# Patient Record
Sex: Female | Born: 1990 | Race: White | Hispanic: No | Marital: Married | Smoking: Current every day smoker
Health system: Southern US, Community
[De-identification: ages and names within clinical notes are randomized; demographics above are authoritative.]

## PROBLEM LIST (undated history)

## (undated) ENCOUNTER — Inpatient Hospital Stay (HOSPITAL_COMMUNITY): Payer: Self-pay

## (undated) DIAGNOSIS — O039 Complete or unspecified spontaneous abortion without complication: Secondary | ICD-10-CM

## (undated) DIAGNOSIS — M797 Fibromyalgia: Secondary | ICD-10-CM

## (undated) DIAGNOSIS — D649 Anemia, unspecified: Secondary | ICD-10-CM

## (undated) DIAGNOSIS — F32A Depression, unspecified: Secondary | ICD-10-CM

## (undated) DIAGNOSIS — E063 Autoimmune thyroiditis: Secondary | ICD-10-CM

## (undated) DIAGNOSIS — R51 Headache: Secondary | ICD-10-CM

## (undated) DIAGNOSIS — G629 Polyneuropathy, unspecified: Secondary | ICD-10-CM

## (undated) DIAGNOSIS — I341 Nonrheumatic mitral (valve) prolapse: Secondary | ICD-10-CM

## (undated) DIAGNOSIS — K831 Obstruction of bile duct: Secondary | ICD-10-CM

## (undated) DIAGNOSIS — O26649 Intrahepatic cholestasis of pregnancy, unspecified trimester: Secondary | ICD-10-CM

## (undated) DIAGNOSIS — N189 Chronic kidney disease, unspecified: Secondary | ICD-10-CM

## (undated) DIAGNOSIS — E039 Hypothyroidism, unspecified: Secondary | ICD-10-CM

## (undated) DIAGNOSIS — M069 Rheumatoid arthritis, unspecified: Secondary | ICD-10-CM

## (undated) DIAGNOSIS — O26619 Liver and biliary tract disorders in pregnancy, unspecified trimester: Secondary | ICD-10-CM

## (undated) DIAGNOSIS — R300 Dysuria: Secondary | ICD-10-CM

## (undated) DIAGNOSIS — F419 Anxiety disorder, unspecified: Secondary | ICD-10-CM

## (undated) DIAGNOSIS — F329 Major depressive disorder, single episode, unspecified: Secondary | ICD-10-CM

## (undated) HISTORY — DX: Dysuria: R30.0

## (undated) HISTORY — DX: Anemia, unspecified: D64.9

## (undated) HISTORY — DX: Chronic kidney disease, unspecified: N18.9

## (undated) HISTORY — DX: Autoimmune thyroiditis: E06.3

## (undated) HISTORY — PX: DILATION AND CURETTAGE OF UTERUS: SHX78

## (undated) HISTORY — DX: Headache: R51

## (undated) HISTORY — DX: Polyneuropathy, unspecified: G62.9

## (undated) HISTORY — DX: Anxiety disorder, unspecified: F41.9

## (undated) HISTORY — DX: Nonrheumatic mitral (valve) prolapse: I34.1

## (undated) HISTORY — DX: Complete or unspecified spontaneous abortion without complication: O03.9

## (undated) HISTORY — DX: Hypothyroidism, unspecified: E03.9

---
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
## 2010-01-01 ENCOUNTER — Inpatient Hospital Stay (HOSPITAL_COMMUNITY): Admission: AD | Admit: 2010-01-01 | Discharge: 2010-01-01 | Payer: Self-pay | Admitting: Obstetrics & Gynecology

## 2010-01-01 ENCOUNTER — Ambulatory Visit: Payer: Self-pay | Admitting: Advanced Practice Midwife

## 2010-01-01 ENCOUNTER — Encounter: Payer: Self-pay | Admitting: Obstetrics & Gynecology

## 2010-01-01 ENCOUNTER — Inpatient Hospital Stay (HOSPITAL_COMMUNITY): Admission: AD | Admit: 2010-01-01 | Discharge: 2010-01-03 | Payer: Self-pay | Admitting: Obstetrics & Gynecology

## 2010-02-12 ENCOUNTER — Ambulatory Visit: Payer: Self-pay | Admitting: Obstetrics and Gynecology

## 2010-02-19 ENCOUNTER — Ambulatory Visit: Payer: Self-pay | Admitting: Obstetrics and Gynecology

## 2010-02-26 ENCOUNTER — Encounter (INDEPENDENT_AMBULATORY_CARE_PROVIDER_SITE_OTHER): Payer: Self-pay | Admitting: *Deleted

## 2010-03-12 ENCOUNTER — Ambulatory Visit: Payer: Self-pay | Admitting: Obstetrics and Gynecology

## 2010-03-12 ENCOUNTER — Encounter: Payer: Self-pay | Admitting: Family Medicine

## 2010-03-13 ENCOUNTER — Emergency Department (HOSPITAL_COMMUNITY): Admission: EM | Admit: 2010-03-13 | Discharge: 2010-03-13 | Payer: Self-pay | Admitting: Emergency Medicine

## 2010-04-03 ENCOUNTER — Encounter: Payer: Self-pay | Admitting: Obstetrics & Gynecology

## 2010-07-27 HISTORY — PX: THERAPEUTIC ABORTION: SHX798

## 2010-08-26 NOTE — Letter (Signed)
Summary: Nadara Rasmus letter  [place] at [facility]  [address] [place], [place] [address]   Phone: [phone]  Fax: [phone]       02/26/2010 MRN: [id number]  EVANY SCHECTER [address] [place], [place]  [address]  Dear Ms. [facility] [place] and [place] announce the retirement of Arta Silence, M.D., from full-time practice at the [facility] office effective January 23, 2010 and his plans of returning part-time.  It is important to Dr. Hetty Ely and to our practice that you understand that [facility] has seven physicians in our office for your health care needs.  We will continue to offer the same exceptional care that you have today.    Dr. Hetty Ely has spoken to many of you about his plans for retirement and returning part-time in the fall.   We will continue to work with you through the transition to schedule appointments for you in the office and meet the high standards that [place] is committed to.   Again, it is with great pleasure that we share the news that Dr. Hetty Ely will return to [facility] in October of 2011 with a reduced schedule.    If you have any questions, or would like to request an appointment with one of our physicians, please call us at [phone] and press the option for Scheduling an appointment.  We take pleasure in providing you with excellent patient care and look forward to seeing you at your next office visit.  Our [facility] Physicians are:  Tillman Abide, M.D. Laurita Quint, M.D. Roxy Manns, M.D. Kerby Nora, M.D. Hannah Beat, M.D. Ruthe Mannan, M.D. We proudly welcomed Raechel Ache, M.D. and Eustaquio Boyden, M.D. to the practice in July/August 2011.  Sincerely,  [facility]

## 2010-10-10 LAB — DIFFERENTIAL
Basophils Absolute: 0 10*3/uL (ref 0.0–0.1)
Basophils Relative: 0 % (ref 0–1)
Eosinophils Absolute: 0.1 10*3/uL (ref 0.0–0.7)
Monocytes Absolute: 1.3 10*3/uL — ABNORMAL HIGH (ref 0.1–1.0)
Monocytes Relative: 11 % (ref 3–12)
Neutro Abs: 9.5 10*3/uL — ABNORMAL HIGH (ref 1.7–7.7)

## 2010-10-10 LAB — CBC
HCT: 34.3 % — ABNORMAL LOW (ref 36.0–46.0)
MCH: 25.3 pg — ABNORMAL LOW (ref 26.0–34.0)
MCHC: 32.1 g/dL (ref 30.0–36.0)
RDW: 17.5 % — ABNORMAL HIGH (ref 11.5–15.5)

## 2010-10-10 LAB — URINALYSIS, ROUTINE W REFLEX MICROSCOPIC
Ketones, ur: 15 mg/dL — AB
Nitrite: NEGATIVE
Protein, ur: 100 mg/dL — AB
Urobilinogen, UA: 1 mg/dL (ref 0.0–1.0)

## 2010-10-10 LAB — URINE CULTURE: Colony Count: NO GROWTH

## 2010-10-10 LAB — POCT I-STAT, CHEM 8
Chloride: 105 mEq/L (ref 96–112)
Creatinine, Ser: 0.7 mg/dL (ref 0.4–1.2)
Glucose, Bld: 93 mg/dL (ref 70–99)
HCT: 35 % — ABNORMAL LOW (ref 36.0–46.0)
Hemoglobin: 11.9 g/dL — ABNORMAL LOW (ref 12.0–15.0)
Potassium: 3.4 mEq/L — ABNORMAL LOW (ref 3.5–5.1)
Sodium: 138 mEq/L (ref 135–145)

## 2010-10-10 LAB — URINE MICROSCOPIC-ADD ON

## 2010-10-10 LAB — HEPATIC FUNCTION PANEL
AST: 25 U/L (ref 0–37)
Bilirubin, Direct: 0.1 mg/dL (ref 0.0–0.3)
Indirect Bilirubin: 0.9 mg/dL (ref 0.3–0.9)
Total Bilirubin: 1 mg/dL (ref 0.3–1.2)

## 2010-10-10 LAB — WET PREP, GENITAL
Clue Cells Wet Prep HPF POC: NONE SEEN
Trich, Wet Prep: NONE SEEN

## 2010-10-10 LAB — LIPASE, BLOOD: Lipase: 19 U/L (ref 11–59)

## 2010-10-10 LAB — GC/CHLAMYDIA PROBE AMP, GENITAL: Chlamydia, DNA Probe: NEGATIVE

## 2010-10-13 LAB — CBC
Hemoglobin: 9.2 g/dL — ABNORMAL LOW (ref 12.0–15.0)
MCHC: 32.8 g/dL (ref 30.0–36.0)
MCV: 73.9 fL — ABNORMAL LOW (ref 78.0–100.0)
Platelets: 203 10*3/uL (ref 150–400)
RBC: 3.8 MIL/uL — ABNORMAL LOW (ref 3.87–5.11)
RDW: 17.3 % — ABNORMAL HIGH (ref 11.5–15.5)
WBC: 18.6 10*3/uL — ABNORMAL HIGH (ref 4.0–10.5)

## 2010-10-14 LAB — DIFFERENTIAL
Basophils Absolute: 0 10*3/uL (ref 0.0–0.1)
Basophils Relative: 0 % (ref 0–1)
Neutro Abs: 7.5 10*3/uL (ref 1.7–7.7)
Neutrophils Relative %: 68 % (ref 43–77)

## 2010-10-14 LAB — POCT I-STAT, CHEM 8
Chloride: 105 mEq/L (ref 96–112)
HCT: 28 % — ABNORMAL LOW (ref 36.0–46.0)
Hemoglobin: 9.5 g/dL — ABNORMAL LOW (ref 12.0–15.0)
Potassium: 3.6 mEq/L (ref 3.5–5.1)
Sodium: 140 mEq/L (ref 135–145)

## 2010-10-14 LAB — POCT CARDIAC MARKERS
CKMB, poc: <1 ng/mL — ABNORMAL LOW (ref 1.0–8.0)
Troponin i, poc: <0.05 ng/mL (ref 0.00–0.09)

## 2010-10-14 LAB — CBC
MCHC: 34.1 g/dL (ref 30.0–36.0)
Platelets: 185 10*3/uL (ref 150–400)
RBC: 3.45 MIL/uL — ABNORMAL LOW (ref 3.87–5.11)
RDW: 15.1 % (ref 11.5–15.5)

## 2010-10-28 LAB — DIFFERENTIAL
Basophils Relative: 0 % (ref 0–1)
Lymphs Abs: 0.9 10*3/uL (ref 0.7–4.0)
Monocytes Absolute: 0.6 10*3/uL (ref 0.1–1.0)
Monocytes Relative: 4 % (ref 3–12)
Neutro Abs: 11.9 10*3/uL — ABNORMAL HIGH (ref 1.7–7.7)
Neutrophils Relative %: 89 % — ABNORMAL HIGH (ref 43–77)

## 2010-10-28 LAB — URINALYSIS, ROUTINE W REFLEX MICROSCOPIC
Bilirubin Urine: NEGATIVE
Glucose, UA: NEGATIVE mg/dL
Hgb urine dipstick: NEGATIVE
Specific Gravity, Urine: 1.017 (ref 1.005–1.030)
Urobilinogen, UA: 0.2 mg/dL (ref 0.0–1.0)

## 2010-10-28 LAB — COMPREHENSIVE METABOLIC PANEL
ALT: 11 U/L (ref 0–35)
Albumin: 4.5 g/dL (ref 3.5–5.2)
Alkaline Phosphatase: 61 U/L (ref 39–117)
BUN: 7 mg/dL (ref 6–23)
Calcium: 9.5 mg/dL (ref 8.4–10.5)
Potassium: 4.1 mEq/L (ref 3.5–5.1)
Sodium: 136 mEq/L (ref 135–145)
Total Protein: 7.9 g/dL (ref 6.0–8.3)

## 2010-10-28 LAB — CBC
MCHC: 34.8 g/dL (ref 30.0–36.0)
Platelets: 169 10*3/uL (ref 150–400)
RDW: 14.7 % (ref 11.5–15.5)

## 2010-11-02 LAB — POCT RAPID STREP A (OFFICE): Streptococcus, Group A Screen (Direct): NEGATIVE

## 2010-11-11 LAB — CBC
HCT: 29.6 % — ABNORMAL LOW (ref 36.0–49.0)
Hemoglobin: 9.6 g/dL — ABNORMAL LOW (ref 12.0–16.0)
MCV: 79 fL (ref 78.0–98.0)
Platelets: 242 10*3/uL (ref 150–400)
RBC: 3.75 MIL/uL — ABNORMAL LOW (ref 3.80–5.70)
WBC: 16.1 10*3/uL — ABNORMAL HIGH (ref 4.5–13.5)

## 2010-12-09 NOTE — Assessment & Plan Note (Signed)
Eileen [name], Eileen [name]               ACCOUNT NO.:  [id number]   MEDICAL RECORD NO.:  [id number]          PATIENT TYPE:  POB   LOCATION:  [facility] at [facility]         FACILITY:  [facility]   PHYSICIAN:  Scheryl Darter, MD       DATE OF BIRTH:  Nov 27, 1990   DATE OF SERVICE:  08/13/2009                                  CLINIC NOTE   The patient comes to the office today for consultation on her migraine  headaches.  The patient is currently 19 weeks and 4 days pregnant.  She  currently has a 68-year-old daughter.  She did have the Mirena IUD which  fell out and she became pregnant without, and then she had an unplanned  pregnancy.  She has had migraines for years.  She does have sensitivity  to lights sounds, smells.  She does have nausea, no vomiting.  She does  feel dizzy with her headaches.  She does not have aura.  Her mother also  has headaches.  She in the last month has been having almost daily  headache.  She has approximately 8 severe headaches, 12 moderate  headaches, and 8 mild headaches.  She did call this office earlier in  the week and was given a prescription for Fioricet and Phenergan.  She  has taken both of these several times with not much help.  She has also  taken Tylenol as well as sinus Tylenol without much help.  She does have  a past medical history of having had a goiter.  Her thyroid is normal  now.  Her triggers seem to be predominantly stress.  She is currently  unemployed.  Her boyfriend is unemployed.  They live with his family  which consists of his parents and 3 sisters as well as the patient and  her boyfriend and her 56-year-old daughter.  The 83-year-old daughter has  another father.  She and this new partner have only been together since  June.  Again, both are unemployed so there is financial difficulties for  both of them.  She had a somewhat dysfunctional childhood growing up  parents divorced and reconciled and she was moved back and forth between  [place] and [place].  She feels that once she delivers this baby,  she will be moving to [place] to live with her father.   ASSESSMENT:  Migraine without aura and pregnancy.   PLAN:  We had a very lengthy discussion today approximately 1 hour.  We  had a deep discussion weighing and balancing the cost benefit ratio of  medications in pregnancy.  The patient is aware that there are risks in  taking medications in pregnancy and she is willing at this point to take  the risks.  What we have decided for now is that, she will take her  Phenergan every night for 7 nights this may help her break her headache  cycle.  She will also be given Imitrex 100 mg one p.o. p.r.n. migraine  okay to repeat once in 2 hours, #9 with 2 refills.  She will take that  as needed for the severe migraines only and use the Fioricet that she  has  been prescribed for rescue.  We did talk at length about ways in  which to decrease her stress level and she may consider finding a Arts development officer or counselor to help her with some of her overwhelming life  issues.  She will return in 2 weeks and we will evaluate this plan.      [name], NP    ______________________________  Scheryl Darter, MD    LR/MEDQ  D:  08/13/2009  T:  08/14/2009  Job:  [id number]

## 2010-12-09 NOTE — Assessment & Plan Note (Signed)
NAMETEANNA, ELEM               ACCOUNT NO.:  [id number]   MEDICAL RECORD NO.:  [id number]          PATIENT TYPE:  POB   LOCATION:  [facility]         FACILITY[facility]  [facility]   PHYSICIAN:  Argentina Donovan, MD        DATE OF BIRTH:  10-21-1990   DATE OF SERVICE:  10/11/2008                                  CLINIC NOTE   The patient is an 20 year old Caucasian female who had a normal  spontaneous delivery on September 01, 2008, with a female infant named,  Harlow Ohms, and all went well.  She is in for her postpartum checkup and has  no complaints.  She was hypothyroid and on Synthroid during her  pregnancy, but has stopped all her vitamins and thyroid since then.  We  are going to run thyroid profile as well as a CBC today to see whether  she needs to continue on vitamins with iron and/or her thyroid  medication, which she said she did not need prior to getting pregnant.  She has chosen Civil [employer] fast streamer as a method of birth control and we will insert  that in 1 week.  We discussed it with her.  We have talked about the  possible complications and side effects stressing those that might even  cause need for removal like severe cramping, uterine perforation, acne,  and weight gain.  In addition, the patient has no postpartum complaints.   PHYSICAL EXAMINATION:  VITAL SIGNS:  Her blood pressure is 102/61.  She  weighs 115 pounds and she is 59 inches tall.  GENERAL:  A well-developed, well-nourished Caucasian female in no acute  distress.  ABDOMEN:  Soft, flat, and nontender.  No masses or organomegaly.  PELVIC:  External genitalia is normal.  BUS is within normal limits.  Vagina is clean and well rugated with no sign of tears or healing.  The  cervix is clean, parous.  The uterus is well involuted, anterior, normal  size, shape, and consistency.  The adnexa is normal.   The patient is not nursing and has not nursed since the birth of the  baby, has no milk at the present time.   IMPRESSION:  Normal  postpartum exam.   PLAN:  Future insertion of Mirena IUD and to reevaluate her thyroid and  iron requirements.            ______________________________  Argentina Donovan, MD     PR/MEDQ  D:  10/11/2008  T:  10/11/2008  Job:  [id number]

## 2010-12-09 NOTE — Assessment & Plan Note (Signed)
Eileen [name], Eileen [name]               ACCOUNT NO.:  [id number]   MEDICAL RECORD NO.:  [id number]          PATIENT TYPE:  POB   LOCATION:  [facility]         FACILITY:  [facility]   PHYSICIAN:  Catalina Antigua, MD     DATE OF BIRTH:  15-Apr-1991   DATE OF SERVICE:                                  CLINIC NOTE   This is a 20 year old G2, P2 who is status post vaginal delivery on January 01, 2010, who presents today for postpartum visit.  The patient is  currently doing well and without any complaints.  She is bottle feeding  and has not resumed her menses yet.  The patient is receiving ample help  from her mother and has no concerns regarding signs and symptoms of  postpartum depression.  The patient plans on using the Implanon for  birth control.   PHYSICAL EXAMINATION:  VITAL SIGNS:  Her blood pressure is 97/70, pulse  of 94, weight of 105 pounds, height of 60 inches.  BREASTS:  Nontender.  No engorgement.  No palpable masses.  ABDOMEN:  Soft, nontender, nondistended.  PELVIC:  She had normal vaginal mucosa, normal appearing cervix, as well  anteverted uterus.  No palpable adnexal masses or tenderness and she  scored a 7 on the depression scale.   ASSESSMENT AND PLAN:  This is a 20 year old G2, P2 who is here for an  postpartum exam status post vaginal delivery on January 01, 2010.  The  patient is cleared to resume all previous physical activity.  The  patient plans on using the Implanon for birth control.  The patient will  return next week for Implanon insertion.  The patient is otherwise is  due for physical exam in November.  The patient was advised in the  meantime to abstain from sexual activity until Implanon is placed or to  use condoms for birth control.           ______________________________  Catalina Antigua, MD     PC/MEDQ  D:  02/12/2010  T:  02/12/2010  Job:  [id number]

## 2010-12-09 NOTE — Assessment & Plan Note (Signed)
Eileen [name], Eileen [name]               ACCOUNT NO.:  [id number]   MEDICAL RECORD NO.:  [id number]          PATIENT TYPE:  POB   LOCATION:  [facility]         FACILITY:  [facility]   PHYSICIAN:  Elsie Lincoln, MD      DATE OF BIRTH:  April 27, 1991   DATE OF SERVICE:  04/11/2007                                  CLINIC NOTE   The patient is a 20 year old female who has two complaints.  She has  urinary tract infection that was treated with Cipro and number two is  she is complaining of increased vaginal bleeding with the Implanon. Her  mother thinks that she is also very moody with the Implanon. She has  also had worsening headaches.  She wants the Implanon taken out and  wants to go on Yaz. This might be good if she is having some PMS type  symptoms.   I could feel the Implanon very easily.   We will start her on the Yaz now and take the implant out in a month so  that she has good pregnancy protection.  The patient uses condoms at  every sexual encounter and she denies being tested for STDs at this  time. The patient is to come back in a month for Implanon removal.           ______________________________  Elsie Lincoln, MD     KL/MEDQ  D:  04/11/2007  T:  04/11/2007  Job:  [id number]

## 2010-12-09 NOTE — Assessment & Plan Note (Signed)
Eileen [name], Eileen [name]               ACCOUNT NO.:  [id number]   MEDICAL RECORD NO.:  [id number]          PATIENT TYPE:  POB   LOCATION:  [facility]         FACILITY:  [facility]   PHYSICIAN:  Tinnie Gens, MD        DATE OF BIRTH:  1990/08/11   DATE OF SERVICE:  08/16/2007                                  CLINIC NOTE   CHIEF COMPLAINT:  Yearly exam.   PRESENT ILLNESS:  The patient is a 20 year old nulligravida who has been  sexually active for the past 3 years. She comes in today needing a Pap  smear.  This will not be her first Pap smear.  She had one with Dr.  Mia Creek several years ago.  The patient has been on several  contraceptives including NuvaRing and Implanon which have not worked.  She is now on Yasmin which she is remembering to take daily she states.  She continues to have normal cycles that are light in nature.  She has a  little bit of cramping with her cycles but nothing unbearable.  The  patient is not currently sexually active and she is completing the  Gardisil series.  The patient has several piercings and 1 tattoo of  note.   PAST MEDICAL HISTORY:  Significant for hypothyroidism which is followed  by a primary care physician and possibly an endocrinologist. She has not  required medication at this time.   PAST SURGICAL HISTORY:  Negative.   MEDICATIONS:  Yasmin 1 p.o. daily.   ALLERGIES:  Sulfa.   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  She wishes to go to college and study to be a  Corporate investment banker.  She denies tobacco, alcohol or drug use at this time.   FAMILY HISTORY:  Her mom has endometriosis and her other immediate  family is healthy.  She reports her grandfather is deceased, she is  unsure of why.   REVIEW OF SYSTEMS:  She denies chest pain, shortness of breath, nausea,  vomiting, diarrhea, constipation, trouble with urination or headaches.  The patient denies lumps in her breast.  She does  self-breast exam  monthly.  She denies vaginal discharge or abnormal uterine bleeding.   PHYSICAL EXAMINATION:  VITALS:  On exam her blood pressure is 119/65,  weight 109, pulse 79, height 4 feet 11 and 1/2 inches.  GENERAL:  She is a well-developed, well-nourished female in no acute  distress.  HEENT:  Normocephalic, atraumatic.  Sclerae anicteric.  NECK:  Supple.  Normal thyroid.  LUNGS:  Clear bilaterally.  CV:  Regular rate and rhythm without murmurs, gallops or murmurs.  ABDOMEN:  Soft, nontender, nondistended.  EXTREMITIES:  No cyanosis, clubbing or edema.  She has an umbilical  piercing and a star tattoo in the left lower quadrant  on her abdomen.  Extremities has had no cyanosis, clubbing or edema.  BREASTS:  Symmetric with everted nipples.  No masses.  No  supraclavicular or axillary adenopathy.  GU:  Normal external female genitalia.  BUS is normal.  Vagina is pink  and rugated.  Cervix is nulliparous and small. Uterus  is small,  anteverted.  No adnexal mass or tenderness.   IMPRESSION:  1. Yearly exam.  2. High-risk sexual activity.  3. Sexually active x 3 years.   PLAN:  1. Pap smear today.  2. GC chlamydia, HIV, RPR today.  3. Continue Yaz.  4. Advice given about accidents, substance use, staying in school,      seatbelt wearing, safe sex given to the patient.  5. Complete Gardisil.           ______________________________  Tinnie Gens, MD     TP/MEDQ  D:  08/16/2007  T:  08/16/2007  Job:  [id number]

## 2010-12-09 NOTE — Assessment & Plan Note (Signed)
NAMEEVERETT, RICCIARDELLI               ACCOUNT NO.:  [id number]   MEDICAL RECORD NO.:  [id number]          PATIENT TYPE:  POB   LOCATION:  [facility]         FACILITY:  [facility]   PHYSICIAN:  Johnella Moloney, MD        DATE OF BIRTH:  1991/04/17   DATE OF SERVICE:  11/13/2008                                  CLINIC NOTE   The patient comes to office today for an STD screening.  She has a new  partner and feels that she should be checked.  She is not having any  symptoms.  She denies any vaginal discharge or odor.  She would also  like to have her IUD strings checked.  She did have an IUD placed on  October 16, 2008, by Dr. Silas Flood.  She has not had any problems with it,  other than she has had some ongoing spotting.   PHYSICAL EXAMINATION:  GENERAL:  Well-developed, well-nourished 20-year-  old Caucasian female in no acute distress.  VITAL SIGNS:  Blood pressure is 125/79, weight is 107, pulse is 114.  GU:  Genitalia externally, there is no lesions or discharges.  Internally, there is a small amount of blood in the vault.  Cervix is  closed.  IUD strings are present.  Bimanual exam, no cervical motion  tenderness.  No adnexal mass.   ASSESSMENT:  1. Screening for sexually transmitted disease.  We will check for      gonorrhea and chlamydia today.  2. Contraception.  The patient's intrauterine (contraceptive) device      strings are in place.  She will return in March 2011 for her annual      exam or sooner if she has any problems.      [name], NP    ______________________________  Johnella Moloney, MD    LR/MEDQ  D:  11/13/2008  T:  11/14/2008  Job:  [id number]

## 2010-12-09 NOTE — Assessment & Plan Note (Signed)
NAMELATIQUA, Eileen [name]               ACCOUNT NO.:  [id number]   MEDICAL RECORD NO.:  [id number]          PATIENT TYPE:  POB   LOCATION:  [facility]         FACILITY:  [facility]   PHYSICIAN:  Catalina Antigua, MD     DATE OF BIRTH:  1991/06/23   DATE OF SERVICE:  03/12/2010                                  CLINIC NOTE   This is a 20 year old G2, P2 who presents today for evaluation of UTI  symptoms.  The patient states that she has been experiencing some  dysuria and suprapubic pain since last Saturday and is now experiencing  some lower back pain.  The patient states that she did not come sooner  or she thought that her symptoms were improving that she start  experiencing some low-grade temperatures yesterday evening.  The patient  has also been complaining of a headache since last night for which she  did not take any pain for.  Says that this is the first time she has had  such a bad headache.  The patient also states that she is still not  getting adequate sleep secondary to her newborn, but is receiving ample  help from her mother.  The patient's third complaint today is persistent  bleeding since she started her cycle 4 days ago.  The patient is status  post Implanon placement on February 19, 2010, and started her first menses.  Since the Implanon was placed, the patient states she has been soaking  through a tampon approximately every hour or so.  Denies chest pain,  shortness of breath, lightheadedness, dizziness, or heart palpitation.   PHYSICAL EXAMINATION:  VITAL SIGNS:  Her blood pressure is 114/70, pulse  of 116, weight of 100 pounds, height of 5 feet, temperature is 100.3.  LUNGS:  Clear to auscultation bilaterally.  HEART:  Normal S1, S2.  ABDOMEN:  Soft, nondistended, mild suprapubic tenderness.  She had mild  CVA tenderness on the right side and her left arm gained Implanon.  Insertion site is nicely healed.  No redness, erythema, induration, or  abnormal discharge.  A  urinalysis showed 3+ leukocyte, positive nitrate,  and 3+ blood.   ASSESSMENT AND PLAN:  This is a 20 year old G2, P2 who is status post  Implanon placement on February 19, 2010, who presents today for evaluation  of urinary tract infection.  Prescription for Keflex was provided.  Urine cultures were sent to determine sensitivities.  The patient was  advised to take Tylenol to aid with her headache as well as general  myalgias associated with fevers.  The patient was also re-educated  regarding the irregular bleeding as well as the spotting associated with  Implanon.  The patient was advised to wait another cycle or 2 before  making definitive decisions regarding her current birth control method.  The patient was advised to contact the office or present to the  emergency department if her symptoms did not improve in a couple of  days.           ______________________________  Catalina Antigua, MD     PC/MEDQ  D:  03/12/2010  T:  03/12/2010  Job:  [id number]

## 2011-04-28 LAB — URINALYSIS, ROUTINE W REFLEX MICROSCOPIC
Bilirubin Urine: NEGATIVE
Glucose, UA: NEGATIVE
Specific Gravity, Urine: 1.01
Urobilinogen, UA: 0.2
pH: 7.5

## 2011-04-28 LAB — URINE MICROSCOPIC-ADD ON

## 2011-08-04 DIAGNOSIS — O039 Complete or unspecified spontaneous abortion without complication: Secondary | ICD-10-CM

## 2011-08-04 DIAGNOSIS — IMO0001 Reserved for inherently not codable concepts without codable children: Secondary | ICD-10-CM

## 2011-08-04 HISTORY — DX: Reserved for inherently not codable concepts without codable children: IMO0001

## 2011-08-04 HISTORY — DX: Complete or unspecified spontaneous abortion without complication: O03.9

## 2011-08-19 IMAGING — US US TRANSVAGINAL NON-OB
1 series · 13 of 25 positions shown · non-contrast
Comparison: None

CLINICAL DATA: Assess IUD placement.  LMP 03/29/2009.  The patient
reports she has had a positive pregnancy test, but we were unable
to reach the office to confirm this. The study was subsequently
conducted as ordered as a non-OB pelvic exam.

TRANSVAGINAL ULTRASOUND OF PELVIS
TECHNIQUE: Transvaginal ultrasound examination of the pelvis was
performed including evaluation of the uterus, ovaries, adnexal
regions, and pelvic cul-de-sac.

[Series 1: us transvaginal non-ob · 0.12mm/px · 13 of 29 slices shown]
[im 1/29]
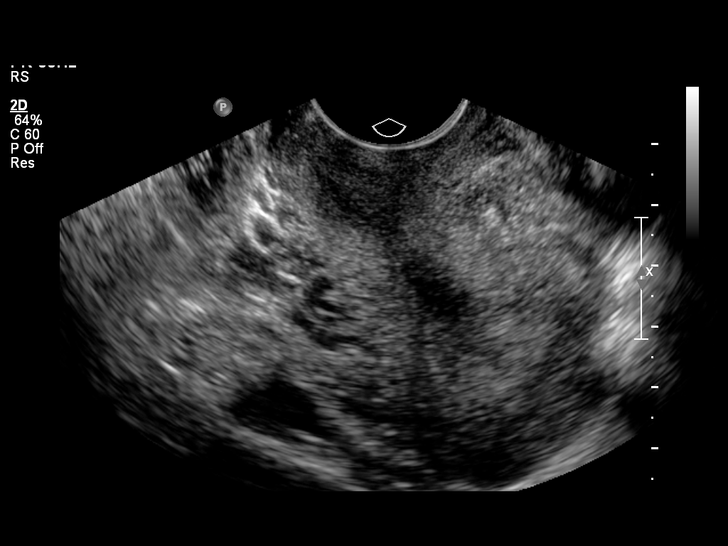
[im 3/29]
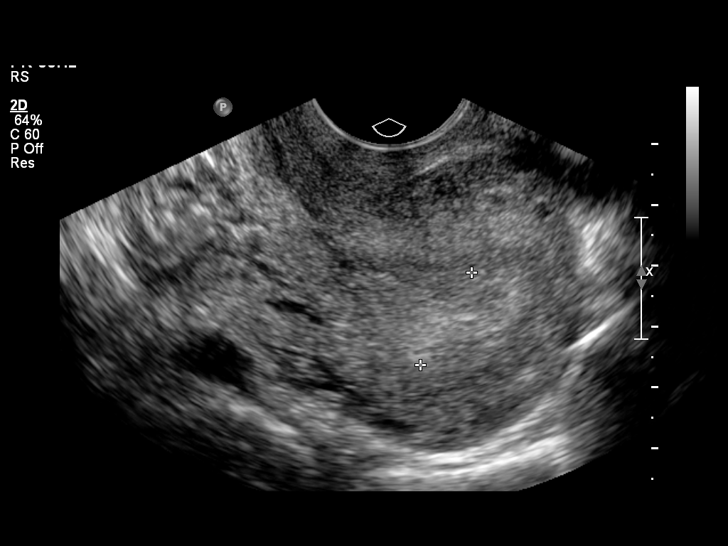
[im 5/29]
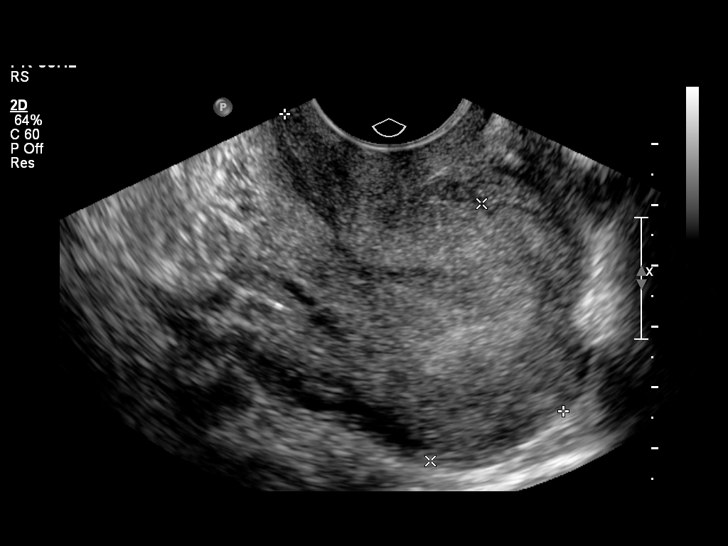
[im 8/29]
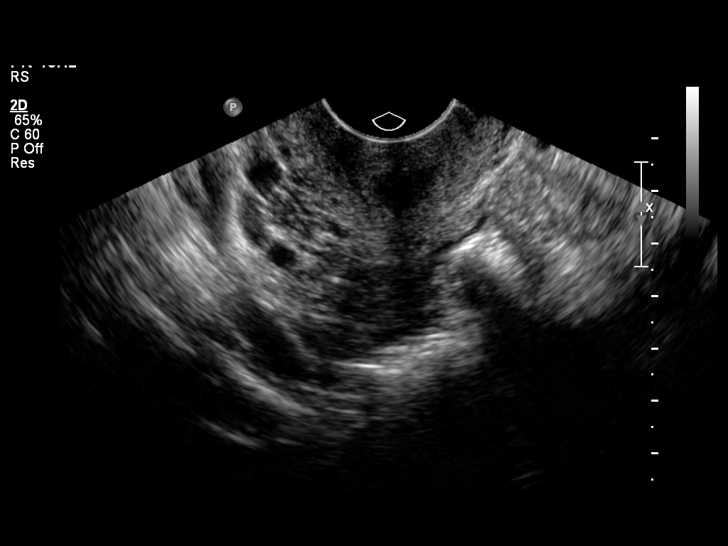
[im 10/29]
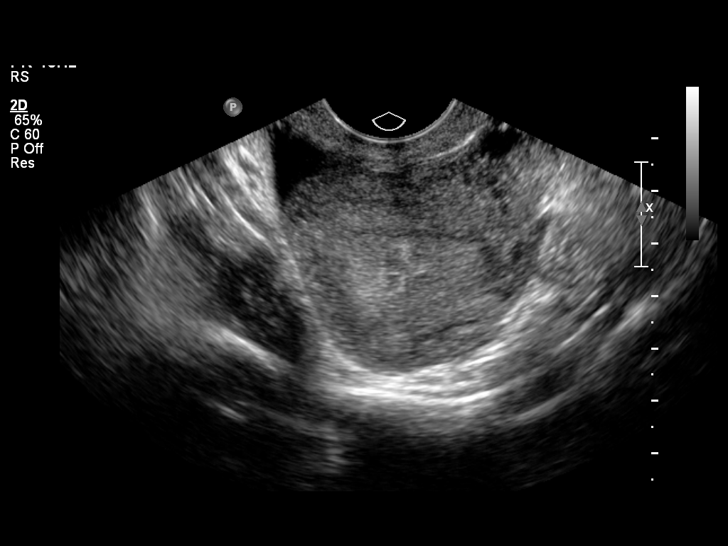
[im 12/29]
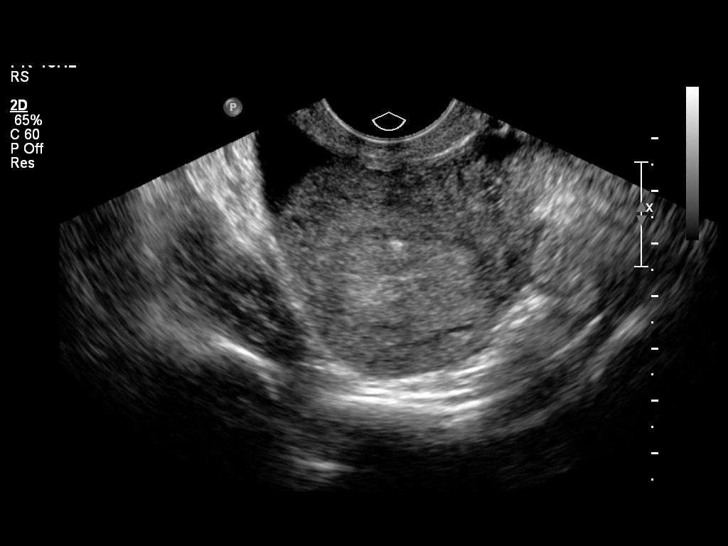
[im 15/29]
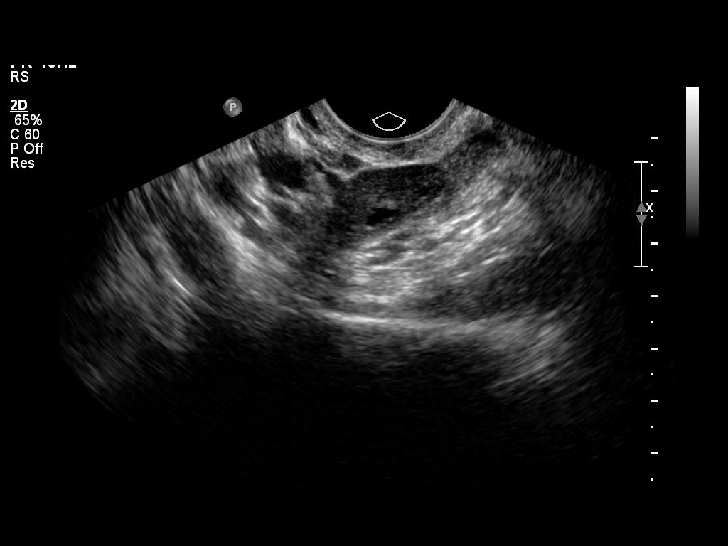
[im 17/29]
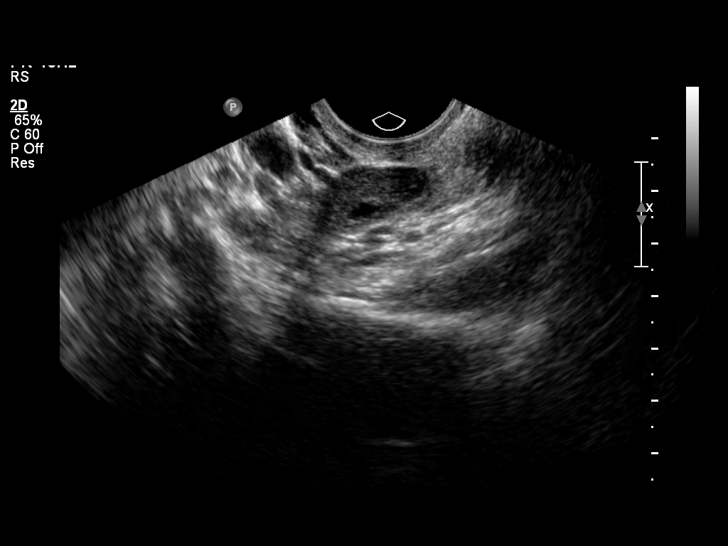
[im 19/29]
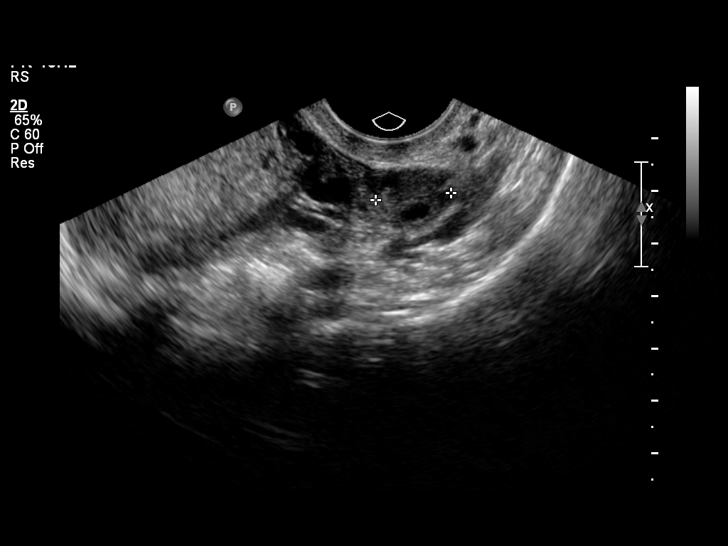
[im 22/29]
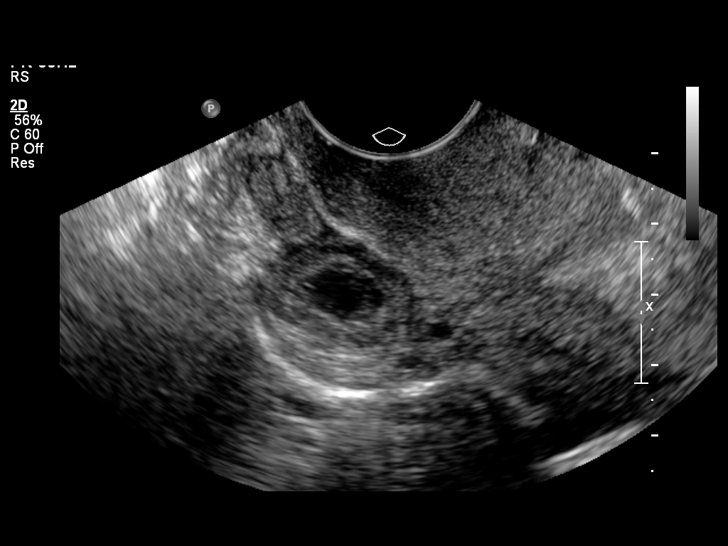
[im 24/29]
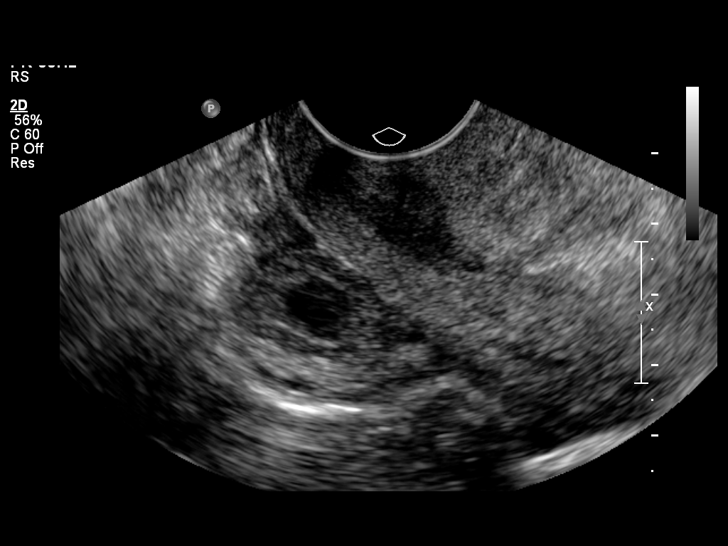
[im 26/29]
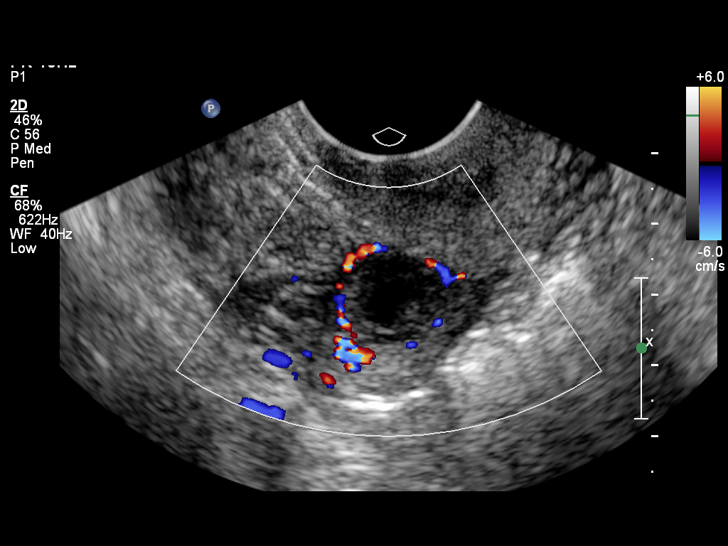
[im 29/29]
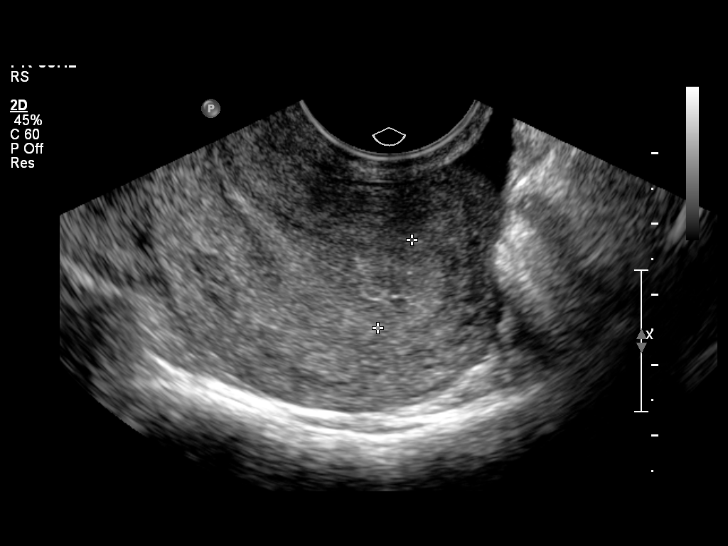

[13 of 25 positions shown; findings below may reference images not displayed]

FINDINGS: Uterus the uterus is retroverted and demonstrates a sagittal length
of 6.7 cm, an AP width of 4.3 cm and a transverse width of 4.6 cm.
A homogeneous uterine myometrium is seen.

Endometrium no evidence for an IUD is seen within the endometrial
canal.  The endometrial lining is homogeneously echogenic with an
AP width of 1.3 cm.  No areas of focal thickening or irregularity
are apparent.  This degree of endometrial thickening can be seen
with either a presecretory endometrial stripe or an early
sonographically silent pregnancy.

Right Ovary measures 2.6 x 1.9 x 2.1 cm and contains a corpus
luteum.

Left Ovary 1.4 x 2.4 x 1.2 cm and has a normal appearance

Other Findings:  A small amount of simple free fluid is noted in
the cul-de-sac.
IMPRESSION: No evidence for IUD is noted within the endometrial lining.

## 2011-11-14 ENCOUNTER — Emergency Department (HOSPITAL_COMMUNITY)
Admission: EM | Admit: 2011-11-14 | Discharge: 2011-11-14 | Disposition: A | Discharge status: Home Health | Attending: Emergency Medicine | Admitting: Emergency Medicine

## 2011-11-14 ENCOUNTER — Encounter (HOSPITAL_COMMUNITY): Payer: Self-pay | Admitting: Family Medicine

## 2011-11-14 DIAGNOSIS — N72 Inflammatory disease of cervix uteri: Secondary | ICD-10-CM

## 2011-11-14 DIAGNOSIS — B9689 Other specified bacterial agents as the cause of diseases classified elsewhere: Secondary | ICD-10-CM | POA: Insufficient documentation

## 2011-11-14 DIAGNOSIS — A499 Bacterial infection, unspecified: Secondary | ICD-10-CM | POA: Insufficient documentation

## 2011-11-14 DIAGNOSIS — N760 Acute vaginitis: Secondary | ICD-10-CM | POA: Insufficient documentation

## 2011-11-14 LAB — WET PREP, GENITAL
Clue Cells Wet Prep HPF POC: NONE SEEN
Trich, Wet Prep: NONE SEEN
Yeast Wet Prep HPF POC: NONE SEEN

## 2011-11-14 LAB — URINALYSIS, ROUTINE W REFLEX MICROSCOPIC
Bilirubin Urine: NEGATIVE
Glucose, UA: NEGATIVE mg/dL
Hgb urine dipstick: NEGATIVE
Ketones, ur: NEGATIVE mg/dL
Nitrite: NEGATIVE
Protein, ur: NEGATIVE mg/dL
Specific Gravity, Urine: 1.005 (ref 1.005–1.030)
Urobilinogen, UA: 0.2 mg/dL (ref 0.0–1.0)
pH: 7.5 (ref 5.0–8.0)

## 2011-11-14 LAB — URINE MICROSCOPIC-ADD ON

## 2011-11-14 LAB — PREGNANCY, URINE: Preg Test, Ur: NEGATIVE

## 2011-11-14 MED ORDER — LIDOCAINE HCL 1 % IJ SOLN
INTRAMUSCULAR | Status: AC
  Administered 2011-11-14: 05:00:00
  Filled 2011-11-14: qty 20

## 2011-11-14 MED ORDER — METRONIDAZOLE 500 MG PO TABS: 500.0000 mg | ORAL_TABLET | Freq: Two times a day (BID) | ORAL | Status: AC

## 2011-11-14 MED ORDER — CEFTRIAXONE SODIUM 250 MG IJ SOLR
250.0000 mg | Freq: Once | INTRAMUSCULAR | Status: AC
  Administered 2011-11-14: 250 mg via INTRAMUSCULAR
  Filled 2011-11-14: qty 250

## 2011-11-14 MED ORDER — AZITHROMYCIN 1 G PO PACK
1.0000 g | PACK | Freq: Once | ORAL | Status: AC
  Administered 2011-11-14: 1 g via ORAL
  Filled 2011-11-14: qty 1

## 2011-11-14 NOTE — ED Provider Notes (Signed)
Medical screening examination/treatment/procedure(s) were performed by non-physician practitioner and as supervising physician I was immediately available for consultation/collaboration.   Gavin Pound. Oletta Lamas, MD 11/14/11 [id number]

## 2011-11-14 NOTE — ED Provider Notes (Signed)
History     CSN: [id number]  Arrival date & time 11/14/11  0203   First MD Initiated Contact with Patient 11/14/11 0315      Chief Complaint  Patient presents with  . Vaginal Discharge    (Consider location/radiation/quality/duration/timing/severity/associated sxs/prior treatment) HPI Comments: Patient with a history of PID presents with a several day history of worsening vaginal discharge - states the discharge is thick, white and foul smelling - denies abdominal pain, dysuria, pregnancy, vaginal bleeding.  States LMP was on the 6th and normal.  Denies fever, chills, nausea, vomiting.  States has had history of BV in the past and this feels like that.  States is sexually active without protection with same partner for a year.    Patient is a 21 y.o. female presenting with vaginal discharge. The history is provided by the patient. No language interpreter was used.  Vaginal Discharge This is a new problem. The current episode started in the past 7 days. The problem occurs constantly. The problem has been unchanged. Pertinent negatives include no abdominal pain, anorexia, arthralgias, change in bowel habit, chest pain, chills, congestion, coughing, diaphoresis, fatigue, fever, headaches, joint swelling, myalgias, nausea, neck pain, numbness, rash, sore throat, swollen glands, urinary symptoms, vertigo, visual change, vomiting or weakness. The symptoms are aggravated by nothing. She has tried nothing for the symptoms. The treatment provided no relief.    History reviewed. No pertinent past medical history.  History reviewed. No pertinent past surgical history.  History reviewed. No pertinent family history.  History  Substance Use Topics  . Smoking status: Current Everyday Smoker -- 0.5 packs/day  . Smokeless tobacco: Not on file  . Alcohol Use: Yes     occasional    OB History    Grav Para Term Preterm Abortions TAB SAB Ect Mult Living                  Review of Systems    Constitutional: Negative for fever, chills, diaphoresis and fatigue.  HENT: Negative for congestion, sore throat and neck pain.   Respiratory: Negative for cough.   Cardiovascular: Negative for chest pain.  Gastrointestinal: Negative for nausea, vomiting, abdominal pain, anorexia and change in bowel habit.  Genitourinary: Positive for vaginal discharge. Negative for dysuria, vaginal bleeding, vaginal pain and pelvic pain.  Musculoskeletal: Negative for myalgias, joint swelling and arthralgias.  Skin: Negative for rash.  Neurological: Negative for vertigo, weakness, numbness and headaches.  All other systems reviewed and are negative.    Allergies  Red dye and Sulfa antibiotics  Home Medications  No current outpatient prescriptions on file.  BP 104/72  Pulse 84  Temp(Src) 98.7 F (37.1 C) (Oral)  Resp 18  Wt 108 lb (48.988 kg)  SpO2 100%  LMP 10/31/2011  Physical Exam  Nursing note and vitals reviewed. Constitutional: She is oriented to person, place, and time. She appears well-developed and well-nourished. No distress.  HENT:  Head: Normocephalic and atraumatic.  Right Ear: External ear normal.  Left Ear: External ear normal.  Nose: Nose normal.  Mouth/Throat: Oropharynx is clear and moist. No oropharyngeal exudate.  Eyes: Conjunctivae are normal. Pupils are equal, round, and reactive to light. No scleral icterus.  Neck: Normal range of motion. Neck supple.  Cardiovascular: Normal rate, regular rhythm and normal heart sounds.  Exam reveals no gallop and no friction rub.   No murmur heard. Pulmonary/Chest: Effort normal and breath sounds normal. No respiratory distress. She has no wheezes. She has no rales. She  exhibits no tenderness.  Abdominal: Soft. Bowel sounds are normal. She exhibits no distension and no mass. There is no tenderness. There is no rebound and no guarding.  Genitourinary: There is no rash or tenderness on the right labia. There is no rash or tenderness  on the left labia. Uterus is not enlarged and not tender. Cervix exhibits no motion tenderness, no discharge and no friability. Right adnexum displays no mass and no tenderness. Left adnexum displays no mass and no tenderness. No erythema or tenderness around the vagina. Vaginal discharge found.  Musculoskeletal: Normal range of motion. She exhibits no edema and no tenderness.  Lymphadenopathy:    She has no cervical adenopathy.  Neurological: She is alert and oriented to person, place, and time. No cranial nerve deficit.  Skin: Skin is warm and dry. No rash noted. No erythema. No pallor.  Psychiatric: She has a normal mood and affect. Her behavior is normal. Judgment and thought content normal.    ED Course  Procedures (including critical care time)   Labs Reviewed  GC/CHLAMYDIA PROBE AMP, GENITAL  WET PREP, GENITAL  URINALYSIS, ROUTINE W REFLEX MICROSCOPIC  PREGNANCY, URINE   No results found. Results for orders placed during the hospital encounter of 11/14/11  WET PREP, GENITAL      Component Value Range   Yeast Wet Prep HPF POC NONE SEEN  NONE SEEN    Trich, Wet Prep NONE SEEN  NONE SEEN    Clue Cells Wet Prep HPF POC NONE SEEN  NONE SEEN    WBC, Wet Prep HPF POC FEW (*) NONE SEEN   URINALYSIS, ROUTINE W REFLEX MICROSCOPIC      Component Value Range   Color, Urine YELLOW  YELLOW    APPearance CLEAR  CLEAR    Specific Gravity, Urine 1.005  1.005 - 1.030    pH 7.5  5.0 - 8.0    Glucose, UA NEGATIVE  NEGATIVE (mg/dL)   Hgb urine dipstick NEGATIVE  NEGATIVE    Bilirubin Urine NEGATIVE  NEGATIVE    Ketones, ur NEGATIVE  NEGATIVE (mg/dL)   Protein, ur NEGATIVE  NEGATIVE (mg/dL)   Urobilinogen, UA 0.2  0.0 - 1.0 (mg/dL)   Nitrite NEGATIVE  NEGATIVE    Leukocytes, UA TRACE (*) NEGATIVE   PREGNANCY, URINE      Component Value Range   Preg Test, Ur NEGATIVE  NEGATIVE   URINE MICROSCOPIC-ADD ON      Component Value Range   Squamous Epithelial / LPF FEW (*) RARE    WBC, UA 0-2   <3 (WBC/hpf)   Bacteria, UA RARE  RARE    No results found.    Cervicitis    MDM  Patient treated for cervicitis - will also treat for BV though there are no clue cells, no yeast there.  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        Izola Price [place], [place] 11/14/11 [phone]

## 2011-11-14 NOTE — Discharge Instructions (Signed)
Bacterial Vaginosis Bacterial vaginosis (BV) is a vaginal infection where the normal balance of bacteria in the vagina is disrupted. The normal balance is then replaced by an overgrowth of certain bacteria. There are several different kinds of bacteria that can cause BV. BV is the most common vaginal infection in women of childbearing age. CAUSES   The cause of BV is not fully understood. BV develops when there is an increase or imbalance of harmful bacteria.   Some activities or behaviors can upset the normal balance of bacteria in the vagina and put women at increased risk including:   Having a new sex partner or multiple sex partners.   Douching.   Using an intrauterine device (IUD) for contraception.   It is not clear what role sexual activity plays in the development of BV. However, women that have never had sexual intercourse are rarely infected with BV.  Women do not get BV from toilet seats, bedding, swimming pools or from touching objects around them.  SYMPTOMS   Grey vaginal discharge.   A fish-like odor with discharge, especially after sexual intercourse.   Itching or burning of the vagina and vulva.   Burning or pain with urination.   Some women have no signs or symptoms at all.  DIAGNOSIS  Your caregiver must examine the vagina for signs of BV. Your caregiver will perform lab tests and look at the sample of vaginal fluid through a microscope. They will look for bacteria and abnormal cells (clue cells), a pH test higher than 4.5, and a positive amine test all associated with BV.  RISKS AND COMPLICATIONS   Pelvic inflammatory disease (PID).   Infections following gynecology surgery.   Developing HIV.   Developing herpes virus.  TREATMENT  Sometimes BV will clear up without treatment. However, all women with symptoms of BV should be treated to avoid complications, especially if gynecology surgery is planned. Female partners generally do not need to be treated. However,  BV may spread between female sex partners so treatment is helpful in preventing a recurrence of BV.   BV may be treated with antibiotics. The antibiotics come in either pill or vaginal cream forms. Either can be used with nonpregnant or pregnant women, but the recommended dosages differ. These antibiotics are not harmful to the baby.   BV can recur after treatment. If this happens, a second round of antibiotics will often be prescribed.   Treatment is important for pregnant women. If not treated, BV can cause a premature delivery, especially for a pregnant woman who had a premature birth in the past. All pregnant women who have symptoms of BV should be checked and treated.   For chronic reoccurrence of BV, treatment with a type of prescribed gel vaginally twice a week is helpful.  HOME CARE INSTRUCTIONS   Finish all medication as directed by your caregiver.   Do not have sex until treatment is completed.   Tell your sexual partner that you have a vaginal infection. They should see their caregiver and be treated if they have problems, such as a mild rash or itching.   Practice safe sex. Use condoms. Only have 1 sex partner.  PREVENTION  Basic prevention steps can help reduce the risk of upsetting the natural balance of bacteria in the vagina and developing BV:  Do not have sexual intercourse (be abstinent).   Do not douche.   Use all of the medicine prescribed for treatment of BV, even if the signs and symptoms go away.     Tell your sex partner if you have BV. That way, they can be treated, if needed, to prevent reoccurrence.  SEEK MEDICAL CARE IF:   Your symptoms are not improving after 3 days of treatment.   You have increased discharge, pain, or fever.  MAKE SURE YOU:   Understand these instructions.   Will watch your condition.   Will get help right away if you are not doing well or get worse.  FOR MORE INFORMATION  Division of STD Prevention (DSTDP), Centers for Disease  Control and Prevention: www.cdc.gov/std American Social Health Association (ASHA): www.ashastd.org  Document Released: 07/13/2005 Document Revised: 07/02/2011 Document Reviewed: 01/03/2009 ExitCare Patient Information 2012 [employer].Cervicitis Cervicitis is a soreness and swelling (inflammation) of the cervix. Your cervix is located at the bottom of your uterus which opens up to the vagina.  CAUSES   Sexually transmitted infections (STIs).   Allergic reaction.   Medicines or birth control devices that are put in the vagina.   Injury to the cervix.   Bacterial infections.  SYMPTOMS  There may be no symptoms. If symptoms occur, they may include:  Grey, white, yellow, or bad smelling vaginal discharge.   Pain or itching of the area outside the vagina.   Painful sexual intercourse.   Lower abdominal or lower back pain, especially during intercourse.   Frequent urination.   Abnormal vaginal bleeding between periods, after sexual intercourse, or after menopause.   Pressure or a heavy feeling in the pelvis.  DIAGNOSIS  Diagnosis is made after a pelvic exam. Other tests may include:  Examination of any discharge under a microscope (wet prep).   A Pap test.  TREATMENT  Treatment will depend on the cause of cervicitis. If it is caused by an STI, both you and your partner will need to be treated. Antibiotic medicines will be given. HOME CARE INSTRUCTIONS   Do not have sexual intercourse until your caregiver says it is okay.   Do not have sexual intercourse until your partner has been treated if your cervicitis is caused by an STI.   Take your antibiotics as directed. Finish them even if you start to feel better.  SEEK IMMEDIATE MEDICAL CARE IF:   Your symptoms come back.   You have a fever.   You experience any problems that may be related to the medicine you are taking.  MAKE SURE YOU:   Understand these instructions.   Will watch your condition.   Will get  help right away if you are not doing well or get worse.  Document Released: 07/13/2005 Document Revised: 07/02/2011 Document Reviewed: 02/09/2011 ExitCare Patient Information 2012 [employer]. 

## 2011-11-14 NOTE — ED Notes (Signed)
Patient states she has had a thick, white discharge since Thursday. States discharge has a bad odor. C/o itching as well.

## 2011-11-16 LAB — GC/CHLAMYDIA PROBE AMP, GENITAL
Chlamydia, DNA Probe: NEGATIVE
GC Probe Amp, Genital: NEGATIVE

## 2011-11-17 ENCOUNTER — Ambulatory Visit (INDEPENDENT_AMBULATORY_CARE_PROVIDER_SITE_OTHER): Admitting: Obstetrics & Gynecology

## 2011-11-17 ENCOUNTER — Encounter: Payer: Self-pay | Admitting: Obstetrics & Gynecology

## 2011-11-17 VITALS — BP 94/60 | HR 68 | Ht 59.0 in | Wt 105.0 lb

## 2011-11-17 DIAGNOSIS — IMO0001 Reserved for inherently not codable concepts without codable children: Secondary | ICD-10-CM

## 2011-11-17 DIAGNOSIS — Z01419 Encounter for gynecological examination (general) (routine) without abnormal findings: Secondary | ICD-10-CM

## 2011-11-17 DIAGNOSIS — Z309 Encounter for contraceptive management, unspecified: Secondary | ICD-10-CM

## 2011-11-17 DIAGNOSIS — Z0000 Encounter for general adult medical examination without abnormal findings: Secondary | ICD-10-CM

## 2011-11-17 MED ORDER — NORELGESTROMIN-ETH ESTRADIOL 150-35 MCG/24HR TD PTWK: 1.0000 | MEDICATED_PATCH | TRANSDERMAL | Status: DC

## 2011-11-17 MED ORDER — MEDROXYPROGESTERONE ACETATE 150 MG/ML IM SUSP: 150.0000 mg | Freq: Once | INTRAMUSCULAR | Status: DC

## 2011-11-17 NOTE — Progress Notes (Signed)
  Subjective:    [name], female    DOB: 10/28/1990, 21 y.o.   MRN: [id number]  HPI  [name] is a 21 yo G2P2 with a 1 yo and 66 yo. She comes in today for birth control (she currently uses condoms). She has tried OCPs in the past but "throws up." She had the Implanon but didn't like it or the Mirena in the past.  Review of Systems Objective:   Physical Exam        Assessment & Plan:  She desires birth control and she requests depo provera HIV  per her request.

## 2011-11-18 LAB — HIV ANTIBODY (ROUTINE TESTING W REFLEX): HIV: NONREACTIVE

## 2011-12-17 ENCOUNTER — Other Ambulatory Visit (INDEPENDENT_AMBULATORY_CARE_PROVIDER_SITE_OTHER): Admitting: *Deleted

## 2011-12-17 DIAGNOSIS — L659 Nonscarring hair loss, unspecified: Secondary | ICD-10-CM

## 2011-12-17 DIAGNOSIS — N390 Urinary tract infection, site not specified: Secondary | ICD-10-CM

## 2011-12-17 LAB — TSH: TSH: 4.66 u[IU]/mL — ABNORMAL HIGH (ref 0.350–4.500)

## 2011-12-17 MED ORDER — NITROFURANTOIN MACROCRYSTAL 50 MG PO CAPS: 50.0000 mg | ORAL_CAPSULE | Freq: Four times a day (QID) | ORAL | Status: AC

## 2012-03-23 ENCOUNTER — Ambulatory Visit: Admitting: Obstetrics & Gynecology

## 2012-04-20 ENCOUNTER — Encounter

## 2012-04-20 ENCOUNTER — Ambulatory Visit (INDEPENDENT_AMBULATORY_CARE_PROVIDER_SITE_OTHER): Admitting: Gynecology

## 2012-04-20 ENCOUNTER — Encounter: Payer: Self-pay | Admitting: Gynecology

## 2012-04-20 DIAGNOSIS — N920 Excessive and frequent menstruation with regular cycle: Secondary | ICD-10-CM

## 2012-04-20 DIAGNOSIS — Z3480 Encounter for supervision of other normal pregnancy, unspecified trimester: Secondary | ICD-10-CM

## 2012-04-20 LAB — POCT URINE PREGNANCY: Preg Test, Ur: POSITIVE

## 2012-04-20 MED ORDER — PROVIDA OB 20-20-1.25 MG PO CAPS: 20.0000 mg | ORAL_CAPSULE | Freq: Every day | ORAL | Status: DC

## 2012-04-21 LAB — OBSTETRIC PANEL
Antibody Screen: NEGATIVE
Basophils Absolute: 0 10*3/uL (ref 0.0–0.1)
Basophils Relative: 0 % (ref 0–1)
HCT: 37.3 % (ref 36.0–46.0)
Hepatitis B Surface Ag: NEGATIVE
Lymphocytes Relative: 27 % (ref 12–46)
MCHC: 34 g/dL (ref 30.0–36.0)
Neutro Abs: 4.4 10*3/uL (ref 1.7–7.7)
Neutrophils Relative %: 61 % (ref 43–77)
Platelets: 149 10*3/uL — ABNORMAL LOW (ref 150–400)
RDW: 15.8 % — ABNORMAL HIGH (ref 11.5–15.5)
Rubella: 232.1 IU/mL — ABNORMAL HIGH
WBC: 7.2 10*3/uL (ref 4.0–10.5)

## 2012-04-23 LAB — CULTURE, URINE COMPREHENSIVE

## 2012-04-25 LAB — CYSTIC FIBROSIS DIAGNOSTIC STUDY

## 2012-05-02 ENCOUNTER — Telehealth: Payer: Self-pay | Admitting: Gynecology

## 2012-05-02 DIAGNOSIS — O219 Vomiting of pregnancy, unspecified: Secondary | ICD-10-CM

## 2012-05-02 MED ORDER — PROMETHAZINE HCL 25 MG PO TABS: 25.0000 mg | ORAL_TABLET | Freq: Four times a day (QID) | ORAL | Status: DC | PRN

## 2012-05-02 NOTE — Telephone Encounter (Signed)
Patient call regarding being nausea. Asking to send medication to her pharmacy to help her with her symptoms. Phenergan was send to patient pharmacy.

## 2012-05-08 ENCOUNTER — Inpatient Hospital Stay (HOSPITAL_COMMUNITY)
Admission: AD | Admit: 2012-05-08 | Discharge: 2012-05-08 | Disposition: A | Discharge status: Home / Self Care | Source: Ambulatory Visit | Attending: Obstetrics and Gynecology | Admitting: Obstetrics and Gynecology

## 2012-05-08 ENCOUNTER — Encounter (HOSPITAL_COMMUNITY): Payer: Self-pay | Admitting: *Deleted

## 2012-05-08 ENCOUNTER — Telehealth: Payer: Self-pay | Admitting: Obstetrics and Gynecology

## 2012-05-08 DIAGNOSIS — O210 Mild hyperemesis gravidarum: Secondary | ICD-10-CM | POA: Insufficient documentation

## 2012-05-08 DIAGNOSIS — O2390 Unspecified genitourinary tract infection in pregnancy, unspecified trimester: Secondary | ICD-10-CM | POA: Insufficient documentation

## 2012-05-08 DIAGNOSIS — O2340 Unspecified infection of urinary tract in pregnancy, unspecified trimester: Secondary | ICD-10-CM

## 2012-05-08 DIAGNOSIS — O219 Vomiting of pregnancy, unspecified: Secondary | ICD-10-CM

## 2012-05-08 DIAGNOSIS — N390 Urinary tract infection, site not specified: Secondary | ICD-10-CM | POA: Insufficient documentation

## 2012-05-08 LAB — URINALYSIS, ROUTINE W REFLEX MICROSCOPIC
Bilirubin Urine: NEGATIVE
Glucose, UA: NEGATIVE mg/dL
Ketones, ur: 15 mg/dL — AB
Nitrite: POSITIVE — AB
Protein, ur: NEGATIVE mg/dL
pH: 6.5 (ref 5.0–8.0)

## 2012-05-08 LAB — URINE MICROSCOPIC-ADD ON

## 2012-05-08 MED ORDER — PROMETHAZINE HCL 25 MG/ML IJ SOLN
12.5000 mg | Freq: Four times a day (QID) | INTRAMUSCULAR | Status: DC | PRN
  Administered 2012-05-08: 13:00:00 via INTRAVENOUS
  Filled 2012-05-08: qty 1

## 2012-05-08 MED ORDER — ONDANSETRON 8 MG PO TBDP: 8.0000 mg | ORAL_TABLET | Freq: Three times a day (TID) | ORAL | Status: DC | PRN

## 2012-05-08 MED ORDER — LACTATED RINGERS IV BOLUS (SEPSIS)
1000.0000 mL | Freq: Once | INTRAVENOUS | Status: AC
Start: 2012-05-08 — End: 2012-05-08
  Administered 2012-05-08: 1000 mL via INTRAVENOUS

## 2012-05-08 MED ORDER — CEPHALEXIN 500 MG PO CAPS: 500.0000 mg | ORAL_CAPSULE | Freq: Three times a day (TID) | ORAL | Status: DC

## 2012-05-08 MED ORDER — DEXTROSE 5 % IV SOLN
1.0000 g | Freq: Once | INTRAVENOUS | Status: AC
  Administered 2012-05-08: 1 g via INTRAVENOUS
  Filled 2012-05-08: qty 10

## 2012-05-08 MED ORDER — ONDANSETRON HCL 4 MG/2ML IJ SOLN
4.0000 mg | Freq: Once | INTRAMUSCULAR | Status: AC
  Administered 2012-05-08: 4 mg via INTRAVENOUS
  Filled 2012-05-08: qty 2

## 2012-05-08 NOTE — MAU Provider Note (Signed)
History     CSN: [id number]  Arrival date and time: 05/08/12 1048   None     Chief Complaint  Patient presents with  . Emesis During Pregnancy  . Back Pain  . Dizziness   HPI 21 y.o. [id number] at [redacted]w[redacted]d with n/v x 2 weeks, phenergan not helping. Also c/o right sided back pain, has h/o pyelonephritis, concerned she has UTI. No fever.    Past Medical History  Diagnosis Date  . Dysuria   . Headache   . Allergy   . Anemia   . Anxiety   . Hypothyroid     history of.  . Chronic kidney disease     kidney infection.  . Abortion 08/04/2011    Past Surgical History  Procedure Date  . Dilation and curettage of uterus     abortion    Family History  Problem Relation Age of Onset  . Cancer Mother     SKIN  . Asthma Brother   . Diabetes Maternal Grandmother   . Emphysema Paternal Grandfather     History  Substance Use Topics  . Smoking status: Current Every Day Smoker -- 0.5 packs/day  . Smokeless tobacco: Not on file  . Alcohol Use: Yes     occasional    Allergies:  Allergies  Allergen Reactions  . Other Hives and Nausea And Vomiting    Green beans  . Red Dye Nausea And Vomiting  . Sulfa Antibiotics Hives and Swelling    Childhood reaction    Prescriptions prior to admission  Medication Sig Dispense Refill  . promethazine (PHENERGAN) 25 MG tablet Take 1 tablet (25 mg total) by mouth every 6 (six) hours as needed for nausea.  30 tablet  1    Review of Systems  Constitutional: Negative.   Respiratory: Negative.   Cardiovascular: Negative.   Gastrointestinal: Positive for nausea and vomiting. Negative for abdominal pain, diarrhea and constipation.  Genitourinary: Negative for dysuria, urgency, frequency, hematuria and flank pain.       Negative for vaginal bleeding, vaginal discharge  Musculoskeletal: Positive for back pain.  Neurological: Negative.   Psychiatric/Behavioral: Negative.    Physical Exam   Blood pressure 96/73, pulse 105, temperature 98.2  F (36.8 C), temperature source Oral, resp. rate 16, height 4' 11.5" (1.511 m), weight 105 lb 3.2 oz (47.718 kg), last menstrual period 03/21/2012, SpO2 100.00%, unknown if currently breastfeeding.  Physical Exam  Nursing note and vitals reviewed. Constitutional: She is oriented to person, place, and time. She appears well-developed and well-nourished. No distress.  Cardiovascular: Normal rate.   Respiratory: Effort normal.  GI: Soft. There is no tenderness. There is no CVA tenderness.  Musculoskeletal: Normal range of motion.  Neurological: She is alert and oriented to person, place, and time.  Skin: Skin is warm and dry.  Psychiatric: She has a normal mood and affect.    MAU Course  Procedures  Results for orders placed during the hospital encounter of 05/08/12 (from the past 24 hour(s))  URINALYSIS, ROUTINE W REFLEX MICROSCOPIC     Status: Abnormal   Collection Time   05/08/12 11:15 AM      Component Value Range   Color, Urine YELLOW  YELLOW   APPearance CLOUDY (*) CLEAR   Specific Gravity, Urine 1.025  1.005 - 1.030   pH 6.5  5.0 - 8.0   Glucose, UA NEGATIVE  NEGATIVE mg/dL   Hgb urine dipstick TRACE (*) NEGATIVE   Bilirubin Urine NEGATIVE  NEGATIVE  Ketones, ur 15 (*) NEGATIVE mg/dL   Protein, ur NEGATIVE  NEGATIVE mg/dL   Urobilinogen, UA 1.0  0.0 - 1.0 mg/dL   Nitrite POSITIVE (*) NEGATIVE   Leukocytes, UA TRACE (*) NEGATIVE  URINE MICROSCOPIC-ADD ON     Status: Abnormal   Collection Time   05/08/12 11:15 AM      Component Value Range   Squamous Epithelial / LPF RARE  RARE   WBC, UA 7-10  <3 WBC/hpf   RBC / HPF 3-6  <3 RBC/hpf   Bacteria, UA MANY (*) RARE      . cefTRIAXone (ROCEPHIN)  IV  1 g Intravenous Once  . lactated ringers  1,000 mL Intravenous Once  . ondansetron (ZOFRAN) IV  4 mg Intravenous Once  . DISCONTD: medroxyPROGESTERone  150 mg Intramuscular Once     Assessment and Plan   1. Nausea and vomiting in pregnancy   2. UTI in pregnancy        Medication List     As of 05/08/2012  7:26 PM    START taking these medications         cephALEXin 500 MG capsule   Commonly known as: KEFLEX   Take 1 capsule (500 mg total) by mouth 3 (three) times daily.      ondansetron 8 MG disintegrating tablet   Commonly known as: ZOFRAN-ODT   Take 1 tablet (8 mg total) by mouth every 8 (eight) hours as needed for nausea.      CONTINUE taking these medications         promethazine 25 MG tablet   Commonly known as: PHENERGAN   Take 1 tablet (25 mg total) by mouth every 6 (six) hours as needed for nausea.          Where to get your medications    These are the prescriptions that you need to pick up. We sent them to a specific pharmacy, so you will need to go there to get them.   [id number] [address], [address] - [address]    [address] [place] [address]    Phone: [phone]        cephALEXin 500 MG capsule   ondansetron 8 MG disintegrating tablet            Follow-up Information    Follow up with your provider. (for regular prenatal care)           Isiaah Cuervo 05/08/2012, 11:41 AM

## 2012-05-08 NOTE — MAU Provider Note (Signed)
Attestation of Attending Supervision of Advanced Practitioner: Evaluation and management procedures were performed by the PA/NP/CNM/OB Fellow under my supervision/collaboration. Chart reviewed and agree with management and plan.  Adarrius Graeff V 05/08/2012 8:54 PM    

## 2012-05-08 NOTE — MAU Note (Signed)
Patient states she has had nausea and vomiting for about 2 weeks. Not able to keep hardly anything down. Has been having back pain and has a history of kidney infections and is concerned that may be it. Has had her first visit at [facility]. Denies bleeding or vaginal discharge.

## 2012-05-10 ENCOUNTER — Encounter: Admitting: Obstetrics & Gynecology

## 2012-05-10 LAB — URINE CULTURE: Colony Count: >=100000

## 2012-05-25 ENCOUNTER — Encounter: Payer: Self-pay | Admitting: Obstetrics and Gynecology

## 2012-05-25 ENCOUNTER — Ambulatory Visit (INDEPENDENT_AMBULATORY_CARE_PROVIDER_SITE_OTHER): Admitting: Obstetrics and Gynecology

## 2012-05-25 VITALS — BP 93/64 | HR 96 | Ht 59.0 in | Wt 107.0 lb

## 2012-05-25 DIAGNOSIS — Z309 Encounter for contraceptive management, unspecified: Secondary | ICD-10-CM

## 2012-05-25 DIAGNOSIS — O021 Missed abortion: Secondary | ICD-10-CM

## 2012-05-25 NOTE — Progress Notes (Signed)
[name], female   DOB: September 26, 1990, 21 y.o.   MRN: [id number] 21 yo [id number] s/p elective TOP on 10/17 presenting today requesting birth control. Patient reports some vaginal spotting and persistent nausea. UPT pos- will check quant HCG. Patient advised to abstain or use condoms until HCG levels are down to zero.  Patient desires to try a low dose OCP to avoid the issues associated with nausea. Advised patient to start taking OCP when instructed to do so based on quant HCG results. Patient advised to take them at bedtime to minimize nausea symptoms.  RTC prn

## 2012-05-31 ENCOUNTER — Other Ambulatory Visit (INDEPENDENT_AMBULATORY_CARE_PROVIDER_SITE_OTHER): Admitting: *Deleted

## 2012-05-31 DIAGNOSIS — N939 Abnormal uterine and vaginal bleeding, unspecified: Secondary | ICD-10-CM

## 2012-05-31 LAB — HCG, QUANTITATIVE, PREGNANCY: hCG, Beta Chain, Quant, S: 248.2 m[IU]/mL

## 2012-06-01 NOTE — Progress Notes (Signed)
Results given to patient and she will follow up next Tuesday for another hcg quant to ensure it goes back to zero.

## 2012-06-03 ENCOUNTER — Other Ambulatory Visit

## 2012-06-07 ENCOUNTER — Other Ambulatory Visit

## 2012-07-27 NOTE — L&D Delivery Note (Signed)
Attestation of Attending Supervision of Advanced Practitioner (CNM/NP): Evaluation and management procedures were performed by the Advanced Practitioner under my supervision and collaboration. I have reviewed the Advanced Practitioner's note and chart, and I agree with the management and plan.  Susan Bleich H. 9:11 AM

## 2012-07-27 NOTE — L&D Delivery Note (Signed)
Delivery Note Pt progressed steadily through labor and was noted to be C/C/+2 with BBOW.  AROM  At 0435 with clear fluid.  At 5:01 AM a viable female was delivered via  (PresentationLOA;  ).  APGAR: 8/9 ; weight pending.   Placenta status:intact with 3vc .  Cord:  with the following complications: none  .Anesthesia:  epidural Episiotomy: none Lacerations: none Suture Repair: n/a Est. Blood Loss (mL): 400  Mom to postpartum.  Baby to Couplet care / Skin to Skin  Delivery by Dr. Burnis Medin under my supervision.  CRESENZO-DISHMAN,Brianna Bennett 06/16/2013, 5:09 AM

## 2013-03-08 ENCOUNTER — Encounter: Payer: Self-pay | Admitting: Obstetrics and Gynecology

## 2013-03-08 ENCOUNTER — Ambulatory Visit (INDEPENDENT_AMBULATORY_CARE_PROVIDER_SITE_OTHER): Admitting: Obstetrics and Gynecology

## 2013-03-08 VITALS — BP 97/66 | Wt 116.0 lb

## 2013-03-08 DIAGNOSIS — Z3482 Encounter for supervision of other normal pregnancy, second trimester: Secondary | ICD-10-CM

## 2013-03-08 DIAGNOSIS — Z3480 Encounter for supervision of other normal pregnancy, unspecified trimester: Secondary | ICD-10-CM

## 2013-03-08 DIAGNOSIS — O09899 Supervision of other high risk pregnancies, unspecified trimester: Secondary | ICD-10-CM

## 2013-03-08 DIAGNOSIS — Z3490 Encounter for supervision of normal pregnancy, unspecified, unspecified trimester: Secondary | ICD-10-CM | POA: Insufficient documentation

## 2013-03-08 DIAGNOSIS — O09892 Supervision of other high risk pregnancies, second trimester: Secondary | ICD-10-CM

## 2013-03-08 NOTE — Addendum Note (Signed)
Addended by: Barbara Cower on: 03/08/2013 11:32 AM   Modules accepted: Orders

## 2013-03-08 NOTE — Progress Notes (Signed)
Patient doing well without complaints. Transferring care from [place]- no records available for review at today's visit. 1hr GCT and labs at next visit.

## 2013-03-08 NOTE — Progress Notes (Signed)
P = 91 

## 2013-03-30 ENCOUNTER — Ambulatory Visit (INDEPENDENT_AMBULATORY_CARE_PROVIDER_SITE_OTHER): Admitting: Obstetrics & Gynecology

## 2013-03-30 VITALS — BP 108/73 | Wt 118.0 lb

## 2013-03-30 DIAGNOSIS — E049 Nontoxic goiter, unspecified: Secondary | ICD-10-CM

## 2013-03-30 DIAGNOSIS — E079 Disorder of thyroid, unspecified: Secondary | ICD-10-CM

## 2013-03-30 DIAGNOSIS — O09893 Supervision of other high risk pregnancies, third trimester: Secondary | ICD-10-CM

## 2013-03-30 DIAGNOSIS — O99280 Endocrine, nutritional and metabolic diseases complicating pregnancy, unspecified trimester: Secondary | ICD-10-CM

## 2013-03-30 DIAGNOSIS — O09899 Supervision of other high risk pregnancies, unspecified trimester: Secondary | ICD-10-CM

## 2013-03-30 DIAGNOSIS — Z23 Encounter for immunization: Secondary | ICD-10-CM

## 2013-03-30 LAB — CBC
Hemoglobin: 10.2 g/dL — ABNORMAL LOW (ref 12.0–15.0)
MCHC: 33.8 g/dL (ref 30.0–36.0)
Platelets: 227 10*3/uL (ref 150–400)
RBC: 3.44 MIL/uL — ABNORMAL LOW (ref 3.87–5.11)

## 2013-03-30 MED ORDER — TETANUS-DIPHTH-ACELL PERTUSSIS 5-2.5-18.5 LF-MCG/0.5 IM SUSP: 0.5000 mL | Freq: Once | INTRAMUSCULAR | Status: DC

## 2013-03-30 NOTE — Progress Notes (Signed)
P=106 

## 2013-03-30 NOTE — Progress Notes (Signed)
Records reviewed. Patient has history of thyroid dysfunction, was prescribed Synthroid 100 mcg early in pregnancy but the patient did not take this and maintains that her thyroid hormone levels have been normal.  Will check thyroid labs today, in  Addition to 1 hr GTT and other third trimester labs. Counseled about Tdap vaccine, she will receive today.  No other complaints or concerns.  Fetal movement and labor precautions reviewed.

## 2013-03-30 NOTE — Patient Instructions (Signed)
Tetanus, Diphtheria (Td) or Tetanus, Diphtheria, Pertussis (Tdap) Vaccine What You Need to Know WHY GET VACCINATED? Tetanus, diphtheria and pertussis can be very serious diseases. TETANUS (Lockjaw) causes painful muscle spasms and stiffness, usually all over the body.  Tetanus can lead to tightening of muscles in the head and neck so the victim cannot open his mouth or swallow, or sometimes even breathe. Tetanus kills about 1 out of 5 people who are infected. DIPHTHERIA can cause a thick membrane to cover the back of the throat.  Diphtheria can lead to breathing problems, paralysis, heart failure, and even death. PERTUSSIS (Whooping Cough) causes severe coughing spells which can lead to difficulty breathing, vomiting, and disturbed sleep.  Pertussis can lead to weight loss, incontinence, rib fractures and passing out from violent coughing. Up to 2 in 100 adolescents and 5 in 100 adults with pertussis are hospitalized or have complications, including pneumonia and death. These 3 diseases are all caused by bacteria. Diphtheria and pertussis are spread from person to person. Tetanus enters the body through cuts, scratches, or wounds. The [place] saw as many as 200,000 cases a year of diphtheria and pertussis before vaccines were available, and hundreds of cases of tetanus. Since then, tetanus and diphtheria cases have dropped by about 99% and pertussis cases by about 92%. Children 6 years of age and younger get DTaP vaccine to protect them from these three diseases. But older children, adolescents, and adults need protection too. VACCINES FOR ADOLESCENTS AND ADULTS: TD AND TDAP Two vaccines are available to protect people 7 years of age and older from these diseases:  Td vaccine has been used for many years. It protects against tetanus and diphtheria.  Tdap vaccine was licensed in 2005. It is the first vaccine for adolescents and adults that protects against pertussis as well as tetanus and  diphtheria. A Td booster dose is recommended every 10 years. Tdap is given only once. WHICH VACCINE, AND WHEN? Ages 7 through 18 years  A dose of Tdap is recommended at age 11 or 12. This dose could be given as early as age 7 for children who missed one or more childhood doses of DTaP.  Children and adolescents who did not get a complete series of DTaP shots by age 7 should complete the series using a combination of Td and Tdap. Age 19 years and Older  All adults should get a booster dose of Td every 10 years. Adults under 65 who have never gotten Tdap should get a dose of Tdap as their next booster dose. Adults 65 and older may get one booster dose of Tdap.  Adults (including women who may become pregnant and adults 65 and older) who expect to have close contact with a baby younger than 12 months of age should get a dose of Tdap to help protect the baby from pertussis.  Healthcare professionals who have direct patient contact in hospitals or clinics should get one dose of Tdap. Protection After a Wound  A person who gets a severe cut or burn might need a dose of Td or Tdap to prevent tetanus infection. Tdap should be used for anyone who has never had a dose previously. Td should be used if Tdap is not available, or for:  Anybody who has already had a dose of Tdap.  Children 7 through 9 years of age who completed the childhood DTaP series.  Adults 65 and older. Pregnant Women  Pregnant women who have never had a dose of Tdap   should get one, after the 20th week of gestation and preferably during the 3rd trimester. If they do not get Tdap during their pregnancy they should get a dose as soon as possible after delivery. Pregnant women who have previously received Tdap and need tetanus or diphtheria vaccine while pregnant should get Td. Tdap and Td may be given at the same time as other vaccines. SOME PEOPLE SHOULD NOT BE VACCINATED OR SHOULD WAIT  Anyone who has had a life-threatening  allergic reaction after a dose of any tetanus, diphtheria, or pertussis containing vaccine should not get Td or Tdap.  Anyone who has a severe allergy to any component of a vaccine should not get that vaccine. Tell your doctor if the person getting the vaccine has any severe allergies.  Anyone who had a coma, or long or multiple seizures within 7 days after a dose of DTP or DTaP should not get Tdap, unless a cause other than the vaccine was found. These people may get Td.  Talk to your doctor if the person getting either vaccine:  Has epilepsy or another nervous system problem.  Had severe swelling or severe pain after a previous dose of DTP, DTaP, DT, Td, or Tdap vaccine.  Has had Guillain Barr Syndrome (GBS). Anyone who has a moderate or severe illness on the day the shot is scheduled should usually wait until they recover before getting Tdap or Td vaccine. A person with a mild illness or low fever can usually be vaccinated. WHAT ARE THE RISKS FROM TDAP AND TD VACCINES? With a vaccine, as with any medicine, there is always a small risk of a life-threatening allergic reaction or other serious problem. Brief fainting spells and related symptoms (such as jerking movements) can happen after any medical procedure, including vaccination. Sitting or lying down for about 15 minutes after a vaccination can help prevent fainting and injuries caused by falls. Tell your doctor if the patient feels dizzy or lightheaded, or has vision changes or ringing in the ears. Getting tetanus, diphtheria, or pertussis would be much more likely to lead to severe problems than getting either Td or Tdap vaccine. Problems reported after Td and Tdap vaccines are listed below. Mild Problems (noticeable, but did not interfere with activities) Tdap  Pain (about 3 in 4 adolescents and 2 in 3 adults).  Redness or swelling (about 1 in 5).  Mild fever of at least 100.4 F (38 C) (up to about 1 in 25 adolescents and 1 in  100 adults).  Headache (about 4 in 10 adolescents and 3 in 10 adults).  Tiredness (about 1 in 3 adolescents and 1 in 4 adults).  Nausea, vomiting, diarrhea, or stomach ache (up to 1 in 4 adolescents and 1 in 10 adults).  Chills, body aches, sore joints, rash, or swollen glands (uncommon). Td  Pain (up to about 8 in 10).  Redness or swelling at the injection site (up to about 1 in 3).  Mild fever (up to about 1 in 15).  Headache or tiredness (uncommon). Moderate Problems (interfered with activities, but did not require medical attention) Tdap  Pain at the injection site (about 1 in 20 adolescents and 1 in 100 adults).  Redness or swelling at the injection site (up to about 1 in 16 adolescents and 1 in 25 adults).  Fever over 102 F (38.9 C) (about 1 in 100 adolescents and 1 in 250 adults).  Headache (1 in 300).  Nausea, vomiting, diarrhea, or stomach ache (up to 3   in 100 adolescents and 1 in 100 adults). Td  Fever over 102 F (38.9 C) (rare). Tdap or Td  Extensive swelling of the arm where the shot was given (up to about 3 in 100). Severe Problems (Unable to perform usual activities; required medical attention) Tdap or Td  Swelling, severe pain, bleeding, and redness in the arm where the shot was given (rare). A severe allergic reaction could occur after any vaccine. They are estimated to occur less than once in a million doses. WHAT IF THERE IS A SEVERE REACTION? What should I look for? Any unusual condition, such as a severe allergic reaction or a high fever. If a severe allergic reaction occurred, it would be within a few minutes to an hour after the shot. Signs of a serious allergic reaction can include difficulty breathing, weakness, hoarseness or wheezing, a fast heartbeat, hives, dizziness, paleness, or swelling of the throat. What should I do?  Call a doctor, or get the person to a doctor right away.  Tell your doctor what happened, the date and time it  happened, and when the vaccination was given.  Ask your provider to report the reaction by filing a Vaccine Adverse Event Reporting System (VAERS) form. Or, you can file this report through the VAERS website at www.vaers.hhs.gov or by calling 1-800-822-7967. VAERS does not provide medical advice. THE NATIONAL VACCINE INJURY COMPENSATION PROGRAM The National Vaccine Injury Compensation Program (VICP) was created in 1986. Persons who believe they may have been injured by a vaccine can learn about the program and about filing a claim by calling 1-800-338-2382 or visiting the VICP website at www.hrsa.gov/vaccinecompensation. HOW CAN I LEARN MORE?  Your doctor can give you the vaccine package insert or suggest other sources of information.  Call your local or state health department.  Contact the Centers for Disease Control and Prevention (CDC):  Call 1-800-232-4636 (1-800-CDC-INFO).  Visit the CDC website at www.cdc.gov/vaccines. CDC Td and Tdap Interim VIS (08/19/10) Document Released: 05/10/2006 Document Revised: 10/05/2011 Document Reviewed: 08/19/2010 Exit[employer] Patient Information 2014 [employer].  

## 2013-03-31 ENCOUNTER — Encounter: Payer: Self-pay | Admitting: Obstetrics & Gynecology

## 2013-03-31 LAB — HIV ANTIBODY (ROUTINE TESTING W REFLEX): HIV: NONREACTIVE

## 2013-03-31 LAB — TSH: TSH: 2.506 u[IU]/mL (ref 0.350–4.500)

## 2013-03-31 LAB — RPR

## 2013-03-31 LAB — GLUCOSE TOLERANCE, 1 HOUR (50G) W/O FASTING: Glucose, 1 Hour GTT: 92 mg/dL (ref 70–140)

## 2013-04-03 ENCOUNTER — Telehealth: Payer: Self-pay | Admitting: *Deleted

## 2013-04-03 NOTE — Telephone Encounter (Signed)
Patient given normal test results.  She will follow up at her next scheduled visit.

## 2013-04-05 ENCOUNTER — Inpatient Hospital Stay (HOSPITAL_COMMUNITY)
Admission: AD | Admit: 2013-04-05 | Discharge: 2013-04-05 | Disposition: A | Discharge status: Home / Self Care | Source: Ambulatory Visit | Attending: Obstetrics & Gynecology | Admitting: Obstetrics & Gynecology

## 2013-04-05 ENCOUNTER — Encounter (HOSPITAL_COMMUNITY): Payer: Self-pay | Admitting: *Deleted

## 2013-04-05 DIAGNOSIS — O09899 Supervision of other high risk pregnancies, unspecified trimester: Secondary | ICD-10-CM

## 2013-04-05 DIAGNOSIS — N760 Acute vaginitis: Secondary | ICD-10-CM

## 2013-04-05 DIAGNOSIS — B9689 Other specified bacterial agents as the cause of diseases classified elsewhere: Secondary | ICD-10-CM | POA: Insufficient documentation

## 2013-04-05 DIAGNOSIS — O09892 Supervision of other high risk pregnancies, second trimester: Secondary | ICD-10-CM

## 2013-04-05 DIAGNOSIS — N949 Unspecified condition associated with female genital organs and menstrual cycle: Secondary | ICD-10-CM | POA: Insufficient documentation

## 2013-04-05 DIAGNOSIS — A499 Bacterial infection, unspecified: Secondary | ICD-10-CM | POA: Insufficient documentation

## 2013-04-05 DIAGNOSIS — R109 Unspecified abdominal pain: Secondary | ICD-10-CM | POA: Insufficient documentation

## 2013-04-05 DIAGNOSIS — O2390 Unspecified genitourinary tract infection in pregnancy, unspecified trimester: Secondary | ICD-10-CM | POA: Insufficient documentation

## 2013-04-05 LAB — WET PREP, GENITAL: Yeast Wet Prep HPF POC: NONE SEEN

## 2013-04-05 LAB — URINALYSIS, ROUTINE W REFLEX MICROSCOPIC
Bilirubin Urine: NEGATIVE
Ketones, ur: NEGATIVE mg/dL
Nitrite: NEGATIVE
Specific Gravity, Urine: 1.015 (ref 1.005–1.030)
Urobilinogen, UA: 0.2 mg/dL (ref 0.0–1.0)
pH: 7.5 (ref 5.0–8.0)

## 2013-04-05 LAB — URINE MICROSCOPIC-ADD ON

## 2013-04-05 MED ORDER — METRONIDAZOLE 500 MG PO TABS: 500.0000 mg | ORAL_TABLET | Freq: Two times a day (BID) | ORAL | Status: DC

## 2013-04-05 NOTE — Progress Notes (Signed)
Dr Reola Calkins and Dr Debroah Loop on unit and aware of pt's admission and status.

## 2013-04-05 NOTE — MAU Note (Signed)
Pt states has occ. Episodes of heart racing and feeling dizzy with chest pain. Denies chest pain currently.  Had work up with last term pregnancy and dx with mitral valve prolapse. States was not bad enough to be on medications.

## 2013-04-05 NOTE — MAU Provider Note (Signed)
History    22yo [id number] @ 28w who presents with 5 days of vaginal pain that is episodic in nature. Reports it is lower abdominal cramping and sharp vaginal pain. Sexual activity was 5 days ago. Reports she has been under a lot of stress. Last 2 days this pain has been worse. Has been lasting 3-4 sec every 6 minutes for about at a time. No VG/VD. + FM. H/O high and low thyroid but is currently normal on her thyroid hormone as of 9/4 and not on levothyroxine. H/O mitral valve prolapse that is being watched. H/O PID but doesn't think she has had gonorrhea or chlamydia.   CSN: [id number]  Arrival date and time: 04/05/13 1402   First Provider Initiated Contact with Patient 04/05/13 1508      Chief Complaint  Patient presents with  . Vaginal Pain  . Abdominal Pain  . Back Pain   HPI  Pertinent Gynecological History: Sexually transmitted diseases: past history: PID and currently at risk Previous GYN Procedures: none  Last mammogram: N/A Date: N/A     Past Medical History  Diagnosis Date  . Dysuria   . Headache(784.0)   . Allergy   . Anemia   . Anxiety   . Hypothyroid     history of.  . Chronic kidney disease     kidney infection.  . Abortion 08/04/2011  . Mitral valve prolapse     Past Surgical History  Procedure Laterality Date  . Dilation and curettage of uterus      abortion    Family History  Problem Relation Age of Onset  . Cancer Mother     SKIN  . Asthma Brother   . Diabetes Maternal Grandmother   . Emphysema Paternal Grandfather     History  Substance Use Topics  . Smoking status: Current Every Day Smoker -- 0.50 packs/day  . Smokeless tobacco: Never Used  . Alcohol Use: Yes     Comment: occasional but not while pregnant    Allergies:  Allergies  Allergen Reactions  . Other Hives and Nausea And Vomiting    Green beans  . Red Dye Nausea And Vomiting  . Sulfa Antibiotics Hives and Swelling    Childhood reaction    Prescriptions prior to  admission  Medication Sig Dispense Refill  . acetaminophen (TYLENOL) 500 MG tablet Take 500 mg by mouth every 6 (six) hours as needed for pain.      . multivitamin (VIT W/EXTRA C) CHEW chewable tablet Chew 1 tablet by mouth daily.        Review of Systems  Constitutional: Negative for fever, chills, weight loss, malaise/fatigue and diaphoresis.  Cardiovascular: Negative for chest pain and palpitations.  Gastrointestinal: Negative for constipation.  Genitourinary: Negative for dysuria, urgency, frequency, hematuria and flank pain.  Neurological: Negative for weakness.   Physical Exam   Blood pressure 110/65, pulse 113, temperature 97.9 F (36.6 C), resp. rate 18, height 4\' 11"  (1.499 m), weight 54.795 kg (120 lb 12.8 oz), last menstrual period 09/19/2012, SpO2 99.00%.  Physical Exam  Constitutional: She appears well-developed and well-nourished.  HENT:  Head: Normocephalic.  Neck: Normal range of motion.  Cardiovascular: Normal rate and regular rhythm.   Respiratory: Effort normal and breath sounds normal.  GI: Soft.  Genitourinary: Uterus normal. There is tenderness around the vagina. No bleeding around the vagina. Vaginal discharge found.  Neurological: She is alert.  Psychiatric: She has a normal mood and affect. Her behavior is normal. Judgment and  thought content normal.    MAU Course  Procedures Evaluation, Pelvic Exam, Labs Sent. NST.   Assessment and Plan  22yo [id number] @ 28w with bacterial vaginosis.  1) BV - + CLue Cells on Wet Prep with discharge seen on exam consistent with BV. Started flagyl 500mg  PO BID for 7 days, FU at regular OB appt or sooner if symptoms persist or worsen. 2) IUP - normal reactive NST in MAU, no new concerns, continue routine prenatal care  Marissa Calamity 04/05/2013, 4:29 PM   I have seen and examined this patient and agree with above documentation in the resident's note. Pt presents with sharp abdominal pains. Exam consistent with BV.  Suspect this is the etiology.  Will treat with flagyl and see if improves. Routine precautiosn given. Reassuring fetal monitoring for GA.   Rulon Abide, M.D. [facility] Fellow 04/05/2013 7:41 PM

## 2013-04-05 NOTE — MAU Note (Addendum)
Since yesterday I've been having sharp pains in my vagina. Doesn't matter what I'm doing the pain comes and goes. I've been under a lot of stress last few weeks and when I called the office they told me to come in. I also have some occ abdominal pains and back pains

## 2013-04-06 LAB — GC/CHLAMYDIA PROBE AMP: GC Probe RNA: NEGATIVE

## 2013-04-09 ENCOUNTER — Telehealth (HOSPITAL_COMMUNITY): Payer: Self-pay

## 2013-04-12 ENCOUNTER — Encounter (HOSPITAL_COMMUNITY): Payer: Self-pay | Admitting: *Deleted

## 2013-04-12 ENCOUNTER — Inpatient Hospital Stay (HOSPITAL_COMMUNITY)
Admission: AD | Admit: 2013-04-12 | Discharge: 2013-04-13 | Disposition: A | Discharge status: Home / Self Care | Source: Ambulatory Visit | Attending: Obstetrics and Gynecology | Admitting: Obstetrics and Gynecology

## 2013-04-12 DIAGNOSIS — O99891 Other specified diseases and conditions complicating pregnancy: Secondary | ICD-10-CM | POA: Insufficient documentation

## 2013-04-12 DIAGNOSIS — W010XXA Fall on same level from slipping, tripping and stumbling without subsequent striking against object, initial encounter: Secondary | ICD-10-CM | POA: Insufficient documentation

## 2013-04-12 DIAGNOSIS — Y92009 Unspecified place in unspecified non-institutional (private) residence as the place of occurrence of the external cause: Secondary | ICD-10-CM | POA: Insufficient documentation

## 2013-04-12 DIAGNOSIS — O09892 Supervision of other high risk pregnancies, second trimester: Secondary | ICD-10-CM

## 2013-04-12 NOTE — MAU Note (Signed)
Pt fell on her knees at home and is feeling cramps in her lower abdomen.

## 2013-04-12 NOTE — MAU Note (Signed)
Pt reports that she fell tonight at 2100, tripped over something and fell to her knees. Did not hit her abd. Is not bleedind

## 2013-04-13 LAB — URINALYSIS, ROUTINE W REFLEX MICROSCOPIC
Bilirubin Urine: NEGATIVE
Glucose, UA: NEGATIVE mg/dL
Ketones, ur: NEGATIVE mg/dL
Leukocytes, UA: NEGATIVE
Nitrite: NEGATIVE
Specific Gravity, Urine: 1.01 (ref 1.005–1.030)
pH: 6 (ref 5.0–8.0)

## 2013-04-13 LAB — URINE MICROSCOPIC-ADD ON

## 2013-04-13 NOTE — MAU Provider Note (Signed)
  History     CSN: [id number]  Arrival date and time: 04/12/13 2319   First Provider Initiated Contact with Patient 04/13/13 0000      Chief Complaint  Patient presents with  . Fall   HPI [name] is a 22yo [id number] at 29.3wks who presents s/p fall this evening and landing on knees. Denies abd contact/trauma, no leak or bldg. Has felt mild cramps since that time, but does not describe them as painful. She receives [facility] at [facility].  OB History   Grav Para Term Preterm Abortions TAB SAB Ect Mult Living   5 2 2  0 2 2  0 0 2      Past Medical History  Diagnosis Date  . Dysuria   . Headache(784.0)   . Allergy   . Anemia   . Anxiety   . Hypothyroid     history of.  . Chronic kidney disease     kidney infection.  . Abortion 08/04/2011  . Mitral valve prolapse     Past Surgical History  Procedure Laterality Date  . Dilation and curettage of uterus      abortion    Family History  Problem Relation Age of Onset  . Cancer Mother     SKIN  . Asthma Brother   . Diabetes Maternal Grandmother   . Emphysema Paternal Grandfather     History  Substance Use Topics  . Smoking status: Current Every Day Smoker -- 0.50 packs/day  . Smokeless tobacco: Never Used  . Alcohol Use: Yes     Comment: occasional but not while pregnant    Allergies:  Allergies  Allergen Reactions  . Other Hives and Nausea And Vomiting    Green beans  . Red Dye Nausea And Vomiting  . Sulfa Antibiotics Hives and Swelling    Childhood reaction    Prescriptions prior to admission  Medication Sig Dispense Refill  . multivitamin (VIT W/EXTRA C) CHEW chewable tablet Chew 1 tablet by mouth daily.      Marland Kitchen acetaminophen (TYLENOL) 500 MG tablet Take 500 mg by mouth every 6 (six) hours as needed for pain.      . metroNIDAZOLE (FLAGYL) 500 MG tablet Take 1 tablet (500 mg total) by mouth 2 (two) times daily.  14 tablet  0    ROS Physical Exam   Blood pressure 124/69, pulse 112, temperature 98 F  (36.7 C), temperature source Oral, resp. rate 18, height 4\' 11"  (1.499 m), weight 54.885 kg (121 lb), last menstrual period 09/19/2012, SpO2 100.00%.  Physical Exam  Constitutional: She is oriented to person, place, and time. She appears well-developed.  HENT:  Head: Normocephalic.  Cardiovascular: Normal rate.   Respiratory: Effort normal.  GI:  EFM 130-140 +accels, no decels No ctx per toco  Genitourinary:  Cx C/L  Musculoskeletal: Normal range of motion.  Neurological: She is alert and oriented to person, place, and time.  Skin: Skin is warm and dry.  Psychiatric: She has a normal mood and affect. Her behavior is normal. Thought content normal.    MAU Course  Procedures    Assessment and Plan  IUP at 29.3wks S/p fall with landing on knees  D/C home with comfort tips Keep next scheduled visit  Eileen [name] 04/13/2013, 12:50 AM

## 2013-04-13 NOTE — MAU Provider Note (Signed)
Attestation of Attending Supervision of Advanced Practitioner: Evaluation and management procedures were performed by the PA/NP/CNM/OB Fellow under my supervision/collaboration. Chart reviewed and agree with management and plan.  Eilah Common V 04/13/2013 11:20 PM

## 2013-04-14 ENCOUNTER — Encounter: Payer: Self-pay | Admitting: Obstetrics & Gynecology

## 2013-04-14 ENCOUNTER — Ambulatory Visit (INDEPENDENT_AMBULATORY_CARE_PROVIDER_SITE_OTHER): Admitting: Obstetrics & Gynecology

## 2013-04-14 VITALS — BP 110/75 | Wt 120.0 lb

## 2013-04-14 DIAGNOSIS — J069 Acute upper respiratory infection, unspecified: Secondary | ICD-10-CM

## 2013-04-14 DIAGNOSIS — Z3480 Encounter for supervision of other normal pregnancy, unspecified trimester: Secondary | ICD-10-CM

## 2013-04-14 DIAGNOSIS — Z3493 Encounter for supervision of normal pregnancy, unspecified, third trimester: Secondary | ICD-10-CM

## 2013-04-14 DIAGNOSIS — E079 Disorder of thyroid, unspecified: Secondary | ICD-10-CM

## 2013-04-14 MED ORDER — AZITHROMYCIN 250 MG PO TABS: ORAL_TABLET | ORAL | Status: DC

## 2013-04-14 MED ORDER — BENZONATATE 200 MG PO CAPS: 200.0000 mg | ORAL_CAPSULE | Freq: Three times a day (TID) | ORAL | Status: DC | PRN

## 2013-04-14 NOTE — Progress Notes (Signed)
[phone]

## 2013-04-14 NOTE — Progress Notes (Signed)
URI symptoms for 2 weeks, no fevers.  Lungs clear on exam.  Prescribed Tessalon for cough, Z pack. Reviewed previous labs. Patient reports that her due date at her previous practice was 06/22/13 based on a 6 week scan.  She does report having a definite LMP, this was also documented on her prenatal records from [place].  However, her due date was changed to U/S date even though it as consistent with LMP. She was told that we will retain her LMP dating; we are not changing in to her U/S dating as the certain LMP dating is the more accurate modality for dating.    No other complaints or concerns.  Fetal movement and labor precautions reviewed.

## 2013-04-14 NOTE — Patient Instructions (Addendum)

## 2013-04-17 ENCOUNTER — Encounter: Admitting: Obstetrics & Gynecology

## 2013-04-18 ENCOUNTER — Ambulatory Visit (INDEPENDENT_AMBULATORY_CARE_PROVIDER_SITE_OTHER): Admitting: Family Medicine

## 2013-04-18 ENCOUNTER — Encounter: Payer: Self-pay | Admitting: Family Medicine

## 2013-04-18 ENCOUNTER — Telehealth: Payer: Self-pay | Admitting: *Deleted

## 2013-04-18 ENCOUNTER — Encounter: Admitting: Family Medicine

## 2013-04-18 VITALS — BP 115/74 | Wt 122.0 lb

## 2013-04-18 DIAGNOSIS — J40 Bronchitis, not specified as acute or chronic: Secondary | ICD-10-CM

## 2013-04-18 DIAGNOSIS — O99330 Smoking (tobacco) complicating pregnancy, unspecified trimester: Secondary | ICD-10-CM | POA: Insufficient documentation

## 2013-04-18 DIAGNOSIS — Z3480 Encounter for supervision of other normal pregnancy, unspecified trimester: Secondary | ICD-10-CM

## 2013-04-18 MED ORDER — FLUTICASONE PROPIONATE 50 MCG/ACT NA SUSP: 2.0000 | Freq: Every day | NASAL | Status: DC

## 2013-04-18 MED ORDER — AMOXICILLIN 500 MG PO CAPS: 500.0000 mg | ORAL_CAPSULE | Freq: Three times a day (TID) | ORAL | Status: DC

## 2013-04-18 MED ORDER — ALBUTEROL SULFATE HFA 108 (90 BASE) MCG/ACT IN AERS: 2.0000 | INHALATION_SPRAY | Freq: Four times a day (QID) | RESPIRATORY_TRACT | Status: DC | PRN

## 2013-04-18 NOTE — Progress Notes (Signed)
P=[id number] 

## 2013-04-18 NOTE — Telephone Encounter (Signed)
Patient called to ask that we call her meds into [facility] on [address] instead of [facility].  I notified [facility] and meds were sent to new pharmacy.

## 2013-04-18 NOTE — Patient Instructions (Addendum)
Smoking Cessation Quitting smoking is important to your health and has many advantages. However, it is not always easy to quit since nicotine is a very addictive drug. Often times, people try 3 times or more before being able to quit. This document explains the best ways for you to prepare to quit smoking. Quitting takes hard work and a lot of effort, but you can do it. ADVANTAGES OF QUITTING SMOKING  You will live longer, feel better, and live better.  Your body will feel the impact of quitting smoking almost immediately.  Within 20 minutes, blood pressure decreases. Your pulse returns to its normal level.  After 8 hours, carbon monoxide levels in the blood return to normal. Your oxygen level increases.  After 24 hours, the chance of having a heart attack starts to decrease. Your breath, hair, and body stop smelling like smoke.  After 48 hours, damaged nerve endings begin to recover. Your sense of taste and smell improve.  After 72 hours, the body is virtually free of nicotine. Your bronchial tubes relax and breathing becomes easier.  After 2 to 12 weeks, lungs can hold more air. Exercise becomes easier and circulation improves.  The risk of having a heart attack, stroke, cancer, or lung disease is greatly reduced.  After 1 year, the risk of coronary heart disease is cut in half.  After 5 years, the risk of stroke falls to the same as a nonsmoker.  After 10 years, the risk of lung cancer is cut in half and the risk of other cancers decreases significantly.  After 15 years, the risk of coronary heart disease drops, usually to the level of a nonsmoker.  If you are pregnant, quitting smoking will improve your chances of having a healthy baby.  The people you live with, especially any children, will be healthier.  You will have extra money to spend on things other than cigarettes. QUESTIONS TO THINK ABOUT BEFORE ATTEMPTING TO QUIT You may want to talk about your answers with your  caregiver.  Why do you want to quit?  If you tried to quit in the past, what helped and what did not?  What will be the most difficult situations for you after you quit? How will you plan to handle them?  Who can help you through the tough times? Your family? Friends? A caregiver?  What pleasures do you get from smoking? What ways can you still get pleasure if you quit? Here are some questions to ask your caregiver:  How can you help me to be successful at quitting?  What medicine do you think would be best for me and how should I take it?  What should I do if I need more help?  What is smoking withdrawal like? How can I get information on withdrawal? GET READY  Set a quit date.  Change your environment by getting rid of all cigarettes, ashtrays, matches, and lighters in your home, car, or work. Do not let people smoke in your home.  Review your past attempts to quit. Think about what worked and what did not. GET SUPPORT AND ENCOURAGEMENT You have a better chance of being successful if you have help. You can get support in many ways.  Tell your family, friends, and co-workers that you are going to quit and need their support. Ask them not to smoke around you.  Get individual, group, or telephone counseling and support. Programs are available at local hospitals and health centers. Call your local health department for   information about programs in your area.  Spiritual beliefs and practices may help some smokers quit.  Download a "quit meter" on your computer to keep track of quit statistics, such as how long you have gone without smoking, cigarettes not smoked, and money saved.  Get a self-help book about quitting smoking and staying off of tobacco. LEARN NEW SKILLS AND BEHAVIORS  Distract yourself from urges to smoke. Talk to someone, go for a walk, or occupy your time with a task.  Change your normal routine. Take a different route to work. Drink tea instead of coffee.  Eat breakfast in a different place.  Reduce your stress. Take a hot bath, exercise, or read a book.  Plan something enjoyable to do every day. Reward yourself for not smoking.  Explore interactive web-based programs that specialize in helping you quit. GET MEDICINE AND USE IT CORRECTLY Medicines can help you stop smoking and decrease the urge to smoke. Combining medicine with the above behavioral methods and support can greatly increase your chances of successfully quitting smoking.  Nicotine replacement therapy helps deliver nicotine to your body without the negative effects and risks of smoking. Nicotine replacement therapy includes nicotine gum, lozenges, inhalers, nasal sprays, and skin patches. Some may be available over-the-counter and others require a prescription.  Antidepressant medicine helps people abstain from smoking, but how this works is unknown. This medicine is available by prescription.  Nicotinic receptor partial agonist medicine simulates the effect of nicotine in your brain. This medicine is available by prescription. Ask your caregiver for advice about which medicines to use and how to use them based on your health history. Your caregiver will tell you what side effects to look out for if you choose to be on a medicine or therapy. Carefully read the information on the package. Do not use any other product containing nicotine while using a nicotine replacement product.  RELAPSE OR DIFFICULT SITUATIONS Most relapses occur within the first 3 months after quitting. Do not be discouraged if you start smoking again. Remember, most people try several times before finally quitting. You may have symptoms of withdrawal because your body is used to nicotine. You may crave cigarettes, be irritable, feel very hungry, cough often, get headaches, or have difficulty concentrating. The withdrawal symptoms are only temporary. They are strongest when you first quit, but they will go away within  10 14 days. To reduce the chances of relapse, try to:  Avoid drinking alcohol. Drinking lowers your chances of successfully quitting.  Reduce the amount of caffeine you consume. Once you quit smoking, the amount of caffeine in your body increases and can give you symptoms, such as a rapid heartbeat, sweating, and anxiety.  Avoid smokers because they can make you want to smoke.  Do not let weight gain distract you. Many smokers will gain weight when they quit, usually less than 10 pounds. Eat a healthy diet and stay active. You can always lose the weight gained after you quit.  Find ways to improve your mood other than smoking. FOR MORE INFORMATION  www.smokefree.gov  Document Released: 07/07/2001 Document Revised: 01/12/2012 Document Reviewed: 10/22/2011 [facility] Patient Information 2014 [place], [place]. Upper Respiratory Infection, Adult You may use Mucinex and any anti-histamine and Sudafed while pregnant. An upper respiratory infection (URI) is also known as the common cold. It is often caused by a type of germ (virus). Colds are easily spread (contagious). You can pass it to others by kissing, coughing, sneezing, or drinking out of the same  glass. Usually, you get better in 1 or 2 weeks.  HOME CARE   Only take medicine as told by your doctor.  Use a warm mist humidifier or breathe in steam from a hot shower.  Drink enough water and fluids to keep your pee (urine) clear or pale yellow.  Get plenty of rest.  Return to work when your temperature is back to normal or as told by your doctor. You may use a face mask and wash your hands to stop your cold from spreading. GET HELP RIGHT AWAY IF:   After the first few days, you feel you are getting worse.  You have questions about your medicine.  You have chills, shortness of breath, or brown or red spit (mucus).  You have yellow or brown snot (nasal discharge) or pain in the face, especially when you bend forward.  You have a  fever, puffy (swollen) neck, pain when you swallow, or white spots in the back of your throat.  You have a bad headache, ear pain, sinus pain, or chest pain.  You have a high-pitched whistling sound when you breathe in and out (wheezing).  You have a lasting cough or cough up blood.  You have sore muscles or a stiff neck. MAKE SURE YOU:   Understand these instructions.  Will watch your condition.  Will get help right away if you are not doing well or get worse. Document Released: 12/30/2007 Document Revised: 10/05/2011 Document Reviewed: 11/17/2010 [facility] Patient Information 2014 [place], [place].  Pregnancy and Smoking Smoking during pregnancy is very unhealthy for the mother and the developing fetus. The addictive drug in cigarettes (nicotine), carbon monoxide, and many other poisons are inhaled from a cigarette and are carried through your bloodstream to your fetus. Cigarette smoke contains more than 2,500 chemicals. It is not known which of these chemicals are harmful to the developing fetus. However, both nicotine and carbon monoxide play a role in causing health problems in pregnancy. Effects on the fetus of smoking during pregnancy:  Decrease in blood flow and oxygen to the uterus, placenta, and your fetus.  Increased heart rate of the fetus.  Slowing of your fetus's growth in the uterus (intrauterine growth retardation).  Placental problems. Placenta may partially cover or completely cover the opening to the cervix (placenta previa), or the placenta may partially or completely separate from the uterus (placental abruption).  Increase risk of pregnancy outside of the uterus (tubal pregnancy).  Premature rupture membranes, causing the sac that holds the fetus to break too early, resulting in premature birth and increased health risks to the newborn.  Increased risk of birth defects, including heart defects.  Increased risk of miscarriage. Newborns born to women who smoke  during pregnancy:  Are more likely to be born too early (prematurely).  Are more likely to be at a low birth weight.  Are at risk for serious health problems, chronic or lifelong disabilities (cerebral palsy, mental retardation, learning problems), and possibly even death  Are at risk of Sudden Infant Death Syndrome (SIDS).  Have higher rates of miscarriage and stillbirth.  Have more lung and breathing (respiratory) problems. Long-term effects on a child's behavior: Some of the following trends are seen with children of smoking mothers:  Increased risk for drug abuse, behavior, and conduct disorders.  Increased risk for smoking in adolescent girls.  Increased risk for negative behavior in 2-year-olds.  Increase risk for asthma, colic, and childhood obesity, which can lead to diabetes.  Increased risk for finger  and toe disorders. Resources to stop smoking during pregnancy:  Counseling.  Psychological treatment.  Acupuncture.  Family intervention.  Hypnosis.  Medicines that are safe to take during pregnancy. Nicotine supplements have not been studied enough. They should only be considered when all other methods fail.  Telephone QUIT lines. Smoking and Breastfeeding:  Nicotine gets passed to the infant through a mother's breastmilk. This can cause nausea, colic, cramping, and diarrhea in the infant.  Smoking may reduce milk supply and interfere with the let-down response.  Even formula-fed infants of mothers who smoke have nicotine and cotinine (nicotine by-product) in their urine. Other resources to help stop smoking:  American Cancer Society: www.cancer.org  American Heart Association: www.americanheart.org  National Cancer Institute: www.cancer.gov  Smoke Free Families: www.smokefreefamilies.[address] [place] [address]): [phone] START Document Released: 11/24/2004 Document Revised: 10/05/2011 Document Reviewed: 04/24/2009 [facility] Patient Information  2014 [place], [place].  Contraception Choices Contraception (birth control) is the use of any methods or devices to prevent pregnancy. Below are some methods to help avoid pregnancy. HORMONAL METHODS   Contraceptive implant. This is a thin, plastic tube containing progesterone hormone. It does not contain estrogen hormone. Your caregiver inserts the tube in the inner part of the upper arm. The tube can remain in place for up to 3 years. After 3 years, the implant must be removed. The implant prevents the ovaries from releasing an egg (ovulation), thickens the cervical mucus which prevents sperm from entering the uterus, and thins the lining of the inside of the uterus.  Progesterone-only injections. These injections are given every 3 months by your caregiver to prevent pregnancy. This synthetic progesterone hormone stops the ovaries from releasing eggs. It also thickens cervical mucus and changes the uterine lining. This makes it harder for sperm to survive in the uterus.  Birth control pills. These pills contain estrogen and progesterone hormone. They work by stopping the egg from forming in the ovary (ovulation). Birth control pills are prescribed by a caregiver.Birth control pills can also be used to treat heavy periods.  Minipill. This type of birth control pill contains only the progesterone hormone. They are taken every day of each month and must be prescribed by your caregiver.  Birth control patch. The patch contains hormones similar to those in birth control pills. It must be changed once a week and is prescribed by a caregiver.  Vaginal ring. The ring contains hormones similar to those in birth control pills. It is left in the vagina for 3 weeks, removed for 1 week, and then a new one is put back in place. The patient must be comfortable inserting and removing the ring from the vagina.A caregiver's prescription is necessary.  Emergency contraception. Emergency contraceptives prevent  pregnancy after unprotected sexual intercourse. This pill can be taken right after sex or up to 5 days after unprotected sex. It is most effective the sooner you take the pills after having sexual intercourse. Emergency contraceptive pills are available without a prescription. Check with your pharmacist. Do not use emergency contraception as your only form of birth control. BARRIER METHODS   Female condom. This is a thin sheath (latex or rubber) that is worn over the penis during sexual intercourse. It can be used with spermicide to increase effectiveness.  Female condom. This is a soft, loose-fitting sheath that is put into the vagina before sexual intercourse.  Diaphragm. This is a soft, latex, dome-shaped barrier that must be fitted by a caregiver. It is inserted into the vagina, along  with a spermicidal jelly. It is inserted before intercourse. The diaphragm should be left in the vagina for 6 to 8 hours after intercourse.  Cervical cap. This is a round, soft, latex or plastic cup that fits over the cervix and must be fitted by a caregiver. The cap can be left in place for up to 48 hours after intercourse.  Sponge. This is a soft, circular piece of polyurethane foam. The sponge has spermicide in it. It is inserted into the vagina after wetting it and before sexual intercourse.  Spermicides. These are chemicals that kill or block sperm from entering the cervix and uterus. They come in the form of creams, jellies, suppositories, foam, or tablets. They do not require a prescription. They are inserted into the vagina with an applicator before having sexual intercourse. The process must be repeated every time you have sexual intercourse. INTRAUTERINE CONTRACEPTION  Intrauterine device (IUD). This is a T-shaped device that is put in a woman's uterus during a menstrual period to prevent pregnancy. There are 2 types:  Copper IUD. This type of IUD is wrapped in copper wire and is placed inside the uterus.  Copper makes the uterus and fallopian tubes produce a fluid that kills sperm. It can stay in place for 10 years.  Hormone IUD. This type of IUD contains the hormone progestin (synthetic progesterone). The hormone thickens the cervical mucus and prevents sperm from entering the uterus, and it also thins the uterine lining to prevent implantation of a fertilized egg. The hormone can weaken or kill the sperm that get into the uterus. It can stay in place for 5 years. PERMANENT METHODS OF CONTRACEPTION  Female tubal ligation. This is when the woman's fallopian tubes are surgically sealed, tied, or blocked to prevent the egg from traveling to the uterus.  Female sterilization. This is when the female has the tubes that carry sperm tied off (vasectomy).This blocks sperm from entering the vagina during sexual intercourse. After the procedure, the man can still ejaculate fluid (semen). NATURAL PLANNING METHODS  Natural family planning. This is not having sexual intercourse or using a barrier method (condom, diaphragm, cervical cap) on days the woman could become pregnant.  Calendar method. This is keeping track of the length of each menstrual cycle and identifying when you are fertile.  Ovulation method. This is avoiding sexual intercourse during ovulation.  Symptothermal method. This is avoiding sexual intercourse during ovulation, using a thermometer and ovulation symptoms.  Post-ovulation method. This is timing sexual intercourse after you have ovulated. Regardless of which type or method of contraception you choose, it is important that you use condoms to protect against the transmission of sexually transmitted diseases (STDs). Talk with your caregiver about which form of contraception is most appropriate for you. Document Released: 07/13/2005 Document Revised: 10/05/2011 Document Reviewed: 11/19/2010 [facility] Patient Information 2014 [place], [place].  Breastfeeding A change in hormones during your  pregnancy causes growth of your breast tissue and an increase in number and size of milk ducts. The hormone prolactin allows proteins, sugars, and fats from your blood supply to make breast milk in your milk-producing glands. The hormone progesterone prevents breast milk from being released before the birth of your baby. After the birth of your baby, your progesterone level decreases allowing breast milk to be released. Thoughts of your baby, as well as his or her sucking or crying, can stimulate the release of milk from the milk-producing glands. Deciding to breastfeed (nurse) is one of the best choices you  can make for you and your baby. The information that follows gives a brief review of the benefits, as well as other important skills to know about breastfeeding. BENEFITS OF BREASTFEEDING For your baby  The first milk (colostrum) helps your baby's digestive system function better.   There are antibodies in your milk that help your baby fight off infections.   Your baby has a lower incidence of asthma, allergies, and sudden infant death syndrome (SIDS).   The nutrients in breast milk are better for your baby than infant formulas.  Breast milk improves your baby's brain development.   Your baby will have less gas, colic, and constipation.  Your baby is less likely to develop other conditions, such as childhood obesity, asthma, or diabetes mellitus. For you  Breastfeeding helps develop a very special bond between you and your baby.   Breastfeeding is convenient, always available at the correct temperature, and costs nothing.   Breastfeeding helps to burn calories and helps you lose the weight gained during pregnancy.   Breastfeeding makes your uterus contract back down to normal size faster and slows bleeding following delivery.   Breastfeeding mothers have a lower risk of developing osteoporosis or breast or ovarian cancer later in life.  BREASTFEEDING FREQUENCY  A healthy,  full-term baby may breastfeed as often as every hour or space his or her feedings to every 3 hours. Breastfeeding frequency will vary from baby to baby.   Newborns should be fed no less than every 2 3 hours during the day and every 4 5 hours during the night. You should breastfeed a minimum of 8 feedings in a 24 hour period.  Awaken your baby to breastfeed if it has been 3 4 hours since the last feeding.  Breastfeed when you feel the need to reduce the fullness of your breasts or when your newborn shows signs of hunger. Signs that your baby may be hungry include:  Increased alertness or activity.  Stretching.  Movement of the head from side to side.  Movement of the head and opening of the mouth when the corner of the mouth or cheek is stroked (rooting).  Increased sucking sounds, smacking lips, cooing, sighing, or squeaking.  Hand-to-mouth movements.  Increased sucking of fingers or hands.  Fussing.  Intermittent crying.  Signs of extreme hunger will require calming and consoling before you try to feed your baby. Signs of extreme hunger may include:  Restlessness.  A loud, strong cry.  Screaming.  Frequent feeding will help you make more milk and will help prevent problems, such as sore nipples and engorgement of the breasts.  BREASTFEEDING   Whether lying down or sitting, be sure that the baby's abdomen is facing your abdomen.   Support your breast with 4 fingers under your breast and your thumb above your nipple. Make sure your fingers are well away from your nipple and your baby's mouth.   Stroke your baby's lips gently with your finger or nipple.   When your baby's mouth is open wide enough, place all of your nipple and as much of the colored area around your nipple (areola) as possible into your baby's mouth.  More areola should be visible above his or her upper lip than below his or her lower lip.  Your baby's tongue should be between his or her lower gum  and your breast.  Ensure that your baby's mouth is correctly positioned around the nipple (latched). Your baby's lips should create a seal on your breast.  Signs that your baby has effectively latched onto your nipple include:  Tugging or sucking without pain.  Swallowing heard between sucks.  Absent click or smacking sound.  Muscle movement above and in front of his or her ears with sucking.  Your baby must suck about 2 3 minutes in order to get your milk. Allow your baby to feed on each breast as long as he or she wants. Nurse your baby until he or she unlatches or falls asleep at the first breast, then offer the second breast.  Signs that your baby is full and satisfied include:  A gradual decrease in the number of sucks or complete cessation of sucking.  Falling asleep.  Extension or relaxation of his or her body.  Retention of a small amount of milk in his or her mouth.  Letting go of your breast by himself or herself.  Signs of effective breastfeeding in you include:  Breasts that have increased firmness, weight, and size prior to feeding.  Breasts that are softer after nursing.  Increased milk volume, as well as a change in milk consistency and color by the 5th day of breastfeeding.  Breast fullness relieved by breastfeeding.  Nipples are not sore, cracked, or bleeding.  If needed, break the suction by putting your finger into the corner of your baby's mouth and sliding your finger between his or her gums. Then, remove your breast from his or her mouth.  It is common for babies to spit up a small amount after a feeding.  Babies often swallow air during feeding. This can make babies fussy. Burping your baby between breasts can help with this.  Vitamin D supplements are recommended for babies who get only breast milk.  Avoid using a pacifier during your baby's first 4 6 weeks.  Avoid supplemental feedings of water, formula, or juice in place of breastfeeding.  Breast milk is all the food your baby needs. It is not necessary for your baby to have water or formula. Your breasts will make more milk if supplemental feedings are avoided during the early weeks. HOW TO TELL WHETHER YOUR BABY IS GETTING ENOUGH BREAST MILK Wondering whether or not your baby is getting enough milk is a common concern among mothers. You can be assured that your baby is getting enough milk if:   Your baby is actively sucking and you hear swallowing.   Your baby seems relaxed and satisfied after a feeding.   Your baby nurses at least 8 12 times in a 24 hour time period.  During the first 20 4 days of age:  Your baby is wetting at least 3 5 diapers in a 24 hour period. The urine should be clear and pale yellow.  Your baby is having at least 3 4 stools in a 24 hour period. The stool should be soft and yellow.  At 7 23 days of age, your baby is having at least 3 6 stools in a 24 hour period. The stool should be seedy and yellow by 74 days of age.  Your baby has a weight loss less than 7 10% during the first 61 days of age.  Your baby does not lose weight after 9 7 days of age.  Your baby gains 4 7 ounces each week after he or she is 47 days of age.  Your baby gains weight by 36 days of age and is back to birth weight within 2 weeks. ENGORGEMENT In the first week after your baby is born,  you may experience extremely full breasts (engorgement). When engorged, your breasts may feel heavy, warm, or tender to the touch. Engorgement peaks within 24 48 hours after delivery of your baby.  Engorgement may be reduced by:  Continuing to breastfeed.  Increasing the frequency of breastfeeding.  Taking warm showers or applying warm, moist heat to your breasts just before each feeding. This increases circulation and helps the milk flow.   Gently massaging your breast before and during the feedings. With your fingertips, massage from your chest wall towards your nipple in a circular  motion.   Ensuring that your baby empties at least one breast at every feeding. It also helps to start the next feeding on the opposite breast.   Expressing breast milk by hand or by using a breast pump to empty the breasts if your baby is sleepy, or not nursing well. You may also want to express milk if you are returning to work oryou feel you are getting engorged.  Ensuring your baby is latched on and positioned properly while breastfeeding. If you follow these suggestions, your engorgement should improve in 24 48 hours. If you are still experiencing difficulty, call your lactation consultant or caregiver.  CARING FOR YOURSELF Take care of your breasts.  Bathe or shower daily.   Avoid using soap on your nipples.   Wear a supportive bra. Avoid wearing underwire style bras.  Air dry your nipples for a 3 after each feeding.   Use only cotton bra pads to absorb breast milk leakage. Leaking of breast milk between feedings is normal.   Use only pure lanolin on your nipples after nursing. You do not need to wash it off before feeding your baby again. Another option is to express a few drops of breast milk and gently massage that milk into your nipples.  Continue breast self-awareness checks. Take care of yourself.  Eat healthy foods. Alternate 3 meals with 3 snacks.  Avoid foods that you notice affect your baby in a bad way.  Drink milk, fruit juice, and water to satisfy your thirst (about 8 glasses a day).   Rest often, relax, and take your prenatal vitamins to prevent fatigue, stress, and anemia.  Avoid chewing and smoking tobacco.  Avoid alcohol and drug use.  Take over-the-counter and prescribed medicine only as directed by your caregiver or pharmacist. You should always check with your caregiver or pharmacist before taking any new medicine, vitamin, or herbal supplement.  Know that pregnancy is possible while breastfeeding. If desired, talk to your caregiver  about family planning and safe birth control methods that may be used while breastfeeding. SEEK MEDICAL CARE IF:   You feel like you want to stop breastfeeding or have become frustrated with breastfeeding.  You have painful breasts or nipples.  Your nipples are cracked or bleeding.  Your breasts are red, tender, or warm.  You have a swollen area on either breast.  You have a fever or chills.  You have nausea or vomiting.  You have drainage from your nipples.  Your breasts do not become full before feedings by the 5th day after delivery.  You feel sad and depressed.  Your baby is too sleepy to eat well.  Your baby is having trouble sleeping.   Your baby is wetting less than 3 diapers in a 24 hour period.  Your baby has less than 3 stools in a 24 hour period.  Your baby's skin or the white part of his or her eyes  becomes more yellow.   Your baby is not gaining weight by 25 days of age. MAKE SURE YOU:   Understand these instructions.  Will watch your condition.  Will get help right away if you are not doing well or get worse. Document Released: 07/13/2005 Document Revised: 04/06/2012 Document Reviewed: 02/17/2012 [facility] Patient Information 2014 [place], [place].

## 2013-04-18 NOTE — Progress Notes (Signed)
Still with URI sx's.  She is coughing and complaining of sinus pressure.  No better after Zithromax and Tessalon.  No h/o asthma.  Some wheezing which clears with coughing.--trial of switch to amoxil, albuterol, Flonase, Mucinex and Anti-histamine of choice.  May use Sudafed if needed.

## 2013-04-28 ENCOUNTER — Encounter: Admitting: Family Medicine

## 2013-05-04 ENCOUNTER — Telehealth: Payer: Self-pay | Admitting: *Deleted

## 2013-05-04 NOTE — Telephone Encounter (Signed)
Patient is having increased pain and pelvic pressure and decreased fetal movement.  She is 32 weeks and it has been going on since yesterday. She does have an appointment tomorrow at our office but since she is not able to get the baby to move around and is having significantly increased pressure I have advised her to proceed to MAU since she has multiple symptoms.  She agrees and is going to try lying on her left side and drinking a big glass of water first, if this does not help within the next hour she will head to MAU.

## 2013-05-05 ENCOUNTER — Ambulatory Visit (INDEPENDENT_AMBULATORY_CARE_PROVIDER_SITE_OTHER): Admitting: Obstetrics & Gynecology

## 2013-05-05 ENCOUNTER — Encounter: Payer: Self-pay | Admitting: Obstetrics & Gynecology

## 2013-05-05 VITALS — BP 113/66 | Wt 123.0 lb

## 2013-05-05 DIAGNOSIS — E079 Disorder of thyroid, unspecified: Secondary | ICD-10-CM

## 2013-05-05 DIAGNOSIS — Z3480 Encounter for supervision of other normal pregnancy, unspecified trimester: Secondary | ICD-10-CM

## 2013-05-05 DIAGNOSIS — Z3493 Encounter for supervision of normal pregnancy, unspecified, third trimester: Secondary | ICD-10-CM

## 2013-05-05 NOTE — Progress Notes (Signed)
P-107  Patient is having significantly increased pressure and low pelvic pain for about 3 days now.  She has tried increasing drinking and laying on left side with no relief.  She did throw up last night she feels from hurting all day.

## 2013-05-05 NOTE — Patient Instructions (Signed)
Return to clinic for any obstetric concerns or go to MAU for evaluation  

## 2013-05-05 NOTE — Progress Notes (Signed)
Closed multiparous cervix on exam, patient reassured.  No other complaints or concerns.  Fetal movement and labor precautions reviewed.

## 2013-05-13 ENCOUNTER — Encounter (HOSPITAL_COMMUNITY): Payer: Self-pay | Admitting: *Deleted

## 2013-05-13 ENCOUNTER — Inpatient Hospital Stay (HOSPITAL_COMMUNITY)
Admission: AD | Admit: 2013-05-13 | Discharge: 2013-05-14 | Disposition: A | Discharge status: Home / Self Care | Source: Ambulatory Visit | Attending: Family Medicine | Admitting: Family Medicine

## 2013-05-13 DIAGNOSIS — M549 Dorsalgia, unspecified: Secondary | ICD-10-CM

## 2013-05-13 DIAGNOSIS — R109 Unspecified abdominal pain: Secondary | ICD-10-CM | POA: Insufficient documentation

## 2013-05-13 DIAGNOSIS — M545 Low back pain, unspecified: Secondary | ICD-10-CM | POA: Insufficient documentation

## 2013-05-13 DIAGNOSIS — O4700 False labor before 37 completed weeks of gestation, unspecified trimester: Secondary | ICD-10-CM | POA: Insufficient documentation

## 2013-05-13 LAB — OB RESULTS CONSOLE GC/CHLAMYDIA
Chlamydia: NEGATIVE
Gonorrhea: NEGATIVE

## 2013-05-13 LAB — URINALYSIS, ROUTINE W REFLEX MICROSCOPIC
Bilirubin Urine: NEGATIVE
Hgb urine dipstick: NEGATIVE
Ketones, ur: NEGATIVE mg/dL
Nitrite: NEGATIVE
Protein, ur: NEGATIVE mg/dL
Urobilinogen, UA: 0.2 mg/dL (ref 0.0–1.0)

## 2013-05-13 NOTE — MAU Provider Note (Signed)
History     CSN: [id number]  Arrival date and time: 05/13/13 2221   First Provider Initiated Contact with Patient 05/13/13 2301      Chief Complaint  Patient presents with  . Flank Pain   HPI Eileen [name] is a 22 y.o. [phone] female @ [redacted]w[redacted]d who presents w/ report of occ Rt flank pain x 1 day, states she has had pyelo in the past, and this feels like that pain. Denies uti s/s, fever/chills, n/v, lof, vb, abnormal/malodorous vag d/c, or vulvovaginal itching/irritation. Some occ BH.  Reports not drinking as much water as she should. H/O 2 term births w/o ptl. Next appt @ [facility] 1024. Pt had 1 uc on efm, states Rt flank pain correlated w/ uc  OB History   Grav Para Term Preterm Abortions TAB SAB Ect Mult Living   5 2 2  0 2 2  0 0 2      Past Medical History  Diagnosis Date  . Dysuria   . Headache(784.0)   . Allergy   . Anemia   . Anxiety   . Hypothyroid     history of.  . Chronic kidney disease     kidney infection.  . Abortion 08/04/2011  . Mitral valve prolapse     Past Surgical History  Procedure Laterality Date  . Dilation and curettage of uterus      abortion    Family History  Problem Relation Age of Onset  . Cancer Mother     SKIN  . Asthma Brother   . Diabetes Maternal Grandmother   . Emphysema Paternal Grandfather     History  Substance Use Topics  . Smoking status: Current Every Day Smoker -- 0.50 packs/day  . Smokeless tobacco: Never Used  . Alcohol Use: Yes     Comment: occasional but not while pregnant    Allergies:  Allergies  Allergen Reactions  . Other Hives and Nausea And Vomiting    Green beans  . Red Dye Nausea And Vomiting  . Sulfa Antibiotics Hives and Swelling    Childhood reaction    Prescriptions prior to admission  Medication Sig Dispense Refill  . acetaminophen (TYLENOL) 500 MG tablet Take 500 mg by mouth every 6 (six) hours as needed for pain.      Marland Kitchen albuterol (PROVENTIL HFA;VENTOLIN HFA) 108 (90 BASE) MCG/ACT inhaler  Inhale 2 puffs into the lungs every 6 (six) hours as needed for wheezing.  1 Inhaler  2  . multivitamin (VIT W/EXTRA C) CHEW chewable tablet Chew 1 tablet by mouth daily.        Review of Systems  Constitutional: Negative.  Negative for fever and chills.  HENT: Negative.   Eyes: Negative.   Respiratory: Negative.   Cardiovascular: Negative.   Gastrointestinal: Negative.   Genitourinary: Positive for flank pain (Rt).  Musculoskeletal: Negative.   Skin: Negative.   Neurological: Negative.   Endo/Heme/Allergies: Negative.   Psychiatric/Behavioral: Negative.    Physical Exam   Blood pressure 117/66, pulse 114, temperature 97.9 F (36.6 C), resp. rate 20, height 4\' 11"  (1.499 m), weight 58.06 kg (128 lb), last menstrual period 09/19/2012.  Physical Exam  Constitutional: She is oriented to person, place, and time. She appears well-developed and well-nourished.  HENT:  Head: Normocephalic.  Neck: Normal range of motion.  Cardiovascular: Normal rate.   Respiratory: Effort normal.  GI: Soft. There is no tenderness. There is no CVA tenderness.  gravid  Genitourinary:  Spec exam: cx visually closed, mod amount  creamy white nonodorous d/c. Wet prep, gc/ch obtained SVE: 1/th/high, posterior, vtx  Musculoskeletal: Normal range of motion. She exhibits tenderness (Rt lower back).  Neurological: She is alert and oriented to person, place, and time.  Skin: Skin is warm and dry.  Psychiatric: She has a normal mood and affect. Her behavior is normal. Judgment and thought content normal.   FHR: 135, mod variability, 15x15accels, no decels=Cat I UCs: rare, mild MAU Course  Procedures  NST UA Spec exam w/ wet prep, gc/ch SVE PO hydration  Results for orders placed during the hospital encounter of 05/13/13 (from the past 24 hour(s))  URINALYSIS, ROUTINE W REFLEX MICROSCOPIC     Status: None   Collection Time    05/13/13 10:35 PM      Result Value Range   Color, Urine YELLOW  YELLOW    APPearance CLEAR  CLEAR   Specific Gravity, Urine 1.020  1.005 - 1.030   pH 7.5  5.0 - 8.0   Glucose, UA NEGATIVE  NEGATIVE mg/dL   Hgb urine dipstick NEGATIVE  NEGATIVE   Bilirubin Urine NEGATIVE  NEGATIVE   Ketones, ur NEGATIVE  NEGATIVE mg/dL   Protein, ur NEGATIVE  NEGATIVE mg/dL   Urobilinogen, UA 0.2  0.0 - 1.0 mg/dL   Nitrite NEGATIVE  NEGATIVE   Leukocytes, UA NEGATIVE  NEGATIVE  WET PREP, GENITAL     Status: Abnormal   Collection Time    05/13/13 11:00 PM      Result Value Range   Yeast Wet Prep HPF POC NONE SEEN  NONE SEEN   Trich, Wet Prep NONE SEEN  NONE SEEN   Clue Cells Wet Prep HPF POC NONE SEEN  NONE SEEN   WBC, Wet Prep HPF POC FEW (*) NONE SEEN     Assessment and Plan  A:  [redacted]w[redacted]d SIUP  [id number]   Rare, mild uc's  Rt lower back musculoskeletal pain  Cat I FHR   P:  D/C home  To increase po hydration at home  Discussed relief measures for musculoskeletal back pain  Reviewed ptl s/s, fkc, reasons to return   [name] 05/13/2013, 11:13 PM

## 2013-05-13 NOTE — MAU Note (Signed)
I've been having R flank pain since yesterday and is getting worse. I've had 4-5 kidney infections in my life and it usually feels like this.

## 2013-05-14 DIAGNOSIS — O9989 Other specified diseases and conditions complicating pregnancy, childbirth and the puerperium: Secondary | ICD-10-CM

## 2013-05-14 NOTE — MAU Provider Note (Signed)
Chart reviewed and agree with management and plan.  

## 2013-05-15 LAB — GC/CHLAMYDIA PROBE AMP
CT Probe RNA: NEGATIVE
GC Probe RNA: NEGATIVE

## 2013-05-19 ENCOUNTER — Encounter: Payer: Self-pay | Admitting: Obstetrics & Gynecology

## 2013-05-19 ENCOUNTER — Ambulatory Visit (INDEPENDENT_AMBULATORY_CARE_PROVIDER_SITE_OTHER): Admitting: Obstetrics & Gynecology

## 2013-05-19 VITALS — BP 110/61 | Wt 127.0 lb

## 2013-05-19 DIAGNOSIS — Z3480 Encounter for supervision of other normal pregnancy, unspecified trimester: Secondary | ICD-10-CM

## 2013-05-19 DIAGNOSIS — Z3490 Encounter for supervision of normal pregnancy, unspecified, unspecified trimester: Secondary | ICD-10-CM

## 2013-05-19 NOTE — Progress Notes (Signed)
Routine visit. Good FM. She agains declines flu vaccine, understands that it is recommended. Denies s/sx of PTL. She would like a cervical exam. She was 1 cm at MAU last week.PTL precautions reviewed. Cervical cultures/GBS at next visit.

## 2013-05-31 ENCOUNTER — Inpatient Hospital Stay (HOSPITAL_COMMUNITY)
Admission: AD | Admit: 2013-05-31 | Discharge: 2013-05-31 | Disposition: A | Discharge status: Home / Self Care | Payer: 59 | Source: Ambulatory Visit | Attending: Obstetrics & Gynecology | Admitting: Obstetrics & Gynecology

## 2013-05-31 DIAGNOSIS — O99891 Other specified diseases and conditions complicating pregnancy: Secondary | ICD-10-CM | POA: Insufficient documentation

## 2013-05-31 DIAGNOSIS — Z3490 Encounter for supervision of normal pregnancy, unspecified, unspecified trimester: Secondary | ICD-10-CM

## 2013-05-31 DIAGNOSIS — O4700 False labor before 37 completed weeks of gestation, unspecified trimester: Secondary | ICD-10-CM | POA: Insufficient documentation

## 2013-05-31 DIAGNOSIS — O479 False labor, unspecified: Secondary | ICD-10-CM

## 2013-05-31 DIAGNOSIS — N949 Unspecified condition associated with female genital organs and menstrual cycle: Secondary | ICD-10-CM | POA: Insufficient documentation

## 2013-05-31 DIAGNOSIS — O99333 Smoking (tobacco) complicating pregnancy, third trimester: Secondary | ICD-10-CM

## 2013-05-31 NOTE — OB Triage Note (Signed)
Discharge instructions given and signed. Pt verbalized understanding. Pt ambulated out the family

## 2013-05-31 NOTE — Progress Notes (Signed)
[name], female   DOB: 02/18/1991, 22 y.o.   MRN: [id number]  Ms [name] is a 22yo [id number] at 36.2wks who presents for eval of possible leaking amniotic fluid. Is not wearing a pad; just has felt 'damp' today.  Denies bldg or ctx; reports +FM. Denies N/V/D or H/A. She receives her [facility] at the [facility] office and it has been remarkable for 1) tx of care from [place] at 24wks 2) hx thyroid dysfunction- nl labs in preg without medications.  VSS, afeb PE WNL FHR 125 with +accels, no decels irreg ctx per [facility] SSE: neg pool, neg fern, cx FT ext os, closed int os/thick  IUP at 36.2wks Vag d/c  D/C home with labor/ROM/bldg precautions F/U at [facility] as scheduled  Cam Hai 05/31/2013 10:48 PM

## 2013-05-31 NOTE — OB Triage Note (Signed)
Pt c/o leaking fluid starting yesterday (05/30/13). Some abd and back pain

## 2013-06-01 ENCOUNTER — Ambulatory Visit (INDEPENDENT_AMBULATORY_CARE_PROVIDER_SITE_OTHER): Admitting: Obstetrics & Gynecology

## 2013-06-01 ENCOUNTER — Encounter: Payer: Self-pay | Admitting: Obstetrics & Gynecology

## 2013-06-01 VITALS — BP 104/72 | Wt 127.0 lb

## 2013-06-01 DIAGNOSIS — Z3493 Encounter for supervision of normal pregnancy, unspecified, third trimester: Secondary | ICD-10-CM

## 2013-06-01 DIAGNOSIS — E079 Disorder of thyroid, unspecified: Secondary | ICD-10-CM

## 2013-06-01 DIAGNOSIS — Z3480 Encounter for supervision of other normal pregnancy, unspecified trimester: Secondary | ICD-10-CM

## 2013-06-01 DIAGNOSIS — E049 Nontoxic goiter, unspecified: Secondary | ICD-10-CM

## 2013-06-01 LAB — T4, FREE: Free T4: 1.07 ng/dL (ref 0.80–1.80)

## 2013-06-01 LAB — OB RESULTS CONSOLE GBS: GBS: NEGATIVE

## 2013-06-01 LAB — TSH: TSH: 2.732 u[IU]/mL (ref 0.350–4.500)

## 2013-06-01 NOTE — Progress Notes (Signed)
Pelvic cultures done today. Thyroid labs checked.  No other complaints or concerns.  Fetal movement and labor precautions reviewed.

## 2013-06-01 NOTE — Progress Notes (Signed)
P = 107 

## 2013-06-01 NOTE — Patient Instructions (Signed)
Return to clinic for any obstetric concerns or go to MAU for evaluation  

## 2013-06-02 LAB — GC/CHLAMYDIA PROBE AMP: GC Probe RNA: NEGATIVE

## 2013-06-05 ENCOUNTER — Encounter: Payer: Self-pay | Admitting: Obstetrics & Gynecology

## 2013-06-07 ENCOUNTER — Encounter: Payer: Self-pay | Admitting: Obstetrics and Gynecology

## 2013-06-07 ENCOUNTER — Ambulatory Visit (INDEPENDENT_AMBULATORY_CARE_PROVIDER_SITE_OTHER): Admitting: Obstetrics and Gynecology

## 2013-06-07 VITALS — BP 121/82 | Wt 128.0 lb

## 2013-06-07 DIAGNOSIS — Z3493 Encounter for supervision of normal pregnancy, unspecified, third trimester: Secondary | ICD-10-CM

## 2013-06-07 DIAGNOSIS — Z3480 Encounter for supervision of other normal pregnancy, unspecified trimester: Secondary | ICD-10-CM

## 2013-06-07 DIAGNOSIS — E079 Disorder of thyroid, unspecified: Secondary | ICD-10-CM

## 2013-06-07 NOTE — Progress Notes (Signed)
Patient doing well without complaints. FM/labor precautions reviewed 

## 2013-06-07 NOTE — Progress Notes (Signed)
P-102 

## 2013-06-08 ENCOUNTER — Inpatient Hospital Stay (HOSPITAL_COMMUNITY)
Admission: AD | Admit: 2013-06-08 | Discharge: 2013-06-08 | Disposition: A | Discharge status: Home / Self Care | Source: Ambulatory Visit | Attending: Obstetrics & Gynecology | Admitting: Obstetrics & Gynecology

## 2013-06-08 ENCOUNTER — Encounter (HOSPITAL_COMMUNITY): Payer: Self-pay

## 2013-06-08 ENCOUNTER — Encounter: Payer: Self-pay | Admitting: Obstetrics & Gynecology

## 2013-06-08 DIAGNOSIS — O9989 Other specified diseases and conditions complicating pregnancy, childbirth and the puerperium: Secondary | ICD-10-CM | POA: Insufficient documentation

## 2013-06-08 DIAGNOSIS — O99330 Smoking (tobacco) complicating pregnancy, unspecified trimester: Secondary | ICD-10-CM | POA: Insufficient documentation

## 2013-06-08 DIAGNOSIS — L299 Pruritus, unspecified: Secondary | ICD-10-CM

## 2013-06-08 LAB — COMPREHENSIVE METABOLIC PANEL
ALT: 6 U/L (ref 0–35)
Alkaline Phosphatase: 197 U/L — ABNORMAL HIGH (ref 39–117)
BUN: 6 mg/dL (ref 6–23)
CO2: 22 mEq/L (ref 19–32)
Chloride: 101 mEq/L (ref 96–112)
GFR calc Af Amer: >90 mL/min (ref >90)
Glucose, Bld: 82 mg/dL (ref 70–99)
Potassium: 4 mEq/L (ref 3.5–5.1)
Sodium: 134 mEq/L — ABNORMAL LOW (ref 135–145)
Total Bilirubin: 0.2 mg/dL — ABNORMAL LOW (ref 0.3–1.2)
Total Protein: 7 g/dL (ref 6.0–8.3)

## 2013-06-08 MED ORDER — DIPHENHYDRAMINE HCL 25 MG PO CAPS
25.0000 mg | ORAL_CAPSULE | Freq: Once | ORAL | Status: AC
  Administered 2013-06-08: 25 mg via ORAL
  Filled 2013-06-08: qty 1

## 2013-06-08 MED ORDER — TRIAMCINOLONE ACETONIDE 0.1 % EX OINT: 1.0000 "application " | TOPICAL_OINTMENT | Freq: Two times a day (BID) | CUTANEOUS | Status: DC

## 2013-06-08 MED ORDER — HYDROXYZINE HCL 25 MG PO TABS: 25.0000 mg | ORAL_TABLET | Freq: Three times a day (TID) | ORAL | Status: DC | PRN

## 2013-06-08 NOTE — MAU Note (Signed)
Patient states she has had itching all over her body for the entire pregnancy and has sores from scratching. States she now has itching on her hands and feet and was told to come to MAU for evaluation. Irregular contractions. Denies bleeding or leaking. Reports good fetal movement.

## 2013-06-08 NOTE — MAU Note (Signed)
Patient is in with c/o persistent itching in her hands, legs and feets and rashes. She states that she have  No relief from any topical medications that she have tried including rx. She denies vaginal bleeding or lof. She reports good fetal movement. She states that her provider advised her to come to MAU for lab work.

## 2013-06-08 NOTE — MAU Provider Note (Signed)
Chief Complaint:  Pruritis   Eileen [name] is a 22 y.o.  [phone] with IUP at [redacted]w[redacted]d presenting for Pruritis  Pt reports intermittent itching of her legs for the last 2 months. Today she awoke with new significant itching of her hands and feet. She denies any swelling, rash or drainage. She has not used any new detergent, lotions, perfumes, etc.  She does not have any pets in her home. She lives with her children and they do not have any similar symptoms. She is eating and drinking normally. She denies nausea, vomiting, fever, chills. She has tried topical hydrocortisone, triamcinolone (borrowed from a relative), topical benadryl and a topical athletes foot cream however none of these have helped. She denies contractions, LOF and vaginal bleeding. She reports normal fetal movement.   She [facility].    Menstrual History: OB History   Grav Para Term Preterm Abortions TAB SAB Ect Mult Living   5 2 2  0 2 2  0 0 2      Patient's last menstrual period was 09/19/2012.      Past Medical History  Diagnosis Date  . Dysuria   . Headache(784.0)   . Allergy   . Anemia   . Anxiety   . Hypothyroid     history of.  . Chronic kidney disease     kidney infection.  . Abortion 08/04/2011  . Mitral valve prolapse     Past Surgical History  Procedure Laterality Date  . Dilation and curettage of uterus      abortion    Family History  Problem Relation Age of Onset  . Cancer Mother     SKIN  . Asthma Brother   . Diabetes Maternal Grandmother   . Emphysema Paternal Grandfather     History  Substance Use Topics  . Smoking status: Current Every Day Smoker -- 0.50 packs/day  . Smokeless tobacco: Never Used  . Alcohol Use: Yes     Comment: occasional but not while pregnant      Allergies  Allergen Reactions  . Other Hives and Nausea And Vomiting    Green beans  . Red Dye Nausea And Vomiting  . Sulfa Antibiotics Hives and Swelling    Childhood  reaction    No prescriptions prior to admission    Review of Systems - Negative except for what is mentioned in HPI.  Physical Exam  Blood pressure 112/87, pulse 113, temperature 98.2 F (36.8 C), temperature source Oral, resp. rate 16, height 5' (1.524 m), weight 57.607 kg (127 lb), last menstrual period 09/19/2012, SpO2 100.00%. GENERAL: Well-developed, well-nourished female in no acute distress.  LUNGS: Clear to auscultation bilaterally.  HEART: Regular rate and rhythm. ABDOMEN: Soft, nontender, nondistended, gravid. Normal bowel sounds.  EXTREMITIES: Nontender, no edema, 2+ distal pulses. Skin: Scattered small circular erythematous papules concentrated primarily on bilateral lower legs and bilateral buttocks, some scabbed over. No drainage. Nontender. No burrows. No rash or lesions on palms or soles.  FHT:  Baseline rate 125 bpm   Variability moderate  Accelerations present   Decelerations none Contractions: Every 15 mins   Labs: Results for orders placed during the hospital encounter of 06/08/13 (from the past 24 hour(s))  COMPREHENSIVE METABOLIC PANEL   Collection Time    06/08/13 10:50 AM      Result Value Range   Sodium 134 (*) 135 - 145 mEq/L   Potassium 4.0  3.5 - 5.1 mEq/L   Chloride 101  96 - 112 mEq/L   CO2 22  19 - 32 mEq/L   Glucose, Bld 82  70 - 99 mg/dL   BUN 6  6 - 23 mg/dL   Creatinine, Ser 1.61  0.50 - 1.10 mg/dL   Calcium 9.2  8.4 - 09.6 mg/dL   Total Protein 7.0  6.0 - 8.3 g/dL   Albumin 2.9 (*) 3.5 - 5.2 g/dL   AST 15  0 - 37 U/L   ALT 6  0 - 35 U/L   Alkaline Phosphatase 197 (*) 39 - 117 U/L   Total Bilirubin 0.2 (*) 0.3 - 1.2 mg/dL   GFR calc non Af Amer >90  >90 mL/min   GFR calc Af Amer >90  >90 mL/min    Imaging Studies:  No results found.  Assessment: Eileen [name] is  22 y.o. [phone] at [redacted]w[redacted]d presents with Pruritis   Plan: Pruritis: Differential diagnosis includes insect bites (bed bugs or mosquitoes), xerosis, skin hypersensitivity,  folliculitis and cholestasis of pregnancy.  - CMP with normal AST, ALT and total bilirubin.  - Bile acid level obtained and pending. Will follow up result and contact patient if abnormal.  - Recommend washing all linens in hot water and drying on high heat setting. - Prescriptions provided for Atarax and Triamcinolone.  - Message sent to [facility] requesting appointment on Monday 11/17 for NST and re-evaluation.  - Return precautions given.   Pt seen and discussed with Dr. Baxter Hire, Ryann 11/13/201410:55 AM  I spoke with and examined patient and agree with resident's note and plan of care. I discussed this patient with Dr. Debroah Loop. Agreed with sending to NST on Monday at [facility] for close follow up and check on bile acid status. Agree with atarax and triamcinalone for lesions.  Tawana Scale, MD OB Fellow 06/08/2013 2:52 PM

## 2013-06-08 NOTE — Progress Notes (Signed)
Dr [place] Sink notified of patient and presenting complaints.

## 2013-06-14 ENCOUNTER — Ambulatory Visit (INDEPENDENT_AMBULATORY_CARE_PROVIDER_SITE_OTHER): Payer: 59 | Admitting: Obstetrics & Gynecology

## 2013-06-14 ENCOUNTER — Encounter: Payer: Self-pay | Admitting: Obstetrics & Gynecology

## 2013-06-14 VITALS — BP 112/75 | Wt 128.0 lb

## 2013-06-14 DIAGNOSIS — Z3480 Encounter for supervision of other normal pregnancy, unspecified trimester: Secondary | ICD-10-CM

## 2013-06-14 DIAGNOSIS — Z3493 Encounter for supervision of normal pregnancy, unspecified, third trimester: Secondary | ICD-10-CM

## 2013-06-14 DIAGNOSIS — O9989 Other specified diseases and conditions complicating pregnancy, childbirth and the puerperium: Secondary | ICD-10-CM

## 2013-06-14 DIAGNOSIS — E079 Disorder of thyroid, unspecified: Secondary | ICD-10-CM

## 2013-06-14 DIAGNOSIS — L299 Pruritus, unspecified: Secondary | ICD-10-CM

## 2013-06-14 LAB — COMPREHENSIVE METABOLIC PANEL
ALT: <8 U/L (ref 0–35)
Alkaline Phosphatase: 218 U/L — ABNORMAL HIGH (ref 39–117)
CO2: 23 mEq/L (ref 19–32)
Creat: 0.58 mg/dL (ref 0.50–1.10)
Total Bilirubin: 0.3 mg/dL (ref 0.3–1.2)

## 2013-06-14 NOTE — Progress Notes (Signed)
P-101 

## 2013-06-14 NOTE — Patient Instructions (Signed)
Return to clinic for any obstetric concerns or go to MAU for evaluation  

## 2013-06-14 NOTE — Progress Notes (Signed)
Patient still reports itching, had bile acid level of 9 on 06/08/13.  Bile acids, CMET to be rechecked today; will follow up results and manage accordingly.  Also reports constipation, recommended OTC remedies.  Fetal movement and labor precautions reviewed.

## 2013-06-15 ENCOUNTER — Encounter (HOSPITAL_COMMUNITY): Payer: Self-pay | Admitting: *Deleted

## 2013-06-15 ENCOUNTER — Inpatient Hospital Stay (HOSPITAL_COMMUNITY)
Admission: AD | Admit: 2013-06-15 | Discharge: 2013-06-18 | DRG: 774 | Disposition: A | Discharge status: Home / Self Care | Payer: Medicaid Other | Source: Ambulatory Visit | Attending: Obstetrics & Gynecology | Admitting: Obstetrics & Gynecology

## 2013-06-15 DIAGNOSIS — O99334 Smoking (tobacco) complicating childbirth: Secondary | ICD-10-CM | POA: Diagnosis present

## 2013-06-15 DIAGNOSIS — E079 Disorder of thyroid, unspecified: Secondary | ICD-10-CM

## 2013-06-15 DIAGNOSIS — O99333 Smoking (tobacco) complicating pregnancy, third trimester: Secondary | ICD-10-CM

## 2013-06-15 DIAGNOSIS — I2510 Atherosclerotic heart disease of native coronary artery without angina pectoris: Principal | ICD-10-CM | POA: Diagnosis present

## 2013-06-15 DIAGNOSIS — Z3493 Encounter for supervision of normal pregnancy, unspecified, third trimester: Secondary | ICD-10-CM

## 2013-06-15 DIAGNOSIS — E039 Hypothyroidism, unspecified: Secondary | ICD-10-CM | POA: Diagnosis present

## 2013-06-15 DIAGNOSIS — I059 Rheumatic mitral valve disease, unspecified: Secondary | ICD-10-CM | POA: Diagnosis present

## 2013-06-15 NOTE — MAU Note (Signed)
Contractions, with urge to push

## 2013-06-15 NOTE — MAU Provider Note (Signed)
  History     CSN: [id number]  Arrival date and time: 06/15/13 2246   None     Chief Complaint  Patient presents with  . Labor Eval   HPI  22 yo P2 female at 38+ weeks EGA has c/o contractions and feelings of chest pain and heart palpitations.   Labor--no LOF, VB.  Cervix was 2/70 yesterday at MD office CP--Pt has history of MVP.  She only has symptoms during her pregnancies.  Pain is intermittent and associated with the palpitations.  NO radiation fo pain, no substernal chest pain, no diaphoresis, no syncope.  Past Medical History  Diagnosis Date  . Dysuria   . Headache(784.0)   . Allergy   . Anemia   . Anxiety   . Hypothyroid     history of.  . Chronic kidney disease     kidney infection.  . Abortion 08/04/2011  . Mitral valve prolapse     Past Surgical History  Procedure Laterality Date  . Dilation and curettage of uterus      abortion    Family History  Problem Relation Age of Onset  . Cancer Mother     SKIN  . Asthma Brother   . Diabetes Maternal Grandmother   . Emphysema Paternal Grandfather     History  Substance Use Topics  . Smoking status: Current Every Day Smoker -- 0.50 packs/day  . Smokeless tobacco: Never Used  . Alcohol Use: Yes     Comment: occasional but not while pregnant    Allergies:  Allergies  Allergen Reactions  . Other Hives and Nausea And Vomiting    Green beans  . Red Dye Nausea And Vomiting  . Sulfa Antibiotics Hives and Swelling    Childhood reaction    Prescriptions prior to admission  Medication Sig Dispense Refill  . hydrOXYzine (ATARAX/VISTARIL) 25 MG tablet Take 1 tablet (25 mg total) by mouth every 8 (eight) hours as needed.  30 tablet  1  . triamcinolone ointment (KENALOG) 0.1 % Apply 1 application topically 2 (two) times daily.  30 g  0    ROS Physical Exam   Blood pressure 117/70, pulse 102, resp. rate 18, height 4\' 11"  (1.499 m), weight 130 lb (58.968 kg), last menstrual period 09/19/2012, SpO2  100.00%.  Physical Exam  Vitals reviewed. Constitutional: She is oriented to person, place, and time. She appears well-developed and well-nourished. No distress.  HENT:  Head: Normocephalic and atraumatic.  Eyes: Conjunctivae are normal.  Cardiovascular: Normal rate and regular rhythm.   Respiratory: No respiratory distress. She has no wheezes. She has no rales.  GI: Soft. There is no tenderness.  Genitourinary:  4/70/-3 posterior  Musculoskeletal: She exhibits no edema.  Neurological: She is alert and oriented to person, place, and time.  Skin: Skin is warm and dry.  Psychiatric: She has a normal mood and affect.    MAU Course  Procedures Labor evaluation  Assessment and Plan  22 yo P2 and 38 weeks 3 days with labor evaluation and chest pain / heart palpitations assoicatied with her MVP.  1-Recheck cervix one hour 2-Refer to cardiologist with outpatient referral.  [facility] staff to follow up.  Leverne Amrhein H. 06/15/2013, 11:46 PM

## 2013-06-16 ENCOUNTER — Encounter (HOSPITAL_COMMUNITY): Payer: Medicaid Other | Admitting: Anesthesiology

## 2013-06-16 ENCOUNTER — Inpatient Hospital Stay (HOSPITAL_COMMUNITY): Payer: Medicaid Other | Admitting: Anesthesiology

## 2013-06-16 ENCOUNTER — Encounter (HOSPITAL_COMMUNITY): Payer: Self-pay

## 2013-06-16 DIAGNOSIS — E079 Disorder of thyroid, unspecified: Secondary | ICD-10-CM

## 2013-06-16 DIAGNOSIS — O9942 Diseases of the circulatory system complicating childbirth: Secondary | ICD-10-CM

## 2013-06-16 DIAGNOSIS — E039 Hypothyroidism, unspecified: Secondary | ICD-10-CM

## 2013-06-16 DIAGNOSIS — O99284 Endocrine, nutritional and metabolic diseases complicating childbirth: Secondary | ICD-10-CM

## 2013-06-16 DIAGNOSIS — I2510 Atherosclerotic heart disease of native coronary artery without angina pectoris: Secondary | ICD-10-CM

## 2013-06-16 DIAGNOSIS — I059 Rheumatic mitral valve disease, unspecified: Secondary | ICD-10-CM

## 2013-06-16 LAB — CBC
HCT: 28.5 % — ABNORMAL LOW (ref 36.0–46.0)
MCH: 26.4 pg (ref 26.0–34.0)
MCHC: 33.3 g/dL (ref 30.0–36.0)
MCV: 79.2 fL (ref 78.0–100.0)
RDW: 14.3 % (ref 11.5–15.5)
WBC: 13.1 10*3/uL — ABNORMAL HIGH (ref 4.0–10.5)

## 2013-06-16 LAB — BILE ACIDS, TOTAL: Bile Acids Total: 12 umol/L (ref 0–19)

## 2013-06-16 MED ORDER — PNEUMOCOCCAL VAC POLYVALENT 25 MCG/0.5ML IJ INJ
0.5000 mL | INJECTION | INTRAMUSCULAR | Status: DC
  Filled 2013-06-16: qty 0.5

## 2013-06-16 MED ORDER — PRENATAL MULTIVITAMIN CH
1.0000 | ORAL_TABLET | Freq: Every day | ORAL | Status: DC
  Administered 2013-06-16 – 2013-06-18 (×3): 1 via ORAL
  Filled 2013-06-16 (×3): qty 1

## 2013-06-16 MED ORDER — IBUPROFEN 600 MG PO TABS
600.0000 mg | ORAL_TABLET | Freq: Four times a day (QID) | ORAL | Status: DC
  Administered 2013-06-16 – 2013-06-18 (×10): 600 mg via ORAL
  Filled 2013-06-16 (×10): qty 1

## 2013-06-16 MED ORDER — CITRIC ACID-SODIUM CITRATE 334-500 MG/5ML PO SOLN: 30.0000 mL | ORAL | Status: DC | PRN

## 2013-06-16 MED ORDER — PHENYLEPHRINE 40 MCG/ML (10ML) SYRINGE FOR IV PUSH (FOR BLOOD PRESSURE SUPPORT)
80.0000 ug | PREFILLED_SYRINGE | INTRAVENOUS | Status: DC | PRN
  Filled 2013-06-16: qty 2

## 2013-06-16 MED ORDER — FENTANYL 2.5 MCG/ML BUPIVACAINE 1/10 % EPIDURAL INFUSION (WH - ANES): 14.0000 mL/h | INTRAMUSCULAR | Status: DC | PRN

## 2013-06-16 MED ORDER — BENZOCAINE-MENTHOL 20-0.5 % EX AERO: 1.0000 "application " | INHALATION_SPRAY | CUTANEOUS | Status: DC | PRN

## 2013-06-16 MED ORDER — OXYTOCIN 40 UNITS IN LACTATED RINGERS INFUSION - SIMPLE MED: 62.5000 mL/h | INTRAVENOUS | Status: DC | PRN

## 2013-06-16 MED ORDER — IBUPROFEN 600 MG PO TABS: 600.0000 mg | ORAL_TABLET | Freq: Four times a day (QID) | ORAL | Status: DC | PRN

## 2013-06-16 MED ORDER — ONDANSETRON HCL 4 MG/2ML IJ SOLN: 4.0000 mg | Freq: Four times a day (QID) | INTRAMUSCULAR | Status: DC | PRN

## 2013-06-16 MED ORDER — OXYCODONE-ACETAMINOPHEN 5-325 MG PO TABS: 1.0000 | ORAL_TABLET | ORAL | Status: DC | PRN

## 2013-06-16 MED ORDER — FLEET ENEMA 7-19 GM/118ML RE ENEM: 1.0000 | ENEMA | Freq: Every day | RECTAL | Status: DC | PRN

## 2013-06-16 MED ORDER — FERROUS SULFATE 325 (65 FE) MG PO TABS
325.0000 mg | ORAL_TABLET | Freq: Two times a day (BID) | ORAL | Status: DC
  Administered 2013-06-16 – 2013-06-17 (×2): 325 mg via ORAL
  Filled 2013-06-16 (×6): qty 1

## 2013-06-16 MED ORDER — WITCH HAZEL-GLYCERIN EX PADS: 1.0000 "application " | MEDICATED_PAD | CUTANEOUS | Status: DC | PRN

## 2013-06-16 MED ORDER — DIBUCAINE 1 % RE OINT: 1.0000 "application " | TOPICAL_OINTMENT | RECTAL | Status: DC | PRN

## 2013-06-16 MED ORDER — DIPHENHYDRAMINE HCL 50 MG/ML IJ SOLN: 12.5000 mg | INTRAMUSCULAR | Status: DC | PRN

## 2013-06-16 MED ORDER — SENNOSIDES-DOCUSATE SODIUM 8.6-50 MG PO TABS
2.0000 | ORAL_TABLET | ORAL | Status: DC
  Administered 2013-06-17 (×2): 2 via ORAL
  Filled 2013-06-16 (×2): qty 2

## 2013-06-16 MED ORDER — BISACODYL 10 MG RE SUPP: 10.0000 mg | Freq: Every day | RECTAL | Status: DC | PRN

## 2013-06-16 MED ORDER — TETANUS-DIPHTH-ACELL PERTUSSIS 5-2.5-18.5 LF-MCG/0.5 IM SUSP: 0.5000 mL | Freq: Once | INTRAMUSCULAR | Status: DC

## 2013-06-16 MED ORDER — LACTATED RINGERS IV SOLN
500.0000 mL | INTRAVENOUS | Status: DC | PRN
  Administered 2013-06-16: 500 mL via INTRAVENOUS

## 2013-06-16 MED ORDER — EPHEDRINE 5 MG/ML INJ
10.0000 mg | INTRAVENOUS | Status: DC | PRN
  Filled 2013-06-16: qty 2

## 2013-06-16 MED ORDER — LACTATED RINGERS IV SOLN
INTRAVENOUS | Status: DC
  Administered 2013-06-16: 02:00:00 via INTRAVENOUS

## 2013-06-16 MED ORDER — ONDANSETRON HCL 4 MG PO TABS
4.0000 mg | ORAL_TABLET | ORAL | Status: DC | PRN
  Administered 2013-06-16: 4 mg via ORAL
  Filled 2013-06-16: qty 1

## 2013-06-16 MED ORDER — MEASLES, MUMPS & RUBELLA VAC ~~LOC~~ INJ
0.5000 mL | INJECTION | Freq: Once | SUBCUTANEOUS | Status: DC
  Filled 2013-06-16: qty 0.5

## 2013-06-16 MED ORDER — FENTANYL CITRATE 0.05 MG/ML IJ SOLN: 100.0000 ug | INTRAMUSCULAR | Status: DC | PRN

## 2013-06-16 MED ORDER — ACETAMINOPHEN 325 MG PO TABS: 650.0000 mg | ORAL_TABLET | ORAL | Status: DC | PRN

## 2013-06-16 MED ORDER — LIDOCAINE HCL (PF) 1 % IJ SOLN
INTRAMUSCULAR | Status: DC | PRN
  Administered 2013-06-16 (×2): 9 mL

## 2013-06-16 MED ORDER — OXYCODONE-ACETAMINOPHEN 5-325 MG PO TABS
1.0000 | ORAL_TABLET | ORAL | Status: DC | PRN
  Administered 2013-06-16: 1 via ORAL
  Administered 2013-06-16 – 2013-06-18 (×6): 2 via ORAL
  Filled 2013-06-16 (×2): qty 2
  Filled 2013-06-16: qty 1
  Filled 2013-06-16 (×4): qty 2

## 2013-06-16 MED ORDER — LIDOCAINE HCL (PF) 1 % IJ SOLN
30.0000 mL | INTRAMUSCULAR | Status: DC | PRN
  Filled 2013-06-16 (×2): qty 30

## 2013-06-16 MED ORDER — ONDANSETRON HCL 4 MG/2ML IJ SOLN: 4.0000 mg | INTRAMUSCULAR | Status: DC | PRN

## 2013-06-16 MED ORDER — METHYLERGONOVINE MALEATE 0.2 MG PO TABS: 0.2000 mg | ORAL_TABLET | ORAL | Status: DC | PRN

## 2013-06-16 MED ORDER — OXYTOCIN BOLUS FROM INFUSION: 500.0000 mL | INTRAVENOUS | Status: DC

## 2013-06-16 MED ORDER — LANOLIN HYDROUS EX OINT: TOPICAL_OINTMENT | CUTANEOUS | Status: DC | PRN

## 2013-06-16 MED ORDER — FENTANYL 2.5 MCG/ML BUPIVACAINE 1/10 % EPIDURAL INFUSION (WH - ANES)
INTRAMUSCULAR | Status: DC | PRN
  Administered 2013-06-16: 14 mL/h via EPIDURAL

## 2013-06-16 MED ORDER — LACTATED RINGERS IV SOLN
500.0000 mL | Freq: Once | INTRAVENOUS | Status: AC
  Administered 2013-06-16: 1000 mL via INTRAVENOUS

## 2013-06-16 MED ORDER — DIPHENHYDRAMINE HCL 25 MG PO CAPS: 25.0000 mg | ORAL_CAPSULE | Freq: Four times a day (QID) | ORAL | Status: DC | PRN

## 2013-06-16 MED ORDER — SIMETHICONE 80 MG PO CHEW: 80.0000 mg | CHEWABLE_TABLET | ORAL | Status: DC | PRN

## 2013-06-16 MED ORDER — OXYTOCIN 40 UNITS IN LACTATED RINGERS INFUSION - SIMPLE MED
62.5000 mL/h | INTRAVENOUS | Status: DC
  Administered 2013-06-16: 62.5 mL/h via INTRAVENOUS
  Filled 2013-06-16: qty 1000

## 2013-06-16 MED ORDER — METHYLERGONOVINE MALEATE 0.2 MG/ML IJ SOLN: 0.2000 mg | INTRAMUSCULAR | Status: DC | PRN

## 2013-06-16 NOTE — Anesthesia Procedure Notes (Signed)
Epidural Patient location during procedure: OB Start time: 06/16/2013 2:42 AM End time: 06/16/2013 2:46 AM  Staffing Anesthesiologist: Leilani Able Performed by: anesthesiologist   Preanesthetic Checklist Completed: patient identified, surgical consent, pre-op evaluation, timeout performed, IV checked, risks and benefits discussed and monitors and equipment checked  Epidural Patient position: sitting Prep: site prepped and draped and DuraPrep Patient monitoring: continuous pulse ox and blood pressure Approach: midline Injection technique: LOR air  Needle:  Needle type: Tuohy  Needle gauge: 17 G Needle length: 9 cm and 9 Needle insertion depth: 4 cm Catheter type: closed end flexible Catheter size: 19 Gauge Catheter at skin depth: 9 cm Test dose: negative and Other  Assessment Sensory level: T9 Events: blood not aspirated, injection not painful, no injection resistance, negative IV test and no paresthesia  Additional Notes Reason for block:procedure for pain

## 2013-06-16 NOTE — Anesthesia Preprocedure Evaluation (Signed)
Anesthesia Evaluation  Patient identified by MRN, date of birth, ID band Patient awake    Reviewed: Allergy & Precautions, H&P , NPO status , Patient's Chart, lab work & pertinent test results  Airway Mallampati: I TM Distance: >3 FB Neck ROM: full    Dental no notable dental hx.    Pulmonary Current Smoker,    Pulmonary exam normal       Cardiovascular negative cardio ROS      Neuro/Psych    GI/Hepatic negative GI ROS, Neg liver ROS,   Endo/Other    Renal/GU   negative genitourinary   Musculoskeletal   Abdominal Normal abdominal exam  (+)   Peds  Hematology   Anesthesia Other Findings   Reproductive/Obstetrics (+) Pregnancy                           Anesthesia Physical Anesthesia Plan  ASA: II  Anesthesia Plan: Epidural   Post-op Pain Management:    Induction:   Airway Management Planned:   Additional Equipment:   Intra-op Plan:   Post-operative Plan:   Informed Consent: I have reviewed the patients History and Physical, chart, labs and discussed the procedure including the risks, benefits and alternatives for the proposed anesthesia with the patient or authorized representative who has indicated his/her understanding and acceptance.     Plan Discussed with:   Anesthesia Plan Comments:         Anesthesia Quick Evaluation

## 2013-06-16 NOTE — Progress Notes (Signed)
CSW attempted to meet with MOB to complete assessment for hx of Anxiety, but she had numerous visitors with her at this time.  CSW asked if MOB would like CSW to return at a later time and she said yes.  CSW asked her how she is doing and if she needs anything at this time and she states she is doing well and declines needs at this time.

## 2013-06-16 NOTE — Anesthesia Postprocedure Evaluation (Signed)
Anesthesia Post Note  Patient: Eileen [name]  Procedure(s) Performed: * No procedures listed *  Anesthesia type: Epidural  Patient location: Mother/Baby  Post pain: Pain level controlled  Post assessment: Post-op Vital signs reviewed  Last Vitals:  Filed Vitals:   06/16/13 1730  BP: 105/66  Pulse: 77  Temp: 36.9 C  Resp: 14    Post vital signs: Reviewed  Level of consciousness:alert  Complications: No apparent anesthesia complications

## 2013-06-16 NOTE — Progress Notes (Signed)
Resistance met when removing epidural contacted CRNA to remove

## 2013-06-16 NOTE — H&P (Signed)
Eileen [name] is a 22 y.o. female [phone] with IUP at [redacted]w[redacted]d presenting for contractions. Pt states she has been having irregular, every 3-6 minutes contractions, associated with none vaginal bleeding.  Membranes are intact, with active fetal movement.   [facility] at [facility] since 24 wks (transferred from [place]).  She has been having some intermittent chest pain asso with palpitations.  No rradiation fo pain, no substernal chest pain, no diaphoresis, no syncope.    Prenatal History/Complications: Mitral valve prolapse (only symptomatic during pg) Hypothyroid (not on meds during pg)  Past Medical History: Past Medical History  Diagnosis Date  . Dysuria   . Headache(784.0)   . Allergy   . Anemia   . Anxiety   . Hypothyroid     history of.  . Chronic kidney disease     kidney infection.  . Abortion 08/04/2011  . Mitral valve prolapse     Past Surgical History: Past Surgical History  Procedure Laterality Date  . Dilation and curettage of uterus      abortion    Obstetrical History: OB History   Grav Para Term Preterm Abortions TAB SAB Ect Mult Living   5 2 2  0 2 2  0 0 2        Social History: History   Social History  . Marital Status: Married    Spouse Name: N/A    Number of Children: N/A  . Years of Education: N/A   Social History Main Topics  . Smoking status: Current Every Day Smoker -- 0.50 packs/day  . Smokeless tobacco: Never Used  . Alcohol Use: Yes     Comment: occasional but not while pregnant  . Drug Use: No  . Sexual Activity: Yes    Partners: Male    Birth Control/ Protection: None   Other Topics Concern  . None   Social History Narrative  . None    Family History: Family History  Problem Relation Age of Onset  . Cancer Mother     SKIN  . Asthma Brother   . Diabetes Maternal Grandmother   . Emphysema Paternal Grandfather     Allergies: Allergies  Allergen Reactions  . Other Hives and Nausea And Vomiting    Green beans  .  Red Dye Nausea And Vomiting  . Sulfa Antibiotics Hives and Swelling    Childhood reaction    Prescriptions prior to admission  Medication Sig Dispense Refill  . hydrOXYzine (ATARAX/VISTARIL) 25 MG tablet Take 1 tablet (25 mg total) by mouth every 8 (eight) hours as needed.  30 tablet  1  . triamcinolone ointment (KENALOG) 0.1 % Apply 1 application topically 2 (two) times daily.  30 g  0     Review of Systems   Constitutional: Negative for fever and chills Eyes: Negative for visual disturbances Respiratory: Negative for shortness of breath, dyspnea Cardiovascular: Negative for chest pain or palpitations  Gastrointestinal: Negative for vomiting, diarrhea and constipation Genitourinary: Negative for dysuria and urgency Musculoskeletal: Negative for back pain, joint pain, myalgias  Neurological: Negative for dizziness and headaches    Blood pressure 117/70, pulse 102, resp. rate 18, height 4\' 11"  (1.499 m), weight 58.968 kg (130 lb), last menstrual period 09/19/2012, SpO2 100.00%. General appearance: alert, cooperative and no distress Lungs: clear to auscultation bilaterally Heart: regular rate and rhythm Abdomen: soft, non-tender; bowel sounds normal Pelvic: Was 2/70  in office yesterday.  4/70/-3/posterior upon arrival to MAU Extremities: Homans sign is negative, no sign of DVT DTR's 2+  Presentation: cephalic Fetal monitoringBaseline: 150 bpm, Variability: Good {> 6 bpm), Accelerations: Reactive and Decelerations: Absent Uterine activity q 2-3 minutes/ palpate moderate .  After 1 hour of walking, recheck cx: Dilation: 4.5 Effacement (%): 90 Station: -1 Exam by:: cres-dishmon,CNM   Prenatal labs: ABO, Rh:  O+ Antibody:  negative Rubella:  immune RPR: NON REAC (09/04 1050)  HBsAg:   neg HIV: NON REACTIVE (09/04 1050)  GBS:   neg 1 hr Glucola 92 Genetic screening  declined Anatomy US normal   Assessment: Eileen [name] is a 22 y.o. [id number] with an IUP at [redacted]w[redacted]d  presenting for early labor  Plan:  Pt lives 30 minutes away.  Will go ahead and admit; Expectant management   CRESENZO-DISHMAN,Jian Hodgman 06/16/2013, 1:11 AM

## 2013-06-17 MED ORDER — PRENATAL MULTIVITAMIN CH: 1.0000 | ORAL_TABLET | Freq: Every day | ORAL | Status: DC

## 2013-06-17 MED ORDER — IBUPROFEN 600 MG PO TABS: 600.0000 mg | ORAL_TABLET | Freq: Four times a day (QID) | ORAL | Status: DC | PRN

## 2013-06-17 MED ORDER — FERROUS SULFATE 325 (65 FE) MG PO TABS: 325.0000 mg | ORAL_TABLET | Freq: Two times a day (BID) | ORAL | Status: DC

## 2013-06-17 NOTE — Progress Notes (Signed)
Clinical Social Work Department PSYCHOSOCIAL ASSESSMENT - MATERNAL/CHILD 06/17/2013  Patient:  Eileen [name],Eileen [name]  Account Number:  [id number]  Admit Date:  06/15/2013  Childs Name:   Alaina Eileen [name]    Clinical Social Worker:  Delquan Poucher, LCSW   Date/Time:  06/17/2013 01:40 PM  Date Referred:     Referral source  Central Nurs[facility]     Referred reason  LPNC   Other referral source:    I:  FAMILY / HOME ENVIRONMENT Child's legal guardian:  PARENT  Guardian - Name Guardian - Age Guardian - Address  Eileen [name],Eileen [name] 22 [address]  [place], [place] [address]  Eileen [name], Eileen [name]     Other household support members/support persons Other support:   Extensive family support    II  PSYCHOSOCIAL DATA Information Source:    Financial and Community Resources Employment:   Father recently gained employment  Mother is researching financial benefits offered through the [employer]   Financial resources:  Self Pay If Medicaid - County:    School / Grade:   Maternity Care Coordinator / Child Services Coordination / Early Interventions:  Cultural issues impacting care:    III  STRENGTHS Strengths  Supportive family/friends  Home prepared for Child (including basic supplies)  Adequate Resources   Strength comment:    IV  RISK FACTORS AND CURRENT PROBLEMS Current Problem:       V  SOCIAL WORK ASSESSMENT Acknowledged order for Social Work consult to assess mother's history of anxiety and limited PNC.   Met with mother who was pleasant and receptive to social work intervention.   Several relatives were present and was attentive to mom and baby.  Parents are married but separated.  Mother has two other dependents ages 4 and 3. Mother states that she was diagnosed with depression about 2 years ago and prescribed an antidepressant.  However, she stop taking the medication after one week because "it made me feel worse".  Informed that she has been doing fine without medication and denies any  currently symptoms of depression.  Mother reports extensive family support. Informed that maternal grandmother and maternal great grandmother both live across the street from her.  She also has a cousin that offered to stay with her as long as needed.  Discussed signs/symptoms of PP depression with mother, and provided her with information on where to seek treatment if needed.   UDS on newborn was negative.  Mother informed of reason for the UDS.  Mother notes that she started PNC early during the pregnancy in [place]. Informed that PNC started in [place] at 27 weeks. Spouse was in the [employer] but was reportedly "kicked" out. Informed that she no longer has Tricare insurance and has appointment on Monday to apply for Medicaid and foodstamps. No acute social concerns related at this time.   Mother informed of social work availability.      VI SOCIAL WORK PLAN  Type of pt/family education:   If child protective services report - county:   If child protective services report - date:   Information/referral to community resources comment:   Pediatrician:  [facility]   Other social work plan:   CSW will follow PRN.    Brennley Curtice J, LCSW  

## 2013-06-17 NOTE — Discharge Summary (Signed)
Attestation of Attending Supervision of Advanced Practitioner (PA/CNM/NP): Evaluation and management procedures were performed by the Advanced Practitioner under my supervision and collaboration.  I have reviewed the Advanced Practitioner's note and chart, and I agree with the management and plan.  Shanell Aden, MD, FACOG Attending Obstetrician & Gynecologist Faculty Practice, [facility]  

## 2013-06-17 NOTE — Discharge Summary (Signed)
Obstetric Discharge Summary Reason for Admission: onset of labor Prenatal Procedures: ultrasound Intrapartum Procedures: spontaneous vaginal delivery Postpartum Procedures: none Complications-Operative and Postpartum: none Eating, drinking, voiding, ambulating well.  +flatus.  Lochia and pain wnl.  Denies dizziness, lightheadedness, or sob. No complaints. Desires early d/c  Hemoglobin  Date Value Range Status  06/16/2013 9.5* 12.0 - 15.0 g/dL Final     HCT  Date Value Range Status  06/16/2013 28.5* 36.0 - 46.0 % Final    Physical Exam:  General: alert, cooperative and no distress Lochia: appropriate Uterine Fundus: firm Incision: n/a DVT Evaluation: No evidence of DVT seen on physical exam. Negative Homan's sign. No cords or calf tenderness. No significant calf/ankle edema.  Discharge Diagnoses: Term Pregnancy-delivered  Discharge Information: Date: 06/17/2013 Activity: pelvic rest Diet: routine Medications: PNV, Ibuprofen and Iron Condition: stable Instructions: refer to practice specific booklet Discharge to: home Follow-up Information   Schedule an appointment as soon as possible for a visit with Center for [facility]. (4-6 weeks for your postpartum visit, and one for your nexplanon or mirena insertion)    Specialty:  Obstetrics and Gynecology   Contact information:   [address] [place] [address] [phone]      Newborn Data: Live born female  Birth Weight: 6 lb 1.9 oz (2775 g) APGAR: 8, 9  Home with mother pending d/c from ped. Bottlefeeding. Contemplating between nexplanon and mirena for contraception. Understands to remain abstinent until contraception is placed.  To be d/c'd after SW consult for h/o anxiety- SW came yesterday, but pt had visitors, so she was going to come back. Pt reports having 3 living children now, and has custody of all.   Eileen [name] 06/17/2013, 9:28 AM

## 2013-06-17 NOTE — Lactation Note (Signed)
This note was copied from the chart of Eileen [name]. Lactation Consultation Note  Patient Name: Eileen [name] [id number] Date: 06/17/2013 Reason for consult: Initial assessment;Other (Comment) (charting for exclusion)   Maternal Data Formula Feeding for Exclusion: Yes Reason for exclusion: Mother's choice to formula feed on admision  Feeding Feeding Type: Bottle Fed - Formula  LATCH Score/Interventions                      Lactation Tools Discussed/Used     Consult Status Consult Status: Complete    Lynda Rainwater 06/17/2013, 4:07 PM

## 2013-06-18 NOTE — Discharge Summary (Signed)
Attestation of Attending Supervision of Advanced Practitioner (CNM/NP): Evaluation and management procedures were performed by the Advanced Practitioner under my supervision and collaboration. I have reviewed the Advanced Practitioner's note and chart, and I agree with the management and plan.  LEGGETT,KELLY H. 5:51 PM   

## 2013-06-18 NOTE — Discharge Summary (Signed)
Obstetric Discharge Summary Reason for Admission: onset of labor Prenatal Procedures: none Intrapartum Procedures: spontaneous vaginal delivery Postpartum Procedures: none Complications-Operative and Postpartum: none Hemoglobin  Date Value Range Status  06/16/2013 9.5* 12.0 - 15.0 g/dL Final     HCT  Date Value Range Status  06/16/2013 28.5* 36.0 - 46.0 % Final   [name] is a 22yo [id number] at 38.4wks who was admitted shortly after midnight on 11/21 in early active labor. She progressed in labor to SVD in the early morning of the same day. By PPD#2 she has been deemed to have received the full benefit of her hospital stay and she will be discharged. She is bottlefeeding and desires Mirena for contraception. She has an appt to apply for MCD tomorrow (she had Tricare ins but not any longer) and will be in touch with [facility] re the status of that and plans for a Mirena will be made accordingly. She had a SW visit while here due to a hx of anxiety and depression and was determined not to have any barriers to discharge.  Physical Exam:  General: alert, cooperative and no distress Heart: RRR Lungs: nl effort Lochia: appropriate Uterine Fundus: firm DVT Evaluation: No evidence of DVT seen on physical exam.  Discharge Diagnoses: Term Pregnancy-delivered  Discharge Information: Date: 06/18/2013 Activity: pelvic rest Diet: routine Medications: PNV and Ibuprofen Condition: stable Instructions: refer to practice specific booklet Discharge to: home Follow-up Information   Schedule an appointment as soon as possible for a visit with Center for [employer] [facility]. (4-6 weeks for your postpartum visit, and one for your nexplanon or mirena insertion)    Specialty:  Obstetrics and Gynecology   Contact information:   [address] [place] [address] [phone]      Newborn Data: Live born female  Birth Weight: 6 lb 1.9 oz (2775 g) APGAR: 8, 9  Home with  mother.  Cam Hai 06/18/2013, 10:18 AM

## 2013-06-18 NOTE — H&P (Signed)
Attestation of Attending Supervision of Advanced Practitioner (CNM/NP): Evaluation and management procedures were performed by the Advanced Practitioner under my supervision and collaboration. I have reviewed the Advanced Practitioner's note and chart, and I agree with the management and plan.  Corry Storie H. 5:51 PM   

## 2013-06-20 ENCOUNTER — Encounter: Admitting: Obstetrics & Gynecology

## 2013-07-24 ENCOUNTER — Ambulatory Visit: Admitting: Obstetrics & Gynecology

## 2013-07-26 ENCOUNTER — Ambulatory Visit: Admitting: Obstetrics and Gynecology

## 2013-07-26 DIAGNOSIS — Z30430 Encounter for insertion of intrauterine contraceptive device: Secondary | ICD-10-CM

## 2013-08-01 ENCOUNTER — Encounter: Payer: Self-pay | Admitting: Family Medicine

## 2013-08-01 ENCOUNTER — Ambulatory Visit (INDEPENDENT_AMBULATORY_CARE_PROVIDER_SITE_OTHER): Payer: 59 | Admitting: Family Medicine

## 2013-08-01 VITALS — BP 111/85 | HR 86 | Ht 59.0 in | Wt 113.0 lb

## 2013-08-01 DIAGNOSIS — F411 Generalized anxiety disorder: Secondary | ICD-10-CM

## 2013-08-01 DIAGNOSIS — F419 Anxiety disorder, unspecified: Secondary | ICD-10-CM | POA: Insufficient documentation

## 2013-08-01 NOTE — Patient Instructions (Signed)
Etonogestrel implant What is this medicine? ETONOGESTREL (et oh noe JES trel) is a contraceptive (birth control) device. It is used to prevent pregnancy. It can be used for up to 3 years. This medicine may be used for other purposes; ask your health care provider or pharmacist if you have questions. COMMON BRAND NAME(S): Implanon, Nexplanon  What should I tell my health care provider before I take this medicine? They need to know if you have any of these conditions: -abnormal vaginal bleeding -blood vessel disease or blood clots -cancer of the breast, cervix, or liver -depression -diabetes -gallbladder disease -headaches -heart disease or recent heart attack -high blood pressure -high cholesterol -kidney disease -liver disease -renal disease -seizures -tobacco smoker -an unusual or allergic reaction to etonogestrel, other hormones, anesthetics or antiseptics, medicines, foods, dyes, or preservatives -pregnant or trying to get pregnant -breast-feeding How should I use this medicine? This device is inserted just under the skin on the inner side of your upper arm by a health care professional. Talk to your pediatrician regarding the use of this medicine in children. Special care may be needed. Overdosage: If you think you've taken too much of this medicine contact a poison control center or emergency room at once. Overdosage: If you think you have taken too much of this medicine contact a poison control center or emergency room at once. NOTE: This medicine is only for you. Do not share this medicine with others. What if I miss a dose? This does not apply. What may interact with this medicine? Do not take this medicine with any of the following medications: -amprenavir -bosentan -fosamprenavir This medicine may also interact with the following medications: -barbiturate medicines for inducing sleep or treating seizures -certain medicines for fungal infections like ketoconazole and  itraconazole -griseofulvin -medicines to treat seizures like carbamazepine, felbamate, oxcarbazepine, phenytoin, topiramate -modafinil -phenylbutazone -rifampin -some medicines to treat HIV infection like atazanavir, indinavir, lopinavir, nelfinavir, tipranavir, ritonavir -St. John's wort This list may not describe all possible interactions. Give your health care provider a list of all the medicines, herbs, non-prescription drugs, or dietary supplements you use. Also tell them if you smoke, drink alcohol, or use illegal drugs. Some items may interact with your medicine. What should I watch for while using this medicine? This product does not protect you against HIV infection (AIDS) or other sexually transmitted diseases. You should be able to feel the implant by pressing your fingertips over the skin where it was inserted. Tell your doctor if you cannot feel the implant. What side effects may I notice from receiving this medicine? Side effects that you should report to your doctor or health care professional as soon as possible: -allergic reactions like skin rash, itching or hives, swelling of the face, lips, or tongue -breast lumps -changes in vision -confusion, trouble speaking or understanding -dark urine -depressed mood -general ill feeling or flu-like symptoms -light-colored stools -loss of appetite, nausea -right upper belly pain -severe headaches -severe pain, swelling, or tenderness in the abdomen -shortness of breath, chest pain, swelling in a leg -signs of pregnancy -sudden numbness or weakness of the face, arm or leg -trouble walking, dizziness, loss of balance or coordination -unusual vaginal bleeding, discharge -unusually weak or tired -yellowing of the eyes or skin Side effects that usually do not require medical attention (Report these to your doctor or health care professional if they continue or are bothersome.): -acne -breast pain -changes in  weight -cough -fever or chills -headache -irregular menstrual bleeding -itching, burning,   and vaginal discharge -pain or difficulty passing urine -sore throat This list may not describe all possible side effects. Call your doctor for medical advice about side effects. You may report side effects to FDA at 1-800-FDA-1088. Where should I keep my medicine? This drug is given in a hospital or clinic and will not be stored at home. NOTE: This sheet is a summary. It may not cover all possible information. If you have questions about this medicine, talk to your doctor, pharmacist, or health care provider.  2014, Elsevier/Gold Standard. (2012-01-18 15:37:45)  

## 2013-08-01 NOTE — Progress Notes (Signed)
  Subjective:     Eileen [name] is a 23 y.o. female who presents for a postpartum visit. She is 6 weeks postpartum following a spontaneous vaginal delivery. I have fully reviewed the prenatal and intrapartum course. The delivery was at 25 gestational weeks. Outcome: spontaneous vaginal delivery. Anesthesia: epidural. Postpartum course has been unremarkable. 87 course has been normal. Baby is feeding by bottle - [employer] Good Start. Bleeding no bleeding. Bowel function is normal. Bladder function is normal. Patient is sexually active. Contraception method is none. Postpartum depression screening: negative. She does have anxiety, but no insurance.  The following portions of the patient's history were reviewed and updated as appropriate: allergies, current medications, past family history, past medical history, past social history, past surgical history and problem list.  Review of Systems Pertinent items are noted in HPI.   Objective:    BP 111/85  Pulse 86  Ht 4\' 11"  (1.499 m)  Wt 113 lb (51.256 kg)  BMI 22.81 kg/m2  LMP 07/24/2013  Breastfeeding? No  General:  alert, cooperative and appears stated age  Abdomen: soft, non-tender; bowel sounds normal; no masses,  no organomegaly   Vulva:  normal  Vagina: normal vagina  Cervix:  no lesions  Corpus: normal size, contour, position, consistency, mobility, non-tender  Adnexa:  normal adnexa        Assessment:     Normal postpartum exam. Pap smear not done at today's visit.   Plan:    1. Contraception: Nexplanon. 2. Needs pap at next visit. 3. Follow up in: 1 week for Nexplanon and pap or as needed. 4.  Referral for anxiety.

## 2013-08-10 ENCOUNTER — Encounter: Payer: Self-pay | Admitting: Obstetrics & Gynecology

## 2013-08-10 ENCOUNTER — Ambulatory Visit (INDEPENDENT_AMBULATORY_CARE_PROVIDER_SITE_OTHER): Payer: 59 | Admitting: Obstetrics & Gynecology

## 2013-08-10 VITALS — BP 120/75 | HR 84 | Ht 59.0 in | Wt 112.0 lb

## 2013-08-10 DIAGNOSIS — Z01812 Encounter for preprocedural laboratory examination: Secondary | ICD-10-CM

## 2013-08-10 DIAGNOSIS — Z30017 Encounter for initial prescription of implantable subdermal contraceptive: Secondary | ICD-10-CM

## 2013-08-10 LAB — POCT URINE PREGNANCY: PREG TEST UR: NEGATIVE

## 2013-08-10 NOTE — Progress Notes (Signed)
   Subjective:    [name], female    DOB: 1990/12/14, 23 y.o.   MRN: [id number]  HPI  23yo MW P3 here today for Nexplanon insertion. Her Mirena fell out and she got pregnant (2nd child). She had the Implanon for a total of 4 years and was happy with it. She had some acne and headaches but is willing to put up with that. She understands irregular bleeding is common.  Review of Systems     Objective:   Physical Exam  UPT was negative. Consent was signed. Time out procedure was done. Her left arm was prepped with betadine and infiltrated with 3 cc of 1% lidocaine. After adequate anesthesia was assured, the Nexplanon device was placed according to standard of care. Her arm was hemostatic and was bandaged. She tolerated the procedure well.      Assessment & Plan:  Contraception- Nexplanon

## 2013-08-18 ENCOUNTER — Ambulatory Visit: Payer: Self-pay | Admitting: Obstetrics & Gynecology

## 2013-09-22 ENCOUNTER — Encounter (HOSPITAL_COMMUNITY): Payer: Self-pay | Admitting: Emergency Medicine

## 2013-09-22 ENCOUNTER — Emergency Department (HOSPITAL_COMMUNITY)
Admission: EM | Admit: 2013-09-22 | Discharge: 2013-09-22 | Disposition: A | Discharge status: Home / Self Care | Payer: 59 | Source: Home / Self Care | Attending: Emergency Medicine | Admitting: Emergency Medicine

## 2013-09-22 DIAGNOSIS — J069 Acute upper respiratory infection, unspecified: Secondary | ICD-10-CM

## 2013-09-22 MED ORDER — TRAMADOL HCL 50 MG PO TABS: ORAL_TABLET | ORAL | Status: DC

## 2013-09-22 MED ORDER — ALBUTEROL SULFATE HFA 108 (90 BASE) MCG/ACT IN AERS: 2.0000 | INHALATION_SPRAY | Freq: Four times a day (QID) | RESPIRATORY_TRACT | Status: DC

## 2013-09-22 NOTE — Discharge Instructions (Signed)
Most upper respiratory infections are caused by viruses and do not require antibiotics.  We try to save the antibiotics for when we really need them to prevent bacteria from developing resistance to them.  Here are a few hints about things that can be done at home to help get over an upper respiratory infection quicker:  Get extra sleep and extra fluids.  Get 7 to 9 hours of sleep per night and 6 to 8 glasses of water a day.  Getting extra sleep keeps the immune system from getting run down.  Most people with an upper respiratory infection are a little dehydrated.  The extra fluids also keep the secretions liquified and easier to deal with.  Also, get extra vitamin C.  4000 mg per day is the recommended dose. For the aches, headache, and fever, acetaminophen or ibuprofen are helpful.  These can be alternated every 4 hours.  People with liver disease should avoid large amounts of acetaminophen, and people with ulcer disease, gastroesophageal reflux, gastritis, congestive heart failure, chronic kidney disease, coronary artery disease and the elderly should avoid ibuprofen. For nasal congestion try Mucinex-D, or if you're having lots of sneezing or clear nasal drainage use Zyrtec-D. People with high blood pressure can take these if their blood pressure is controlled, if not, it's best to avoid the forms with a "D" (decongestants).  You can use the plain Mucinex, Allegra, Claritin, or Zyrtec even if your blood pressure is not controlled.   A Saline nasal spray such as Ocean Spray can also help.  You can add a decongestant sprays such as Afrin, but you should not use the decongestant sprays for more than 3 or 4 days since they can be habituating.  Breathe Rite nasal strips can also offer a non-drug alternative treatment to nasal congestion, especially at night. For people with symptoms of sinusitis, sleeping with your head elevated can be helpful.  For sinus pain, moist, hot compresses to the face may provide some  relief.  Many people find that inhaling steam as in a shower or from a pot of steaming water can help. For any viral infection, zinc containing lozenges such as Cold-Eze or Zicam are helpful.  Zinc helps to fight viral infection.  Hot salt water gargles (8 oz of hot water, 1/2 tsp of table salt, and a pinch of baking soda) can give relief as well as hot beverages such as hot tea.  Sucrets extra strength lozenges will help the sore throat.  For the cough, take Delsym 2 tsp every 12 hours.  It has also been found recently that Aleve can help control a cough.  The dose is 1 to 2 tablets twice daily with food.  This can be combined with Delsym. (Note, if you are taking ibuprofen, you should not take Aleve as well--take one or the other.) A cool mist vaporizer will help keep your mucous membranes from drying out.   It's important when you have an upper respiratory infection not to pass the infection to others.  This involves being very careful about the following:  Frequent hand washing or use of hand sanitizer, especially after coughing, sneezing, blowing your nose or touching your face, nose or eyes. Do not shake hands or touch anyone and try to avoid touching surfaces that other people use such as doorknobs, shopping carts, telephones and computer keyboards. Use tissues and dispose of them properly in a garbage can or ziplock bag. Cough into your sleeve. Do not let others eat or  drink after you.  It's also important to recognize the signs of serious illness and get evaluated if they occur: Any respiratory infection that lasts more than 7 to 10 days.  Yellow nasal drainage and sputum are not reliable indicators of a bacterial infection, but if they last for more than 1 week, see your doctor. Fever and sore throat can indicate strep. Fever and cough can indicate influenza or pneumonia. Any kind of severe symptom such as difficulty breathing, intractable vomiting, or severe pain should prompt you to see  a doctor as soon as possible.   Your body's immune system is really the thing that will get rid of this infection.  Your immune system is comprised of 2 types of specialized cells called T cells and B cells.  T cells coordinate the array of cells in your body that engulf invading bacteria or viruses while B cells orchestrate the production of antibodies that neutralize infection.  Anything we do or any medications we give you, will just strengthen your immune system or help it clear up the infection quicker.  Here are a few helpful hints to improve your immune system to help overcome this illness or to prevent future infections:  A few vitamins can improve the health of your immune system.  That's why your diet should include plenty of fruits, vegetables, fish, nuts, and whole grains.  Vitamin A and bet-carotene can increase the cells that fight infections (T cells and B cells).  Vitamin A is abundant in dark greens and orange vegetables such as spinach, greens, sweet potatoes, and carrots.  Vitamin B6 contributes to the maturation of white blood cells, the cells that fight disease.  Foods with vitamin B6 include cold cereal and bananas.  Vitamin C is credited with preventing colds because it increases white blood cells and also prevents cellular damage.  Citrus fruits, peaches and green and red bell peppers are all hight in vitamin C.  Vitamin E is an anti-oxidant that encourages the production of natural killer cells which reject foreign invaders and B cells that produce antibodies.  Foods high in vitamin E include wheat germ, nuts and seeds.  Foods high in omega-3 fatty acids found in foods like salmon, tuna and mackerel boost your immune system and help cells to engulf and absorb germs.  Probiotics are good bacteria that increase your T cells.  These can be found in yogurt and are available in supplements such as Culturelle or Align.  Moderate exercise increases the strength of your immune  system and your ability to recover from illness.  I suggest 3 to 5 moderate intensity 30 minute workouts per week.    Sleep is another component of maintaining a strong immune system.  It enables your body to recuperate from the day's activities, stress and work.  My recommendation is to get between 7 and 9 hours of sleep per night.  If you smoke, try to quit completely or at least cut down.  Drink alcohol only in moderation if at all.  No more than 2 drinks daily for men or 1 for women.  Get a flu vaccine early in the fall or if you have not gotten one yet, once this illness has run its course.  If you are over 65, a smoker, or an asthmatic, get a pneumococcal vaccine.  My final recommendation is to maintain a healthy weight.  Excess weight can impair the immune system by interfering with the way the immune system deals with invading viruses or  bacteria.    How to Use an Inhaler Proper inhaler technique is very important. Good technique ensures that the medicine reaches the lungs. Poor technique results in depositing the medicine on the tongue and back of the throat rather than in the airways. If you do not use the inhaler with good technique, the medicine will not help you. STEPS TO FOLLOW IF USING AN INHALER WITHOUT AN EXTENSION TUBE 1. Remove the cap from the inhaler. 2. If you are using the inhaler for the first time, you will need to prime it. Shake the inhaler for 5 seconds and release four puffs into the air, away from your face. Ask your health care provider or pharmacist if you have questions about priming your inhaler. 3. Shake the inhaler for 5 seconds before each breath in (inhalation). 4. Position the inhaler so that the top of the canister faces up. 5. Put your index finger on the top of the medicine canister. Your thumb supports the bottom of the inhaler. 6. Open your mouth. 7. Either place the inhaler between your teeth and place your lips tightly around the mouthpiece, or hold  the inhaler 1 2 inches away from your open mouth. If you are unsure of which technique to use, ask your health care provider. 8. Breathe out (exhale) normally and as completely as possible. 9. Press the canister down with your index finger to release the medicine. 10. At the same time as the canister is pressed, inhale deeply and slowly until your lungs are completely filled. This should take 4 6 seconds. Keep your tongue down. 11. Hold the medicine in your lungs for 5 10 seconds (10 seconds is best). This helps the medicine get into the small airways of your lungs. 12. Breathe out slowly, through pursed lips. Whistling is an example of pursed lips. 13. Wait at least 15 30 seconds between puffs. Continue with the above steps until you have taken the number of puffs your health care provider has ordered. Do not use the inhaler more than your health care provider tells you. 14. Replace the cap on the inhaler. 15. Follow the directions from your health care provider or the inhaler insert for cleaning the inhaler. STEPS TO FOLLOW IF USING AN INHALER WITH AN EXTENSION (SPACER) 1. Remove the cap from the inhaler. 2. If you are using the inhaler for the first time, you will need to prime it. Shake the inhaler for 5 seconds and release four puffs into the air, away from your face. Ask your health care provider or pharmacist if you have questions about priming your inhaler. 3. Shake the inhaler for 5 seconds before each breath in (inhalation). 4. Place the open end of the spacer onto the mouthpiece of the inhaler. 5. Position the inhaler so that the top of the canister faces up and the spacer mouthpiece faces you. 6. Put your index finger on the top of the medicine canister. Your thumb supports the bottom of the inhaler and the spacer. 7. Breathe out (exhale) normally and as completely as possible. 8. Immediately after exhaling, place the spacer between your teeth and into your mouth. Close your lips tightly  around the spacer. 9. Press the canister down with your index finger to release the medicine. 10. At the same time as the canister is pressed, inhale deeply and slowly until your lungs are completely filled. This should take 4 6 seconds. Keep your tongue down and out of the way. 11. Hold the medicine in your lungs  for 5 10 seconds (10 seconds is best). This helps the medicine get into the small airways of your lungs. Exhale. 12. Repeat inhaling deeply through the spacer mouthpiece. Again hold that breath for up to 10 seconds (10 seconds is best). Exhale slowly. If it is difficult to take this second deep breath through the spacer, breathe normally several times through the spacer. Remove the spacer from your mouth. 13. Wait at least 15 30 seconds between puffs. Continue with the above steps until you have taken the number of puffs your health care provider has ordered. Do not use the inhaler more than your health care provider tells you. 14. Remove the spacer from the inhaler, and place the cap on the inhaler. 15. Follow the directions from your health care provider or the inhaler insert for cleaning the inhaler and spacer. If you are using different kinds of inhalers, use your quick relief medicine to open the airways 10 15 minutes before using a steroid if instructed to do so by your health care provider. If you are unsure which inhalers to use and the order of using them, ask your health care provider, nurse, or respiratory therapist. If you are using a steroid inhaler, always rinse your mouth with water after your last puff, then gargle and spit out the water. Do not swallow the water. AVOID:  Inhaling before or after starting the spray of medicine. It takes practice to coordinate your breathing with triggering the spray.  Inhaling through the nose (rather than the mouth) when triggering the spray. HOW TO DETERMINE IF YOUR INHALER IS FULL OR NEARLY EMPTY You cannot know when an inhaler is empty  by shaking it. A few inhalers are now being made with dose counters. Ask your health care provider for a prescription that has a dose counter if you feel you need that extra help. If your inhaler does not have a counter, ask your health care provider to help you determine the date you need to refill your inhaler. Write the refill date on a calendar or your inhaler canister. Refill your inhaler 7 10 days before it runs out. Be sure to keep an adequate supply of medicine. This includes making sure it is not expired, and that you have a spare inhaler.  SEEK MEDICAL CARE IF:   Your symptoms are only partially relieved with your inhaler.  You are having trouble using your inhaler.  You have some increase in phlegm. SEEK IMMEDIATE MEDICAL CARE IF:   You feel little or no relief with your inhalers. You are still wheezing and are feeling shortness of breath or tightness in your chest or both.  You have dizziness, headaches, or a fast heart rate.  You have chills, fever, or night sweats.  You have a noticeable increase in phlegm production, or there is blood in the phlegm. MAKE SURE YOU:   Understand these instructions.  Will watch your condition.  Will get help right away if you are not doing well or get worse. Document Released: 07/10/2000 Document Revised: 05/03/2013 Document Reviewed: 02/09/2013 [facility] Patient Information 2014 [place], [place].   How to Quit Smoking  According to the U.S. Surgeon General, about 440,000 people in the [place] alone die from complications related to tobacco use.  More deaths occur due to cigarette smoking than illegal drug use, AIDS, car accidents, alcohol-related deaths, suicide and homicide combined.  Smoking accounts for about 30% of all cancer related deaths, including more than 80% of lung cancer deaths. Smoking has  also been linked as the cause of many other diseases like heart disease, bronchitis, emphysema, stroke, and complications of pneumonia  as well as causing an increased risk of miscarriage, premature births, stillbirth, infant death, and low birth weight in infants.  For this reason, the U.S. Surgeon General recommends:  "Smoking cessation (stopping smoking) represents the single most important step that smokers can take to enhance the length and quality of their lives."  Why is it so hard to Quit?  Tobacco products contain Nicotine which is highly addictive - probably as addictive as heroin or cocaine.  Over time, your body becomes both physically and psychologically dependent on it. Finally, attempts to quit smoking are complicated by withdrawal reactions like depression, irritability, trouble sleeping, trouble concentrating, restlessness, headaches, weight gain, and excessive fatigue as well as a lack of support.  These symptoms can last from a few days to several weeks.  Why should you Quit?  Live longer and healthier  Can improves the health of your housemates (children, spouse)  Increases your energy and breathing ability  Lowers risk of heart attack, stroke and cancer  Saves money - For example, if you smoke a pack of cigarettes a day and each pack costs about $3.00, then you will save about $1,100.00 per year, about $5,500. in 5 years and $11,000.00 in 10 years.  What you can do: 1. Talk to your health care provider - There are many smoking cessations aids available, both prescription or over-the-counter.  Check with your doctor and pharmacist before taking any of these products to see which one is best for you.  Develop a plan with your healthcare provider which may include nicotine replacement, prescription medication and/or counseling.  2. Get Started - [place] a start date on your calendar.  Remove cigarettes and ashtrays from your home, car, and office.  Dont be around other smokers.  Stop smokingnot even a puff!  3. Support - Talk to family, friends, and co-workers about your plan to stop smoking.  Ask them not  to smoke around you.  4. Coping Strategies  The four As to help during tough times:  a. Avoid - Avoid other smokers or places where smoking is commonplace.  Avoid alcoholic beverages as these may increase your desire to smoke b. Alter - Change your routine.  Drink water/juices instead of alcohol or coffee.  Take a walk or visit with someone during your coffee break.  Change your route to work. c. Alternatives - Substitute raw vegetables like carrot sticks or celery, sugarless candy or gum for the habit of having a cigarette. d. Activities - Start an exercise program (talk to your doctor prior to beginning any exercise program). Try out a new hands on hobby to distract you from smoking and to keep your hands busy like woodworking or needlepoint.   Additional tips for specific withdrawal symptoms:   Cravings for tobacco:   Distract yourself        Deep-breathing exercises        Remember that cravings are brief    Irritability:    Take a few slow, deep breaths       Soak in a hot bath    Insomnia:    Take a walk several hours before bedtime       Avoid caffeinated beverages after noon       Read a book       Take a warm bath       Banana or warm milk  Increased appetite:   Drink water or low-calorie drinks       Make a survival Kit: Include straws, cinnamon        sticks,        coffee stirrers, licorice, toothpicks, gum, or fresh         vegetables    Inability to concentrate:  Take a brisk walk       Lighten your schedule for a couple of days       Take more breaks   Fatigue:    Get a good nights sleep       Take a nap       Dont overdo it for 2-4 weeks    Constipation, gas,  stomach pain:    Drink plenty of fluids       Increase fiber: fruit, raw vegetables, whole grain        cereals       Talk to your doctor about diet changes    5. Dealing with Relapses - Most relapses occur within the first 2-3 months.  This is common so dont be  discouraged.  Some people may take several attempts before they can quit smoking completely.  6. Reward yourself - Set-up a rewards program for every milestone, like 1st month after quitting, 3rd month after quitting, and 6th month after quitting to keep you motivated.  What your doctor can do: Perform a physical exam and order diagnostic tests like laboratory blood work and a chest X-ray.  This will help to identify health related conditions that might benefit from smoking cessation. Review your health history to make sure there are no contraindications with specific smoking cessation aids like allergies to medications or ingredients in these medications or conflicts with your current medications. Prescribe smoking cessation aids such as: Over-the-counter aid:  Nicotine gum, Nicotine patch Prescription aids:  Nicotine spray, Nicotine inhaler, or Bupropion SR (non-nicotine). Offer or recommend individual or group counseling to support you during the initial quitting and maintenance phase of your smoking cessation program. Offer or recommend other alternative treatments like hypnosis or acupuncture.  What you can expect: Benefits from quitting smoking:  Improved Physical Appearance - Minimizes or stops premature wrinkling of skin, bad breath, stained teeth, gum disease, clothes/hair smoke odors, and yellow fingernails  Improved Daily Activities - Food tastes better, sense of smell improves, and decreases shortness of breath during ordinary activities like climbing stairs, walking, and performing light housework  Decreased Financial Cost - From both no longer purchasing cigarettes and the health care cost for medical treatment of conditions caused by smoking.  Health of Others - Decreases risk of exposing others to the effects of second hand smoke.  Sets an example for youth. Benefits of Quitting according to research from the U.S. Surgeon General:  20 minutes after:  Blood pressure lowers &  Body temperature normalizes  8 hours after: Carbon monoxide levels begins to normalize   24 hours after: Heart attack risk decreases  2 weeks to 3 months after:  Blood flow improves & lung function increases   1 to 9 months after:  Coughing, sinus congestion, fatigue, shortness of breath decrease   1 year after: Risk of developing coronary heart disease is half that of a smoker  5 years after: Risk of a stroke decreases to that of a nonsmoker  10 years after: Risk of death due to lung cancer death is halved.  Risk of oral, throat, esophagus, bladder, kidney, and pancreatic cancer decreases.  15 years after: Risk of developing coronary heart disease is half that of a nonsmoker      References:  Here are references that can provide additional information and support:  [place] (800) ACS-2345       (800) Q2878766 or (800) 602-100-7052 www.cancer.org       www.amhrt.Pharmacist, community [employer] 425-099-2444 or [phone]  (8008106398895 or 380-710-6731 www.lungusa.org       www.medicalacupuncture.[place]     Office on [place] for Disease Control and Prevention (800) 4-CANCER or (800) Y5278638  6166015322 www.cancer.gov       [employer]  Nicotine Anonymous      Smokefree.gov 7177224325) TRY-NICA 909-679-4377)   8300974316) 44U-QUIT 225 507 8573) www.nicotine-anonymous.org   www.smokefree.gov  Contact your doctor or pharmacist if you have specific questions about starting a smoking cessation program.

## 2013-09-22 NOTE — ED Notes (Signed)
C/o dry nonproductive cough since yesterday. No fever.  Nausea.   Chest soreness.  Denies any other symptoms.

## 2013-09-22 NOTE — ED Provider Notes (Signed)
  Chief Complaint   Chief Complaint  Patient presents with  . Cough    History of Present Illness   Eileen [name] is a 23 year old female who has a history since last night of dry cough, chest pain, nausea, vomiting, and nasal congestion. She denies any fever, chills, headache, purulent nasal drainage, wheezing, chest tightness, sore throat, abdominal pain, or diarrhea. She smokes a half pack of cigarettes a day.  Review of Systems   Other than as noted above, the patient denies any of the following symptoms: Systemic:  No fevers, chills, sweats, or myalgias. Eye:  No redness or discharge. ENT:  No ear pain, headache, nasal congestion, drainage, sinus pressure, or sore throat. Neck:  No neck pain, stiffness, or swollen glands. Lungs:  No cough, sputum production, hemoptysis, wheezing, chest tightness, shortness of breath or chest pain. GI:  No abdominal pain, nausea, vomiting or diarrhea.  [place]   Past medical history, family history, social history, meds, and allergies were reviewed. She is allergic to sulfa. She has an Implanad. She has a history of mitral valve prolapse.  Physical exam   Vital signs:  BP 107/67  Pulse 101  Temp(Src) 98.3 F (36.8 C) (Oral)  Resp 16  SpO2 100%  LMP 09/20/2013  Breastfeeding? No General:  Alert and oriented.  In no distress.  Skin warm and dry. Eye:  No conjunctival injection or drainage. Lids were normal. ENT:  TMs and canals were normal, without erythema or inflammation.  Nasal mucosa was clear and uncongested, without drainage.  Mucous membranes were moist.  Pharynx was clear with no exudate or drainage.  There were no oral ulcerations or lesions. Neck:  Supple, no adenopathy, tenderness or mass. Lungs:  No respiratory distress.  Lungs were clear to auscultation, without wheezes, rales or rhonchi.  Breath sounds were clear and equal bilaterally.  Heart:  Regular rhythm, without gallops, murmers or rubs. Skin:  Clear, warm, and dry,  without rash or lesions.  Assessment     The encounter diagnosis was Viral upper respiratory infection.  No indication for antibiotics.  Plan    1.  Meds:  The following meds were prescribed:   Discharge Medication List as of 09/22/2013  1:27 PM    START taking these medications   Details  albuterol (PROVENTIL HFA;VENTOLIN HFA) 108 (90 BASE) MCG/ACT inhaler Inhale 2 puffs into the lungs 4 (four) times daily., Starting 09/22/2013, Until Discontinued, Normal    traMADol (ULTRAM) 50 MG tablet 1 to 2 tablets every 8 hours as needed for cough, Normal        2.  Patient Education/Counseling:  The patient was given appropriate handouts, self care instructions, and instructed in symptomatic relief.  Instructed to get extra fluids, rest, and use a cool mist vaporizer.    3.  Follow up:  The patient was told to follow up here if no better in 3 to 4 days, or sooner if becoming worse in any way, and given some red flag symptoms such as increasing fever, difficulty breathing, chest pain, or persistent vomiting which would prompt immediate return.  Follow up here as needed.      Harden Mo, MD 09/22/13 2252

## 2013-09-26 ENCOUNTER — Telehealth: Payer: Self-pay | Admitting: *Deleted

## 2013-09-26 DIAGNOSIS — N390 Urinary tract infection, site not specified: Secondary | ICD-10-CM

## 2013-09-26 MED ORDER — CIPROFLOXACIN HCL 500 MG PO TABS: 500.0000 mg | ORAL_TABLET | Freq: Two times a day (BID) | ORAL | Status: DC

## 2013-09-26 NOTE — Telephone Encounter (Signed)
Patient called and is having increased pain with urination and frequency, she has had a urinary tract infection in the past and is in discomfort.  We will call in Cipro for her.  If her symptoms worsen or change she will come in to be seen for an appointment.

## 2013-11-01 ENCOUNTER — Encounter: Payer: Self-pay | Admitting: Obstetrics & Gynecology

## 2013-11-01 ENCOUNTER — Ambulatory Visit (INDEPENDENT_AMBULATORY_CARE_PROVIDER_SITE_OTHER): Payer: 59 | Admitting: Obstetrics & Gynecology

## 2013-11-01 VITALS — BP 114/73 | HR 88 | Ht 59.0 in | Wt 114.0 lb

## 2013-11-01 DIAGNOSIS — F418 Other specified anxiety disorders: Secondary | ICD-10-CM | POA: Insufficient documentation

## 2013-11-01 DIAGNOSIS — F341 Dysthymic disorder: Secondary | ICD-10-CM

## 2013-11-01 MED ORDER — VENLAFAXINE HCL ER 37.5 MG PO CP24: ORAL_CAPSULE | ORAL | Status: DC

## 2013-11-01 NOTE — Progress Notes (Signed)
    Subjective:   Eileen [name] is an 23 y.o. [phone] female who presents for evaluation and treatment of depressive symptoms.  She is 4 months postpartum. Onset approximately 4 months ago, gradually worsening since that time.  Current symptoms include depressed mood, fatigue, anxiety, loss of energy/fatigue, disturbed sleep.  Current treatment for depression:None Sleep problems: Mild   Tearfulness: Absent  Anxiety: Moderate  Panic: Mild  Overall Mood: Moderately worse  Hopelessness: Absent Suicidal/Homicidal ideation: Absent  Family history positive for depression in the patient's unknown.  Previous treatment modalities employed include Prozac, did not like this. Past episodes of depression: After last pregnancy Organic causes of depression present: None.  Review of Systems Pertinent items are noted in HPI.   Objective:   Mental Status Examination: Posture and motor behavior: Appropriate Dress, grooming, personal hygiene: Appropriate Facial expression: Appropriate Speech: Appropriate Mood: Sad Coherency and relevance of thought: Appropriate Thought content: Appropriate Perceptions: Appropriate Orientation:Appropriate Attention and concentration: Appropriate Memory: : Appropriate Vocabulary: Appropriate Abstract reasoning: Appropriate Judgment: Appropriate    Assessment:  Experiencing the following symptoms of depression most of the day nearly every day for more than two consecutive weeks: depressed mood, change in sleep, loss of energy  Depressive Disorder:Yes  Suicide Risk Assessment: Suicidal intent: No Suicidal plan: No Access to means for suicide: None Lethality of means for suicide: None Prior suicide attempts: None Recent exposure to suicide:None     Plan:   Depression with anxiety - Plan: venlafaxine XR (EFFEXOR-XR) 37.5 MG 24 hr capsule  Reviewed concept of depression as biochemical imbalance of neurotransmitters and rationale for treatment. Instructed  patient to contact office or on-call physician promptly should condition worsen or any new symptoms appear and provided on-call telephone numbers.    Patient was also referred to Kinnie Scales, MD (Psychiatrist) for further evaluation and management.  Verita Schneiders, MD, [place] Attending [place], [place]

## 2013-11-01 NOTE — Patient Instructions (Signed)

## 2013-12-24 ENCOUNTER — Other Ambulatory Visit: Payer: Self-pay | Admitting: Obstetrics & Gynecology

## 2013-12-26 ENCOUNTER — Other Ambulatory Visit: Payer: Self-pay | Admitting: Obstetrics & Gynecology

## 2013-12-26 DIAGNOSIS — F418 Other specified anxiety disorders: Secondary | ICD-10-CM

## 2013-12-26 MED ORDER — VENLAFAXINE HCL ER 75 MG PO CP24: ORAL_CAPSULE | ORAL | Status: DC

## 2014-01-09 ENCOUNTER — Encounter: Payer: Self-pay | Admitting: Family Medicine

## 2014-01-09 ENCOUNTER — Ambulatory Visit (INDEPENDENT_AMBULATORY_CARE_PROVIDER_SITE_OTHER): Payer: 59 | Admitting: Family Medicine

## 2014-01-09 VITALS — BP 109/82 | HR 93 | Ht 59.0 in | Wt 110.0 lb

## 2014-01-09 DIAGNOSIS — Z124 Encounter for screening for malignant neoplasm of cervix: Secondary | ICD-10-CM

## 2014-01-09 DIAGNOSIS — N939 Abnormal uterine and vaginal bleeding, unspecified: Secondary | ICD-10-CM

## 2014-01-09 DIAGNOSIS — Z113 Encounter for screening for infections with a predominantly sexual mode of transmission: Secondary | ICD-10-CM

## 2014-01-09 DIAGNOSIS — N898 Other specified noninflammatory disorders of vagina: Secondary | ICD-10-CM

## 2014-01-09 DIAGNOSIS — N926 Irregular menstruation, unspecified: Secondary | ICD-10-CM

## 2014-01-09 MED ORDER — DOXYCYCLINE HYCLATE 100 MG PO CAPS: 100.0000 mg | ORAL_CAPSULE | Freq: Two times a day (BID) | ORAL | Status: DC

## 2014-01-09 NOTE — Progress Notes (Signed)
    Subjective:    [name] is a 23 y.o. female presenting with vaginal irritation  on 01/09/2014  HPI: Bleeding x 3 wks.  Now with vaginal irritation from continuously wearing pads. Has Nexplanon in but had no bleeding x 4 months, and none in previous 4 years with this method of contraception. Has not had a pap smear in since 2009.   I have reviewed and updated PMH, PSH, Meds, FH, SH, OB history, allergies.  Review of Systems  Constitutional: Negative for fever and chills.  Respiratory: Negative for shortness of breath.   Cardiovascular: Negative for chest pain.  Gastrointestinal: Negative for nausea, vomiting and abdominal pain.  Genitourinary: Positive for vaginal bleeding, vaginal discharge and vaginal pain. Negative for dysuria.  Skin: Negative for rash.      Objective:    BP 109/82  Pulse 93  Ht 4\' 11"  (1.499 m)  Wt 110 lb (49.896 kg)  BMI 22.21 kg/m2  Breastfeeding? No Physical Exam  Constitutional: She is oriented to person, place, and time. She appears well-developed and well-nourished. No distress.  HENT:  Head: Normocephalic and atraumatic.  Eyes: No scleral icterus.  Neck: Neck supple.  Cardiovascular: Normal rate.   Pulmonary/Chest: Effort normal.  Abdominal: Soft.  Genitourinary: Vagina normal. No vaginal discharge found.  Some dark, red blood noted  Neurological: She is alert and oriented to person, place, and time.  Skin: Skin is warm and dry.  Psychiatric: She has a normal mood and affect.        Assessment & Plan:   Problem List Items Addressed This Visit   None    Visit Diagnoses   Vaginal discharge    -  Primary    Relevant Orders       Wet prep, genital    Abnormal uterine bleeding        Relevant Medications       doxycycline (VIBRAMYCIN) capsule    Screen for STD (sexually transmitted disease)        Relevant Orders       HIV antibody (with reflex)    Screening for malignant neoplasm of the cervix        Relevant  Orders       Cytology - PAP      Treat presumptively for chronic endometritis.  Await culture results.  Return in about 3 months (around 04/11/2014) for a follow-up.

## 2014-01-09 NOTE — Progress Notes (Signed)
Patient is having vaginal irritation and she is not sure if its because she has been having irregular bleeding and having to wear pads more often or if she has an infection going on.

## 2014-01-09 NOTE — Patient Instructions (Signed)
Abnormal Uterine Bleeding Abnormal uterine bleeding means bleeding from the vagina that is not your normal menstrual period. This can be:  Bleeding or spotting between periods.  Bleeding after sex (sexual intercourse).  Bleeding that is heavier or more than normal.  Periods that last longer than usual.  Bleeding after menopause. There are many problems that may cause this. Treatment will depend on the cause of the bleeding. Any kind of bleeding that is not normal should be reviewed by your doctor.  HOME CARE Watch your condition for any changes. These actions may lessen any discomfort you are having:  Do not use tampons or douches as told by your doctor.  Change your pads often. You should get regular pelvic exams and Pap tests. Keep all appointments for tests as told by your doctor. GET HELP IF:  You are bleeding for more than 1 week.  You feel dizzy at times. GET HELP RIGHT AWAY IF:   You pass out.  You have to change pads every 15 to 30 minutes.  You have belly pain.  You have a fever.  You become sweaty or weak.  You are passing large blood clots from the vagina.  You feel sick to your stomach (nauseous) and throw up (vomit). MAKE SURE YOU:  Understand these instructions.  Will watch your condition.  Will get help right away if you are not doing well or get worse. Document Released: 05/10/2009 Document Revised: 05/03/2013 Document Reviewed: 02/09/2013 [facility] Patient Information 2014 [place], [place].

## 2014-01-10 LAB — WET PREP, GENITAL
Trich, Wet Prep: NONE SEEN
WBC, Wet Prep HPF POC: NONE SEEN
Yeast Wet Prep HPF POC: NONE SEEN

## 2014-01-10 LAB — HIV ANTIBODY (ROUTINE TESTING W REFLEX): HIV 1&2 Ab, 4th Generation: NONREACTIVE

## 2014-01-10 LAB — CYTOLOGY - PAP

## 2014-01-12 ENCOUNTER — Telehealth: Payer: Self-pay | Admitting: *Deleted

## 2014-01-12 DIAGNOSIS — N760 Acute vaginitis: Principal | ICD-10-CM

## 2014-01-12 DIAGNOSIS — B9689 Other specified bacterial agents as the cause of diseases classified elsewhere: Secondary | ICD-10-CM

## 2014-01-12 MED ORDER — METRONIDAZOLE 500 MG PO TABS: 500.0000 mg | ORAL_TABLET | Freq: Two times a day (BID) | ORAL | Status: DC

## 2014-01-12 NOTE — Telephone Encounter (Signed)
I have sent in Flagyl to patients pharmacy.  Pt aware.

## 2014-01-12 NOTE — Telephone Encounter (Signed)
Message copied by Gerri Spore on Fri Jan 12, 2014 11:24 AM ------      Message from: Donnamae Jude      Created: Wed Jan 10, 2014 10:59 AM       In addition to her doxy--call in flagyl 500 mg bid x 7days--no alcohol while taking ------

## 2014-01-16 ENCOUNTER — Encounter: Payer: Self-pay | Admitting: Obstetrics & Gynecology

## 2014-01-16 ENCOUNTER — Ambulatory Visit (INDEPENDENT_AMBULATORY_CARE_PROVIDER_SITE_OTHER): Payer: 59 | Admitting: Obstetrics & Gynecology

## 2014-01-16 VITALS — BP 117/78 | HR 113 | Ht 60.0 in | Wt 111.0 lb

## 2014-01-16 DIAGNOSIS — Z309 Encounter for contraceptive management, unspecified: Secondary | ICD-10-CM

## 2014-01-16 DIAGNOSIS — G8929 Other chronic pain: Secondary | ICD-10-CM

## 2014-01-16 DIAGNOSIS — R102 Pelvic and perineal pain: Principal | ICD-10-CM

## 2014-01-16 DIAGNOSIS — N949 Unspecified condition associated with female genital organs and menstrual cycle: Secondary | ICD-10-CM

## 2014-01-16 DIAGNOSIS — N921 Excessive and frequent menstruation with irregular cycle: Secondary | ICD-10-CM

## 2014-01-16 DIAGNOSIS — Z975 Presence of (intrauterine) contraceptive device: Secondary | ICD-10-CM

## 2014-01-16 MED ORDER — MEDROXYPROGESTERONE ACETATE 10 MG PO TABS: 20.0000 mg | ORAL_TABLET | Freq: Every day | ORAL | Status: DC

## 2014-01-16 MED ORDER — NAPROXEN 500 MG PO TABS: 500.0000 mg | ORAL_TABLET | Freq: Two times a day (BID) | ORAL | Status: DC

## 2014-01-16 NOTE — Patient Instructions (Signed)
Return to clinic for any scheduled appointments or for any gynecologic concerns as needed.   

## 2014-01-16 NOTE — Progress Notes (Signed)
   CLINIC ENCOUNTER NOTE  History:  23 y.o. [id number] here today for evaluation of chronic pelvic pain for a few months, concerned about ovarian cyst. Had ruptured hemorrhagic cyst in the past needing hospitalization.  Mother also has endometriosis, she is concerned about this.    Patient is also having breakthrough bleeding on Nexplanon; current episode has lasted more than 3 weeks. Small amount of bleeding currently.  The following portions of the patient's history were reviewed and updated as appropriate: allergies, current medications, past family history, past medical history, past social history, past surgical history and problem list. Normal pap on 01/09/14.  Review of Systems:  Pertinent items are noted in HPI.  Objective:  Physical Exam BP 117/78  Pulse 113  Ht 5' (1.524 m)  Wt 111 lb (50.349 kg)  BMI 21.68 kg/m2  LMP 12/26/2013 Gen: NAD Abd: Soft, nontender and nondistended Pelvic: Normal appearing external genitalia; normal appearing vaginal mucosa and cervix.  Small amount of bloody discharge.  Small uterus, no other palpable masses, no uterine tenderness.  R adnexal tenderness noted on exam.  Assessment & Plan:  Provera given for breakthrough bleeding; Naproxen for pain Pelvic ultrasound ordered; will follow up results and manage accordingly Return to clinic for any worsening symptoms as needed   Verita Schneiders, MD, [place] Attending [place] for [place], [place]

## 2014-01-16 NOTE — Progress Notes (Signed)
Patient is having irregular bleeding and pain with intercourse.  She does have the nextplanon in for contraception.  The pain has been present since the birth of her last child.  Her periods have been normal 5-7 days up until this last one which has lasted three weeks.  The bleeding is new but the pain has been present.  It hurts all the time but is worse with sex and if she sneezes or coughs its worse as well.  Her mom had to have a full hysterectomy due to endometriosis.  She also has a history of ruptured bleeding cyst that she had to be hospitalized for because the bleeding would not stop.  This was in [place], while her husband and her were there for [employer] reasons.

## 2014-01-22 ENCOUNTER — Emergency Department (HOSPITAL_COMMUNITY): Payer: Medicaid Other

## 2014-01-22 ENCOUNTER — Emergency Department (HOSPITAL_COMMUNITY)
Admission: EM | Admit: 2014-01-22 | Discharge: 2014-01-22 | Disposition: A | Discharge status: Home / Self Care | Payer: Medicaid Other | Attending: Emergency Medicine | Admitting: Emergency Medicine

## 2014-01-22 ENCOUNTER — Encounter (HOSPITAL_COMMUNITY): Payer: Self-pay | Admitting: Emergency Medicine

## 2014-01-22 DIAGNOSIS — Z8679 Personal history of other diseases of the circulatory system: Secondary | ICD-10-CM | POA: Insufficient documentation

## 2014-01-22 DIAGNOSIS — N189 Chronic kidney disease, unspecified: Secondary | ICD-10-CM | POA: Diagnosis not present

## 2014-01-22 DIAGNOSIS — Z9889 Other specified postprocedural states: Secondary | ICD-10-CM | POA: Insufficient documentation

## 2014-01-22 DIAGNOSIS — R1011 Right upper quadrant pain: Secondary | ICD-10-CM | POA: Insufficient documentation

## 2014-01-22 DIAGNOSIS — Z8659 Personal history of other mental and behavioral disorders: Secondary | ICD-10-CM | POA: Diagnosis not present

## 2014-01-22 DIAGNOSIS — Z3202 Encounter for pregnancy test, result negative: Secondary | ICD-10-CM | POA: Insufficient documentation

## 2014-01-22 DIAGNOSIS — Z862 Personal history of diseases of the blood and blood-forming organs and certain disorders involving the immune mechanism: Secondary | ICD-10-CM | POA: Insufficient documentation

## 2014-01-22 DIAGNOSIS — Z791 Long term (current) use of non-steroidal anti-inflammatories (NSAID): Secondary | ICD-10-CM | POA: Insufficient documentation

## 2014-01-22 DIAGNOSIS — F17200 Nicotine dependence, unspecified, uncomplicated: Secondary | ICD-10-CM | POA: Diagnosis not present

## 2014-01-22 DIAGNOSIS — Z79899 Other long term (current) drug therapy: Secondary | ICD-10-CM | POA: Insufficient documentation

## 2014-01-22 LAB — COMPREHENSIVE METABOLIC PANEL
ALT: 16 U/L (ref 0–35)
AST: 19 U/L (ref 0–37)
Albumin: 4.2 g/dL (ref 3.5–5.2)
Alkaline Phosphatase: 72 U/L (ref 39–117)
BUN: 14 mg/dL (ref 6–23)
CHLORIDE: 102 meq/L (ref 96–112)
CO2: 23 meq/L (ref 19–32)
Calcium: 9.8 mg/dL (ref 8.4–10.5)
Creatinine, Ser: 0.75 mg/dL (ref 0.50–1.10)
GFR calc Af Amer: >90 mL/min (ref >90)
Glucose, Bld: 90 mg/dL (ref 70–99)
POTASSIUM: 4.3 meq/L (ref 3.7–5.3)
Sodium: 140 mEq/L (ref 137–147)
Total Protein: 7.7 g/dL (ref 6.0–8.3)

## 2014-01-22 LAB — URINALYSIS, ROUTINE W REFLEX MICROSCOPIC
Bilirubin Urine: NEGATIVE
Glucose, UA: NEGATIVE mg/dL
KETONES UR: NEGATIVE mg/dL
Leukocytes, UA: NEGATIVE
NITRITE: NEGATIVE
Protein, ur: NEGATIVE mg/dL
Specific Gravity, Urine: 1.024 (ref 1.005–1.030)
Urobilinogen, UA: 0.2 mg/dL (ref 0.0–1.0)
pH: 7 (ref 5.0–8.0)

## 2014-01-22 LAB — CBC WITH DIFFERENTIAL/PLATELET
Basophils Absolute: 0 10*3/uL (ref 0.0–0.1)
Basophils Relative: 0 % (ref 0–1)
Eosinophils Absolute: 0.2 10*3/uL (ref 0.0–0.7)
Eosinophils Relative: 3 % (ref 0–5)
HEMATOCRIT: 33.9 % — AB (ref 36.0–46.0)
HEMOGLOBIN: 10.7 g/dL — AB (ref 12.0–15.0)
LYMPHS ABS: 2.7 10*3/uL (ref 0.7–4.0)
LYMPHS PCT: 38 % (ref 12–46)
MCH: 24.2 pg — ABNORMAL LOW (ref 26.0–34.0)
MCHC: 31.6 g/dL (ref 30.0–36.0)
MCV: 76.5 fL — ABNORMAL LOW (ref 78.0–100.0)
MONO ABS: 0.5 10*3/uL (ref 0.1–1.0)
MONOS PCT: 7 % (ref 3–12)
NEUTROS PCT: 52 % (ref 43–77)
Neutro Abs: 3.8 10*3/uL (ref 1.7–7.7)
Platelets: 206 10*3/uL (ref 150–400)
RBC: 4.43 MIL/uL (ref 3.87–5.11)
RDW: 14.6 % (ref 11.5–15.5)
WBC: 7.3 10*3/uL (ref 4.0–10.5)

## 2014-01-22 LAB — URINE MICROSCOPIC-ADD ON

## 2014-01-22 LAB — LIPASE, BLOOD: LIPASE: 40 U/L (ref 11–59)

## 2014-01-22 LAB — POC URINE PREG, ED: Preg Test, Ur: NEGATIVE

## 2014-01-22 MED ORDER — ONDANSETRON HCL 4 MG/2ML IJ SOLN
4.0000 mg | Freq: Once | INTRAMUSCULAR | Status: AC
  Administered 2014-01-22: 4 mg via INTRAVENOUS
  Filled 2014-01-22: qty 2

## 2014-01-22 MED ORDER — ONDANSETRON HCL 4 MG PO TABS: 4.0000 mg | ORAL_TABLET | Freq: Four times a day (QID) | ORAL | Status: DC

## 2014-01-22 MED ORDER — MORPHINE SULFATE 4 MG/ML IJ SOLN
4.0000 mg | Freq: Once | INTRAMUSCULAR | Status: AC
  Administered 2014-01-22: 4 mg via INTRAVENOUS
  Filled 2014-01-22: qty 1

## 2014-01-22 MED ORDER — SODIUM CHLORIDE 0.9 % IV BOLUS (SEPSIS)
1000.0000 mL | Freq: Once | INTRAVENOUS | Status: AC
  Administered 2014-01-22: 1000 mL via INTRAVENOUS

## 2014-01-22 MED ORDER — HYDROCODONE-ACETAMINOPHEN 5-325 MG PO TABS: 2.0000 | ORAL_TABLET | ORAL | Status: DC | PRN

## 2014-01-22 MED ORDER — OMEPRAZOLE 20 MG PO CPDR: 20.0000 mg | DELAYED_RELEASE_CAPSULE | Freq: Every day | ORAL | Status: DC

## 2014-01-22 NOTE — Discharge Instructions (Signed)
Your symptoms today may be related to your gallbladder not emptying normally since you had similar symptoms with your prior pregnancy. I recommended you followup with a primary care physician who may refer you to a gastroenterologist for special testing including endoscopy or HIDA scan.  Your labs today were normal including your liver tests, pancreas and kidneys. Your blood counts were also normal. Urine shows no sign of infection and you are not pregnant. Her ultrasound showed no gallstones or signs of infection of your gallbladder. It is also possibility that she may have inflammation of your stomach wall Gastritis, acid reflux or an ulcer. We are sending you home on a medicine called omeprazole which may help with these. If this does not improve, I gastroenterologist may perform an endoscopy.   Abdominal Pain, Women Abdominal (stomach, pelvic, or belly) pain can be caused by many things. It is important to tell your doctor:  The location of the pain.  Does it come and go or is it present all the time?  Are there things that start the pain (eating certain foods, exercise)?  Are there other symptoms associated with the pain (fever, nausea, vomiting, diarrhea)? All of this is helpful to know when trying to find the cause of the pain. CAUSES   Stomach: virus or bacteria infection, or ulcer.  Intestine: appendicitis (inflamed appendix), regional ileitis (Crohn's disease), ulcerative colitis (inflamed colon), irritable bowel syndrome, diverticulitis (inflamed diverticulum of the colon), or cancer of the stomach or intestine.  Gallbladder disease or stones in the gallbladder.  Kidney disease, kidney stones, or infection.  Pancreas infection or cancer.  Fibromyalgia (pain disorder).  Diseases of the female organs:  Uterus: fibroid (non-cancerous) tumors or infection.  Fallopian tubes: infection or tubal pregnancy.  Ovary: cysts or tumors.  Pelvic adhesions (scar  tissue).  Endometriosis (uterus lining tissue growing in the pelvis and on the pelvic organs).  Pelvic congestion syndrome (female organs filling up with blood just before the menstrual period).  Pain with the menstrual period.  Pain with ovulation (producing an egg).  Pain with an IUD (intrauterine device, birth control) in the uterus.  Cancer of the female organs.  Functional pain (pain not caused by a disease, may improve without treatment).  Psychological pain.  Depression. DIAGNOSIS  Your doctor will decide the seriousness of your pain by doing an examination.  Blood tests.  X-rays.  Ultrasound.  CT scan (computed tomography, special type of X-ray).  MRI (magnetic resonance imaging).  Cultures, for infection.  Barium enema (dye inserted in the large intestine, to better view it with X-rays).  Colonoscopy (looking in intestine with a lighted tube).  Laparoscopy (minor surgery, looking in abdomen with a lighted tube).  Major abdominal exploratory surgery (looking in abdomen with a large incision). TREATMENT  The treatment will depend on the cause of the pain.   Many cases can be observed and treated at home.  Over-the-counter medicines recommended by your caregiver.  Prescription medicine.  Antibiotics, for infection.  Birth control pills, for painful periods or for ovulation pain.  Hormone treatment, for endometriosis.  Nerve blocking injections.  Physical therapy.  Antidepressants.  Counseling with a psychologist or psychiatrist.  Minor or major surgery. HOME CARE INSTRUCTIONS   Do not take laxatives, unless directed by your caregiver.  Take over-the-counter pain medicine only if ordered by your caregiver. Do not take aspirin because it can cause an upset stomach or bleeding.  Try a clear liquid diet (broth or water) as ordered by your  caregiver. Slowly move to a bland diet, as tolerated, if the pain is related to the stomach or  intestine.  Have a thermometer and take your temperature several times a day, and record it.  Bed rest and sleep, if it helps the pain.  Avoid sexual intercourse, if it causes pain.  Avoid stressful situations.  Keep your follow-up appointments and tests, as your caregiver orders.  If the pain does not go away with medicine or surgery, you may try:  Acupuncture.  Relaxation exercises (yoga, meditation).  Group therapy.  Counseling. SEEK MEDICAL CARE IF:   You notice certain foods cause stomach pain.  Your home care treatment is not helping your pain.  You need stronger pain medicine.  You want your IUD removed.  You feel faint or lightheaded.  You develop nausea and vomiting.  You develop a rash.  You are having side effects or an allergy to your medicine. SEEK IMMEDIATE MEDICAL CARE IF:   Your pain does not go away or gets worse.  You have a fever.  Your pain is felt only in portions of the abdomen. The right side could possibly be appendicitis. The left lower portion of the abdomen could be colitis or diverticulitis.  You are passing blood in your stools (bright red or black tarry stools, with or without vomiting).  You have blood in your urine.  You develop chills, with or without a fever.  You pass out. MAKE SURE YOU:   Understand these instructions.  Will watch your condition.  Will get help right away if you are not doing well or get worse. Document Released: 05/10/2007 Document Revised: 10/05/2011 Document Reviewed: 05/30/2009 [facility] Patient Information 2015 [place], [place]. This information is not intended to replace advice given to you by your health care provider. Make sure you discuss any questions you have with your health care provider.    Emergency Department Resource Guide 1) Find a Doctor and Pay Out of Pocket Although you won't have to find out who is covered by your insurance plan, it is a good idea to ask around and get  recommendations. You will then need to call the office and see if the doctor you have chosen will accept you as a new patient and what types of options they offer for patients who are self-pay. Some doctors offer discounts or will set up payment plans for their patients who do not have insurance, but you will need to ask so you aren't surprised when you get to your appointment.  2) Contact Your Local Health Department Not all health departments have doctors that can see patients for sick visits, but many do, so it is worth a call to see if yours does. If you don't know where your local health department is, you can check in your phone book. The CDC also has a tool to help you locate your state's health department, and many state websites also have listings of all of their local health departments.  3) Find a [facility] If your illness is not likely to be very severe or complicated, you may want to try a walk in clinic. These are popping up all over the country in pharmacies, drugstores, and shopping centers. They're usually staffed by nurse practitioners or physician assistants that have been trained to treat common illnesses and complaints. They're usually fairly quick and inexpensive. However, if you have serious medical issues or chronic medical problems, these are probably not your best option.  No Primary Care Doctor: -  Call Health Connect at  [phone] - they can help you locate a primary care doctor that  accepts your insurance, provides certain services, etc. - Physician Referral Service- [phone]  Chronic Pain Problems: Organization         Address  Phone   Notes  [facility]  [phone] Patients need to be referred by their primary care doctor.   Medication Assistance: Organization         Address  Phone   Notes  [facility] Medication [facility] [address] [place], [place] [address] [phone] --Must be a resident of  [facility] -- Must have NO insurance coverage whatsoever (no Medicaid/ Medicare, etc.) -- The pt. MUST have a primary care doctor that directs their care regularly and follows them in the community   MedAssist  [phone]   [employer]  [phone]    Agencies that provide inexpensive medical care: Organization         Address  Phone   Notes  [place]  [phone]   [facility]    [phone]   [facility] [place], [place] [address] [phone]   [place] [address] [place] [phone]   Planned Parenthood    [phone]   [facility]    [phone]   [place] and [place] [address], [place] Phone:  [phone], Fax:  [phone] Hours of Operation:  9 am - 6 pm, M-F.  Also accepts Medicaid/Medicare and self-pay.  [facility] for [place], [address], [place] Phone: [phone], Fax: [phone]. Hours of Operation:  8:30 am - 5:30 pm, M-F.  Also accepts Medicaid and self-pay.  [facility] [address] [place] Phone: [phone]   [place], [place], [place] [phone], Ext. [phone] Mondays & Thursdays: 7-9 AM.  First 15 patients are seen on a first come, first serve basis.    [place] Providers:  Organization         Address  Phone   Notes  [facility] [address], [place] [phone] Also accepts self-pay patients.  [facility] [address], [place]  [phone]   [place], [place] [phone]   [facility] [address] [place] [phone]   Lucianne Lei [address], [place]   [phone] Only accepts [place] Access  [place] patients after they have their name applied to their card.   Self-Pay (no insurance) in [facility]:  Organization         Address  Phone   Notes  Sickle Cell Patients, [facility] [place] [phone]   [facility] [place] [phone]   [facility] [place]  [place], [place], [place] [phone]   [facility]/Dr. Osei-Bonsu  [address] [place] or [address], [place] [phone] Phone number for both [place] and [place] locations is the same.  Urgent Medical and [facility] [address], [place] [phone]   [place]  [address] [place] or [address] [phone]   [facility] [place] [phone], phone; [phone], fax Sees patients 1st and 3rd Saturday of every month.  Must not qualify for public or private insurance (i.e. Medicaid, Medicare, [place] Health Choice, Veterans' Benefits)  Household income should be no more than 200% of the poverty level The clinic cannot treat you if you are pregnant or think you are pregnant  Sexually transmitted diseases are not treated at the clinic.    Dental Care: Organization         Address  Phone  Notes  [facility] of [facility] [place] [phone] Accepts children up to age 84 who are enrolled in [place] or [place]; pregnant women with a Medicaid card; and children who have applied for Medicaid or [place] Health Choice, but were declined, whose parents can pay a reduced fee at time of service.  [facility] Department of [facility]  [address], [place] [phone] Accepts children up to age 37 who are enrolled in [place] or [place]; pregnant women with a Medicaid  card; and children who have applied for Medicaid or [place] Health Choice, but were declined, whose parents can pay a reduced fee at time of service.  [place] Adult [facility] Access PROGRAM  [place] [phone] Patients are seen by appointment only. Walk-ins are not accepted. [place] will see patients 75 years of age and older. Monday - Tuesday (8am-5pm) Most Wednesdays (8:30-5pm) $30 per visit, cash only  [facility] Adult Dental Access PROGRAM  [address], [facility] [phone] Patients are seen by appointment only. Walk-ins are not accepted. [place] will see patients 63 years of age and older. One Wednesday Evening (Monthly: Volunteer Based).  $30 per visit, cash only  [place]  [phone] for adults; Children under age 54, call Graduate Pediatric Dentistry at [phone]. Children aged 36-14, please call [phone] to request a pediatric application.  Dental services are provided in all areas of dental care including fillings, crowns and bridges, complete and partial dentures, implants, gum treatment, root canals, and extractions. Preventive care is also provided. Treatment is provided to both adults and children. Patients are selected via a lottery and there is often a waiting list.   [facility] [address], [place]  [phone] [email or web address]   [facility] [address] [place], [place] [phone], Ext. [phone] Second and Fourth Thursday of each month, opens at 6:30 AM; Clinic ends at 9 AM.  Patients are seen on a first-come first-served basis, and a limited number are seen during each clinic.   [facility]  [address] Hillard Danker [place], [place] [phone]   Eligibility Requirements You must have lived in [place], [place], or [place] counties for at least the last three months.   You cannot be eligible for state or federal sponsored [employer],  including [employer], [place], or [employer].   You generally cannot be eligible for healthcare insurance through your employer.    How to apply: Eligibility screenings are held every Tuesday and Wednesday afternoon from 1:00 pm until 4:00 pm. You do not need an appointment for the interview!  [facility] [address] [place], [place]   [facility]  Department  [place] Department  [place]  [phone]    Behavioral Health Resources in the Community: Intensive Outpatient Programs Organization         Address  Phone  Notes  [place]. [address] [place], [place] [phone]   [facility]patient [address] [place], [place]   ADS: Alcohol & Drug S[facility] [address] [place], [place]   [place] [address],  [place], [place] or [phone]   Substance Abuse Resources Organization         Address  Phone  Notes  Alcohol and Drug Services  [phone]   [place]  [phone]   [facility] [place]   Chinita Pester  [phone]   Residential & Outpatient Substance Abuse Program  [phone]   Psychological Services Organization         Address  Phone  Notes  [facility] [place]  [phone]   [place] [address], [place] or [phone]    Mobile Crisis Teams Organization         Address  Phone  Notes  [facility], Mobile Crisis Care Unit  [phone]   [facility]  [address] [place], [place]   [place] Levels [address] [place] [phone]    Self-Help/Support Groups Organization         Address  Phone             Notes  [place].  of [place] - variety of support groups  [place] Call for more information  Narcotics Anonymous (NA), [facility] [address], [employer] [place]  2 meetings at this location   Special educational needs teacher         Address  Phone  Notes  [facility] [place],    [place]  [phone]   [facility]  [address], [place] [address], [place], [place]   [place], [place] [phone] Admissions: 8am-3pm M-F  Incentives Substance [place] [address]    [place], [place] [phone]   [facility] [address] [place], [place], [place]   [facility] [address],  [place], [place]   [facility] Programs - Intensive Outpatient [address]een [address], [place], [place]   [facility] ([place].,  [place], [place] [phone] or [phone]   [facility] (RTS) [address] [place], [place] Accepts Medicaid  [facility] [place] [address],  [place] [phone] Substance Abuse/Addiction Treatment   [facility] Organization         Address  Phone  Notes  [facility]  [phone]   Domenic Schwab, PhD [address] [place]lis [place], [place]   [phone] or [phone]   [place], [place] [phone]   [place] [address], [place], [place] [phone] Insurance/Medicaid/sponsorship through [employer] and Families [address], [place] [address]  [place], [place] [phone] [place], [place] [phone]    Dr. Adele Schilder  [phone]   [facility] Dept. 1) [address] [place] 2)  [place] 3)  [place] [address], [place] [phone]  [phone]   [place] [phone] or [phone] (After Hours)

## 2014-01-22 NOTE — ED Provider Notes (Signed)
TIME SEEN: 7:06 PM  CHIEF COMPLAINT: Abdominal pain, nausea  HPI: Patient is a 23 y.o. F with history of hypothyroidism, anxiety who presents to the emergency department right upper quadrant pain has been going on for several days, worse today. She states is worse after eating. No alleviating factors. She did have one episode of nonbloody, nonbilious vomiting yesterday. No fevers, chills, diarrhea, dysuria or hematuria, vaginal discharge. She states she is currently on her menstrual cycle and has been having vaginal bleeding for the past 4 weeks. She has an appointment to see her OB/GYN tomorrow and have a transvaginal ultrasound. She has tried Provera without relief. She states her vaginal bleeding is not the reason why she came to the emergency department. No bloody stool or melena. No heavy NSAID or alcohol use.  ROS: See HPI Constitutional: no fever  Eyes: no drainage  ENT: no runny nose   Cardiovascular:  no chest pain  Resp: no SOB  GI:  vomiting GU: no dysuria Integumentary: no rash  Allergy: no hives  Musculoskeletal: no leg swelling  Neurological: no slurred speech ROS otherwise negative  PAST MEDICAL HISTORY/PAST SURGICAL HISTORY:  Past Medical History  Diagnosis Date  . Dysuria   . Headache(784.0)   . Allergy   . Anemia   . Anxiety   . Hypothyroid     history of.  . Chronic kidney disease     kidney infection.  . Abortion 08/04/2011  . Mitral valve prolapse     MEDICATIONS:  Prior to Admission medications   Medication Sig Start Date End Date Taking? Authorizing Provider  etonogestrel (NEXPLANON) 68 MG IMPL implant Inject 1 each into the skin once.   Yes Historical Provider, MD  medroxyPROGESTERone (PROVERA) 10 MG tablet Take 2 tablets (20 mg total) by mouth daily. For 15 days 01/16/14  Yes Osborne Oman, MD  naproxen (NAPROSYN) 500 MG tablet Take 1 tablet (500 mg total) by mouth 2 (two) times daily with a meal. 01/16/14  Yes Osborne Oman, MD    ALLERGIES:   Allergies  Allergen Reactions  . Other Hives and Nausea And Vomiting    Green beans  . Red Dye Nausea And Vomiting  . Sulfa Antibiotics Hives and Swelling    Childhood reaction    SOCIAL HISTORY:  History  Substance Use Topics  . Smoking status: Current Every Day Smoker -- 0.50 packs/day  . Smokeless tobacco: Never Used  . Alcohol Use: Yes     Comment: occasional but not while pregnant    FAMILY HISTORY: Family History  Problem Relation Age of Onset  . Cancer Mother     SKIN  . Asthma Brother   . Diabetes Maternal Grandmother   . Emphysema Paternal Grandfather     EXAM: BP 106/58  Pulse 91  Temp(Src) 98.5 F (36.9 C) (Oral)  Resp 16  Ht 4\' 11"  (1.499 m)  Wt 113 lb (51.256 kg)  BMI 22.81 kg/m2  SpO2 98%  LMP 12/26/2013 CONSTITUTIONAL: Alert and oriented and responds appropriately to questions. Well-appearing; well-nourished, nontoxic, in no distress HEAD: Normocephalic EYES: Conjunctivae clear, PERRL ENT: normal nose; no rhinorrhea; moist mucous membranes; pharynx without lesions noted NECK: Supple, no meningismus, no LAD  CARD: RRR; S1 and S2 appreciated; no murmurs, no clicks, no rubs, no gallops RESP: Normal chest excursion without splinting or tachypnea; breath sounds clear and equal bilaterally; no wheezes, no rhonchi, no rales,  ABD/GI: Normal bowel sounds; non-distended; soft, tender to palpation the right upper quadrant  with a positive Murphy sign, no guarding or rebound, no peritoneal signs BACK:  The back appears normal and is non-tender to palpation, there is no CVA tenderness EXT: Normal ROM in all joints; non-tender to palpation; no edema; normal capillary refill; no cyanosis    SKIN: Normal color for age and race; warm NEURO: Moves all extremities equally PSYCH: The patient's mood and manner are appropriate. Grooming and personal hygiene are appropriate.  MEDICAL DECISION MAKING: Patient here with right upper quadrant abdominal pain. Differential  diagnosis includes biliary colic, cholecystitis, pancreatitis, ulcer, gastritis. We'll obtain abdominal labs, urine and ultrasound of her gallbladder. We'll give pain and nausea medicine. We'll keep her n.p.o. at this time.  ED PROGRESS: Patient's labs are unremarkable. No leukocytosis, elevation of her LFTs and lipase. She does have hemoglobin in her urine but is currently menstruating. Pregnancy test negative. Her right upper quadrant ultrasound is normal, no signs of cholecystitis or cholelithiasis. She still well appearing, nontoxic and tolerating by mouth. She states that she did have cholestasis with her prior pregnancy. Have discussed with patient that she may need further testing as an outpatient including possible endoscopy or HIDA scan. Have recommended she followup with a PCP for referral to gastroenterology and general surgery as needed. Will get outpatient resource guide. We'll discharge home with a small amount of pain medication and nausea medicine. Discussed strict return precautions and supportive care instructions. She verbalizes understanding and is comfortable with plan.     [place], DO 01/22/14 2224

## 2014-01-22 NOTE — ED Notes (Signed)
Pt transported to US

## 2014-01-22 NOTE — ED Notes (Signed)
Pt in c/o upper abd pain that is worse after eating, also intermittent n/v, states the pain radiates into right shoulder, symptoms over the last three days, denies fever at home, no distress noted

## 2014-01-23 ENCOUNTER — Ambulatory Visit (HOSPITAL_COMMUNITY)
Admission: RE | Admit: 2014-01-23 | Discharge: 2014-01-23 | Disposition: A | Discharge status: Home / Self Care | Payer: 59 | Source: Ambulatory Visit | Attending: Obstetrics & Gynecology | Admitting: Obstetrics & Gynecology

## 2014-01-23 DIAGNOSIS — N854 Malposition of uterus: Secondary | ICD-10-CM | POA: Insufficient documentation

## 2014-01-23 DIAGNOSIS — R102 Pelvic and perineal pain: Secondary | ICD-10-CM

## 2014-01-23 DIAGNOSIS — N926 Irregular menstruation, unspecified: Secondary | ICD-10-CM | POA: Insufficient documentation

## 2014-01-23 DIAGNOSIS — G8929 Other chronic pain: Secondary | ICD-10-CM

## 2014-01-23 DIAGNOSIS — N83209 Unspecified ovarian cyst, unspecified side: Secondary | ICD-10-CM | POA: Insufficient documentation

## 2014-01-23 DIAGNOSIS — N949 Unspecified condition associated with female genital organs and menstrual cycle: Secondary | ICD-10-CM | POA: Insufficient documentation

## 2014-01-24 ENCOUNTER — Telehealth: Payer: Self-pay | Admitting: *Deleted

## 2014-01-24 NOTE — Telephone Encounter (Signed)
Patient called for ultrasound results.  She was notified that her results are normal.  She continues to bleed and it has picked up some over the last couple of days.  It is time for her regular cycle to start so we are thinking maybe this is what is happening.  She will pay attention to it and call me back on Monday to let me know if it increases or changes.

## 2014-03-04 ENCOUNTER — Emergency Department (INDEPENDENT_AMBULATORY_CARE_PROVIDER_SITE_OTHER)
Admission: EM | Admit: 2014-03-04 | Discharge: 2014-03-04 | Disposition: A | Discharge status: Home / Self Care | Payer: Medicaid Other | Source: Home / Self Care | Attending: Emergency Medicine | Admitting: Emergency Medicine

## 2014-03-04 ENCOUNTER — Encounter (HOSPITAL_COMMUNITY): Payer: Self-pay | Admitting: Emergency Medicine

## 2014-03-04 DIAGNOSIS — J069 Acute upper respiratory infection, unspecified: Secondary | ICD-10-CM

## 2014-03-04 LAB — POCT RAPID STREP A: STREPTOCOCCUS, GROUP A SCREEN (DIRECT): NEGATIVE

## 2014-03-04 MED ORDER — IPRATROPIUM BROMIDE 0.06 % NA SOLN: 2.0000 | Freq: Four times a day (QID) | NASAL | Status: DC

## 2014-03-04 MED ORDER — PREDNISONE 20 MG PO TABS: 20.0000 mg | ORAL_TABLET | Freq: Two times a day (BID) | ORAL | Status: DC

## 2014-03-04 MED ORDER — TRAMADOL HCL 50 MG PO TABS: ORAL_TABLET | ORAL | Status: DC

## 2014-03-04 NOTE — ED Provider Notes (Signed)
Chief Complaint   Chief Complaint  Patient presents with  . Cough    History of Present Illness   Eileen [name] is a-year-old female has had a two-day history of sore throat, cough which is sometimes dry and sometimes productive yellow-green sputum, chest tightness, chest pain, chills, nasal congestion, rhinorrhea, headache, and nausea. She denies any fever or or vomiting. No sick exposures.  Review of Systems   Other than as noted above, the patient denies any of the following symptoms: Systemic:  No fevers, chills, sweats, or myalgias. Eye:  No redness or discharge. ENT:  No ear pain, headache, nasal congestion, drainage, sinus pressure, or sore throat. Neck:  No neck pain, stiffness, or swollen glands. Lungs:  No cough, sputum production, hemoptysis, wheezing, chest tightness, shortness of breath or chest pain. GI:  No abdominal pain, nausea, vomiting or diarrhea.  [place]   Past medical history, family history, social history, meds, and allergies were reviewed. She's allergic to sulfa. Her only medication is birth control pills. She has a history of mitral valve prolapse.  Physical exam   Vital signs:  BP 107/72  Pulse 100  Temp(Src) 98.5 F (36.9 C) (Oral)  SpO2 98%  LMP 02/18/2014 General:  Alert and oriented.  In no distress.  Skin warm and dry. Eye:  No conjunctival injection or drainage. Lids were normal. ENT:  TMs and canals were normal, without erythema or inflammation.  Nasal mucosa was clear and uncongested, without drainage.  Mucous membranes were moist.  Pharynx was erythematous with a white spot on her left anterior tonsillar pillar.  There were no oral ulcerations or lesions. Neck:  Supple, no adenopathy, tenderness or mass. Lungs:  No respiratory distress.  Lungs were clear to auscultation, without wheezes, rales or rhonchi.  Breath sounds were clear and equal bilaterally.  Heart:  Regular rhythm, without gallops, murmers or rubs. Skin:  Clear, warm, and dry,  without rash or lesions.  Labs   Results for orders placed during the hospital encounter of 03/04/14  POCT RAPID STREP A ([facility] ONLY)      Result Value Ref Range   Streptococcus, Group A Screen (Direct) NEGATIVE  NEGATIVE    Assessment     The encounter diagnosis was Viral URI.  No evidence of pneumonia and no indication for antibiotics.  Plan    1.  Meds:  The following meds were prescribed:   Discharge Medication List as of 03/04/2014  4:21 PM    START taking these medications   Details  ipratropium (ATROVENT) 0.06 % nasal spray Place 2 sprays into both nostrils 4 (four) times daily., Starting 03/04/2014, Until Discontinued, Normal    predniSONE (DELTASONE) 20 MG tablet Take 1 tablet (20 mg total) by mouth 2 (two) times daily., Starting 03/04/2014, Until Discontinued, Normal    traMADol (ULTRAM) 50 MG tablet 1 to 2 tablets every 8 hours as needed for cough, Print        2.  Patient Education/Counseling:  The patient was given appropriate handouts, self care instructions, and instructed in symptomatic relief.  Instructed to get extra fluids, rest, and use a cool mist vaporizer.    3.  Follow up:  The patient was told to follow up here if no better in 3 to 4 days, or sooner if becoming worse in any way, and given some red flag symptoms such as increasing fever, difficulty breathing, chest pain, or persistent vomiting which would prompt immediate return.  Follow up here as needed.  Harden Mo, MD 03/04/14 [phone]

## 2014-03-04 NOTE — Discharge Instructions (Signed)

## 2014-03-04 NOTE — ED Notes (Signed)
C/o cough for two days  States throat is itchy States coughing makes chest hurt States she has a stuffy nose and a little sneezing  mucinex was used as tx

## 2014-03-06 LAB — CULTURE, GROUP A STREP

## 2014-03-11 ENCOUNTER — Emergency Department (HOSPITAL_COMMUNITY)
Admission: EM | Admit: 2014-03-11 | Discharge: 2014-03-11 | Disposition: A | Discharge status: Home / Self Care | Payer: Medicaid Other | Attending: Emergency Medicine | Admitting: Emergency Medicine

## 2014-03-11 ENCOUNTER — Encounter (HOSPITAL_COMMUNITY): Payer: Self-pay | Admitting: Emergency Medicine

## 2014-03-11 DIAGNOSIS — Z862 Personal history of diseases of the blood and blood-forming organs and certain disorders involving the immune mechanism: Secondary | ICD-10-CM | POA: Insufficient documentation

## 2014-03-11 DIAGNOSIS — L539 Erythematous condition, unspecified: Secondary | ICD-10-CM | POA: Insufficient documentation

## 2014-03-11 DIAGNOSIS — N189 Chronic kidney disease, unspecified: Secondary | ICD-10-CM | POA: Insufficient documentation

## 2014-03-11 DIAGNOSIS — R05 Cough: Secondary | ICD-10-CM | POA: Diagnosis present

## 2014-03-11 DIAGNOSIS — Z8659 Personal history of other mental and behavioral disorders: Secondary | ICD-10-CM | POA: Diagnosis not present

## 2014-03-11 DIAGNOSIS — Z79899 Other long term (current) drug therapy: Secondary | ICD-10-CM | POA: Diagnosis not present

## 2014-03-11 DIAGNOSIS — Z792 Long term (current) use of antibiotics: Secondary | ICD-10-CM | POA: Diagnosis not present

## 2014-03-11 DIAGNOSIS — R059 Cough, unspecified: Secondary | ICD-10-CM | POA: Diagnosis present

## 2014-03-11 DIAGNOSIS — Z8639 Personal history of other endocrine, nutritional and metabolic disease: Secondary | ICD-10-CM | POA: Insufficient documentation

## 2014-03-11 DIAGNOSIS — J209 Acute bronchitis, unspecified: Secondary | ICD-10-CM | POA: Diagnosis not present

## 2014-03-11 DIAGNOSIS — IMO0002 Reserved for concepts with insufficient information to code with codable children: Secondary | ICD-10-CM | POA: Diagnosis not present

## 2014-03-11 DIAGNOSIS — L03312 Cellulitis of back [any part except buttock]: Secondary | ICD-10-CM

## 2014-03-11 DIAGNOSIS — L03319 Cellulitis of trunk, unspecified: Secondary | ICD-10-CM

## 2014-03-11 DIAGNOSIS — L02219 Cutaneous abscess of trunk, unspecified: Secondary | ICD-10-CM | POA: Insufficient documentation

## 2014-03-11 DIAGNOSIS — F17200 Nicotine dependence, unspecified, uncomplicated: Secondary | ICD-10-CM | POA: Insufficient documentation

## 2014-03-11 DIAGNOSIS — Z8679 Personal history of other diseases of the circulatory system: Secondary | ICD-10-CM | POA: Diagnosis not present

## 2014-03-11 MED ORDER — HYDROCODONE-ACETAMINOPHEN 5-325 MG PO TABS: 1.0000 | ORAL_TABLET | Freq: Four times a day (QID) | ORAL | Status: DC | PRN

## 2014-03-11 MED ORDER — IPRATROPIUM-ALBUTEROL 0.5-2.5 (3) MG/3ML IN SOLN
3.0000 mL | Freq: Once | RESPIRATORY_TRACT | Status: AC
  Administered 2014-03-11: 3 mL via RESPIRATORY_TRACT
  Filled 2014-03-11: qty 3

## 2014-03-11 MED ORDER — HYDROCODONE-ACETAMINOPHEN 5-325 MG PO TABS
1.0000 | ORAL_TABLET | Freq: Once | ORAL | Status: AC
  Administered 2014-03-11: 1 via ORAL
  Filled 2014-03-11: qty 1

## 2014-03-11 MED ORDER — AEROCHAMBER PLUS FLO-VU MEDIUM MISC
1.0000 | Freq: Once | Status: AC
  Administered 2014-03-11: 1
  Filled 2014-03-11: qty 1

## 2014-03-11 MED ORDER — ALBUTEROL SULFATE HFA 108 (90 BASE) MCG/ACT IN AERS
2.0000 | INHALATION_SPRAY | RESPIRATORY_TRACT | Status: DC | PRN
  Filled 2014-03-11: qty 6.7

## 2014-03-11 MED ORDER — CEPHALEXIN 250 MG PO CAPS
500.0000 mg | ORAL_CAPSULE | Freq: Once | ORAL | Status: AC
  Administered 2014-03-11: 500 mg via ORAL
  Filled 2014-03-11: qty 2

## 2014-03-11 MED ORDER — CEPHALEXIN 250 MG PO CAPS: 250.0000 mg | ORAL_CAPSULE | Freq: Four times a day (QID) | ORAL | Status: DC

## 2014-03-11 NOTE — ED Provider Notes (Signed)
CSN: [id number]     Arrival date & time 03/11/14  0102 History   First MD Initiated Contact with Patient 03/11/14 [phone]     Chief Complaint  Patient presents with  . Cough     (Consider location/radiation/quality/duration/timing/severity/associated sxs/prior Treatment) HPI Comments: Patient presents with one-week history of nonproductive cough and wheezing.  She was seen at urgent care.  Several days ago for URI and Atrovent nasal inhaler inhalation, as well as oral steroids, and Ultram for her pain.  She states, that she is still coughing, and wheezing.  The nasal congestion, has decreased.  The Ultram is not controlling her pain. She is describing pain to me from a fall approximately one week ago, when she fell down 6 steps, striking her back on the stair tread.  She has a small area over her mid T-spine is a deep abrasion with surrounding cellulitis  Patient is a 23 y.o. Eileen [name] presenting with cough. The history is provided by the patient.  Cough Cough characteristics:  Non-productive Severity:  Moderate Timing:  Intermittent Progression:  Unchanged Chronicity:  New Smoker: yes   Relieved by:  Nothing Worsened by:  Activity Ineffective treatments: oral steroids. Associated symptoms: wheezing   Associated symptoms: no fever     Past Medical History  Diagnosis Date  . Dysuria   . Headache(784.0)   . Allergy   . Anemia   . Anxiety   . Hypothyroid     history of.  . Chronic kidney disease     kidney infection.  . Abortion 08/04/2011  . Mitral valve prolapse    Past Surgical History  Procedure Laterality Date  . Dilation and curettage of uterus      abortion   Family History  Problem Relation Age of Onset  . Cancer Mother     SKIN  . Asthma Brother   . Diabetes Maternal Grandmother   . Emphysema Paternal Grandfather    History  Substance Use Topics  . Smoking status: Current Every Day Smoker -- 0.50 packs/day  . Smokeless tobacco: Never Used  . Alcohol Use: Yes       Comment: occasional but not while pregnant   OB History   Grav Para Term Preterm Abortions TAB SAB Ect Mult Living   5 3 3  0 2 2  0 0 3     Review of Systems  Constitutional: Negative for fever.  Respiratory: Positive for cough and wheezing.   Skin: Positive for wound.  All other systems reviewed and are negative.     Allergies  Other; Red dye; and Sulfa antibiotics  Home Medications   Prior to Admission medications   Medication Sig Start Date End Date Taking? Authorizing Provider  etonogestrel (NEXPLANON) 68 MG IMPL implant Inject 1 each into the skin once. 2014   Yes Historical Provider, MD  ipratropium (ATROVENT) 0.06 % nasal spray Place 2 sprays into both nostrils 4 (four) times daily.   Yes Historical Provider, MD  omeprazole (PRILOSEC) 20 MG capsule Take 20 mg by mouth daily.   Yes Historical Provider, MD  predniSONE (DELTASONE) 20 MG tablet Take 20 mg by mouth 2 (two) times daily.   Yes Historical Provider, MD  traMADol (ULTRAM) 50 MG tablet Take 50-100 mg by mouth every 8 (eight) hours as needed (cough).   Yes Historical Provider, MD  cephALEXin (KEFLEX) 250 MG capsule Take 1 capsule (250 mg total) by mouth 4 (four) times daily. 03/11/14   Garald Balding, NP  HYDROcodone-acetaminophen (NORCO/VICODIN) [id number]  MG per tablet Take 1 tablet by mouth every 6 (six) hours as needed for moderate pain. 03/11/14   Garald Balding, NP   BP 130/71  Pulse 108  Temp(Src) 98.6 F (37 C) (Oral)  Resp 20  Ht 4\' 11"  (1.499 m)  Wt 113 lb (51.256 kg)  BMI 22.81 kg/m2  SpO2 100%  LMP 02/18/2014  Breastfeeding? No Physical Exam  Nursing note and vitals reviewed. Constitutional: She appears well-developed and well-nourished.  HENT:  Head: Normocephalic.  Eyes: Pupils are equal, round, and reactive to light.  Neck: Normal range of motion.  Cardiovascular: Normal rate and regular rhythm.   Pulmonary/Chest: Effort normal. She has wheezes.  Musculoskeletal: Normal range of motion.   Neurological: She is alert.  Skin: Skin is warm. There is erythema.       ED Course  Procedures (including critical care time) Labs Review Labs Reviewed - No data to display  Imaging Review No results found.   EKG Interpretation None      MDM   Final diagnoses:  Bronchitis with bronchospasm  Cellulitis of back except buttock    Wheezing cleared after one albuterol treatment.  Patient is provided with an albuterol inhaler and spacer to go home, with she's also been prescribed Keflex 250 mg 4 times a day for 7 days.  To treat her cellulitis as well as Vicodin for pain control     Garald Balding, NP 03/11/14 [phone]

## 2014-03-11 NOTE — ED Notes (Signed)
Patient presents stating she has had a cough for more than a week.  Cough sounds dry but states she does cough up a lot of mucous at times.  Also has an opened area to her spine from falling down the steps

## 2014-03-11 NOTE — ED Provider Notes (Signed)
Medical screening examination/treatment/procedure(s) were conducted as a shared visit with non-physician practitioner(s) and myself.  I personally evaluated the patient during the encounter.   EKG Interpretation None      Pt with cc of wheezing, and lung exam shows some wheezing. PA-C to evaluate for PNA, othewise tx as bronchitis.   Varney Biles, MD 03/11/14 [phone]

## 2014-03-11 NOTE — Discharge Instructions (Signed)
Acute Bronchitis Bronchitis is when the airways that extend from the windpipe into the lungs get red, puffy, and painful (inflamed). Bronchitis often causes thick spit (mucus) to develop. This leads to a cough. A cough is the most common symptom of bronchitis. In acute bronchitis, the condition usually begins suddenly and goes away over time (usually in 2 weeks). Smoking, allergies, and asthma can make bronchitis worse. Repeated episodes of bronchitis may cause more lung problems. HOME CARE  Rest.  Drink enough fluids to keep your pee (urine) clear or pale yellow (unless you need to limit fluids as told by your doctor).  Only take over-the-counter or prescription medicines as told by your doctor.  Avoid smoking and secondhand smoke. These can make bronchitis worse. If you are a smoker, think about using nicotine gum or skin patches. Quitting smoking will help your lungs heal faster.  Reduce the chance of getting bronchitis again by:  Washing your hands often.  Avoiding people with cold symptoms.  Trying not to touch your hands to your mouth, nose, or eyes.  Follow up with your doctor as told. GET HELP IF: Your symptoms do not improve after 1 week of treatment. Symptoms include:  Cough.  Fever.  Coughing up thick spit.  Body aches.  Chest congestion.  Chills.  Shortness of breath.  Sore throat. GET HELP RIGHT AWAY IF:   You have an increased fever.  You have chills.  You have severe shortness of breath.  You have bloody thick spit (sputum).  You throw up (vomit) often.  You lose too much body fluid (dehydration).  You have a severe headache.  You faint. MAKE SURE YOU:   Understand these instructions.  Will watch your condition.  Will get help right away if you are not doing well or get worse. Document Released: 12/30/2007 Document Revised: 03/15/2013 Document Reviewed: 01/03/2013 [facility] Patient Information 2015 [place], [place]. This information is not  intended to replace advice given to you by your health care provider. Make sure you discuss any questions you have with your health care provider. Continue taking the steroids that, you were given at the urgent care.  If also been given a prescription for Keflex to take for your cellulitis or infection.  On your back.  You've been provided with inhaler.  Please uses as follows 2 puffs every 4-6 hours while awake for the next 2 days, then as needed.  Thereafter every 6 hours as needed

## 2014-03-12 ENCOUNTER — Emergency Department (HOSPITAL_COMMUNITY)
Admission: EM | Admit: 2014-03-12 | Discharge: 2014-03-12 | Disposition: A | Discharge status: Home / Self Care | Payer: Medicaid Other | Attending: Emergency Medicine | Admitting: Emergency Medicine

## 2014-03-12 ENCOUNTER — Emergency Department (HOSPITAL_COMMUNITY)
Admission: EM | Admit: 2014-03-12 | Discharge: 2014-03-12 | Disposition: A | Discharge status: Home / Self Care | Payer: Medicaid Other | Source: Home / Self Care | Attending: Family Medicine | Admitting: Family Medicine

## 2014-03-12 ENCOUNTER — Emergency Department (HOSPITAL_COMMUNITY): Payer: Medicaid Other

## 2014-03-12 ENCOUNTER — Encounter (HOSPITAL_COMMUNITY): Payer: Self-pay | Admitting: Emergency Medicine

## 2014-03-12 DIAGNOSIS — G8911 Acute pain due to trauma: Secondary | ICD-10-CM | POA: Insufficient documentation

## 2014-03-12 DIAGNOSIS — M549 Dorsalgia, unspecified: Secondary | ICD-10-CM | POA: Diagnosis present

## 2014-03-12 DIAGNOSIS — Z792 Long term (current) use of antibiotics: Secondary | ICD-10-CM | POA: Diagnosis not present

## 2014-03-12 DIAGNOSIS — L03818 Cellulitis of other sites: Secondary | ICD-10-CM | POA: Diagnosis not present

## 2014-03-12 DIAGNOSIS — F411 Generalized anxiety disorder: Secondary | ICD-10-CM | POA: Diagnosis not present

## 2014-03-12 DIAGNOSIS — IMO0002 Reserved for concepts with insufficient information to code with codable children: Secondary | ICD-10-CM | POA: Diagnosis not present

## 2014-03-12 DIAGNOSIS — L02818 Cutaneous abscess of other sites: Secondary | ICD-10-CM

## 2014-03-12 DIAGNOSIS — Z8679 Personal history of other diseases of the circulatory system: Secondary | ICD-10-CM | POA: Insufficient documentation

## 2014-03-12 DIAGNOSIS — N189 Chronic kidney disease, unspecified: Secondary | ICD-10-CM | POA: Diagnosis not present

## 2014-03-12 DIAGNOSIS — Z862 Personal history of diseases of the blood and blood-forming organs and certain disorders involving the immune mechanism: Secondary | ICD-10-CM | POA: Diagnosis not present

## 2014-03-12 DIAGNOSIS — F17200 Nicotine dependence, unspecified, uncomplicated: Secondary | ICD-10-CM | POA: Insufficient documentation

## 2014-03-12 LAB — POCT URINALYSIS DIP (DEVICE)
Bilirubin Urine: NEGATIVE
Glucose, UA: NEGATIVE mg/dL
HGB URINE DIPSTICK: NEGATIVE
KETONES UR: NEGATIVE mg/dL
Nitrite: NEGATIVE
PH: 7 (ref 5.0–8.0)
PROTEIN: NEGATIVE mg/dL
Specific Gravity, Urine: 1.015 (ref 1.005–1.030)
Urobilinogen, UA: 0.2 mg/dL (ref 0.0–1.0)

## 2014-03-12 MED ORDER — DOXYCYCLINE HYCLATE 100 MG PO CAPS: 100.0000 mg | ORAL_CAPSULE | Freq: Two times a day (BID) | ORAL | Status: DC

## 2014-03-12 MED ORDER — OXYCODONE-ACETAMINOPHEN 5-325 MG PO TABS: 1.0000 | ORAL_TABLET | Freq: Three times a day (TID) | ORAL | Status: DC | PRN

## 2014-03-12 MED ORDER — KETOROLAC TROMETHAMINE 60 MG/2ML IM SOLN
60.0000 mg | Freq: Once | INTRAMUSCULAR | Status: AC
  Administered 2014-03-12: 60 mg via INTRAMUSCULAR
  Filled 2014-03-12: qty 2

## 2014-03-12 MED ORDER — KETOROLAC TROMETHAMINE 10 MG PO TABS: 10.0000 mg | ORAL_TABLET | Freq: Four times a day (QID) | ORAL | Status: DC | PRN

## 2014-03-12 MED ORDER — CLINDAMYCIN HCL 150 MG PO CAPS: 300.0000 mg | ORAL_CAPSULE | Freq: Three times a day (TID) | ORAL | Status: DC

## 2014-03-12 MED ORDER — CLINDAMYCIN HCL 300 MG PO CAPS
300.0000 mg | ORAL_CAPSULE | Freq: Once | ORAL | Status: AC
  Administered 2014-03-12: 300 mg via ORAL
  Filled 2014-03-12: qty 1

## 2014-03-12 NOTE — ED Provider Notes (Signed)
Medical screening examination/treatment/procedure(s) were conducted as a shared visit with non-physician practitioner(s) and myself.  I personally evaluated the patient during the encounter.  Patient is s/p abrasion to the mid back secondary to falling down steps a few days ago. Seen yesterday by Dr. Kathrynn Humble and diagnosed with cellulitis. Discharged with Keflex and Vicodin. Says that pain persists. Patient noted to be tachycardic on arrival. Denies fever. Says that Vicodin is "worthless". Patient has a mildly indurated and erythematous margin around abrasion, no fluctuance. Patient is sulfa allergic. We will broaden coverage by changing to Clindamycin and d/c Keflex. Chart review shows the patient has been tachycardic during multiple previous PCP visits. I have discussed the importance of close follow up.    Elyn Peers, MD 03/12/14 [phone]

## 2014-03-12 NOTE — Discharge Instructions (Signed)
Thank you for coming in today. I think you have a skin infection.  Continue clindamycin Start doxycycline Use oxycodone for severe pain Return in 2 days.  Go to the emergency room if worse or not improving.  Come back or go to the emergency room if you notice new weakness new numbness problems walking or bowel or bladder problems.    Cellulitis Cellulitis is an infection of the skin and the tissue beneath it. The infected area is usually red and tender. Cellulitis occurs most often in the arms and lower legs.  CAUSES  Cellulitis is caused by bacteria that enter the skin through cracks or cuts in the skin. The most common types of bacteria that cause cellulitis are staphylococci and streptococci. SIGNS AND SYMPTOMS   Redness and warmth.  Swelling.  Tenderness or pain.  Fever. DIAGNOSIS  Your health care provider can usually determine what is wrong based on a physical exam. Blood tests may also be done. TREATMENT  Treatment usually involves taking an antibiotic medicine. HOME CARE INSTRUCTIONS   Take your antibiotic medicine as directed by your health care provider. Finish the antibiotic even if you start to feel better.  Keep the infected arm or leg elevated to reduce swelling.  Apply a warm cloth to the affected area up to 4 times per day to relieve pain.  Take medicines only as directed by your health care provider.  Keep all follow-up visits as directed by your health care provider. SEEK MEDICAL CARE IF:   You notice red streaks coming from the infected area.  Your red area gets larger or turns dark in color.  Your bone or joint underneath the infected area becomes painful after the skin has healed.  Your infection returns in the same area or another area.  You notice a swollen bump in the infected area.  You develop new symptoms.  You have a fever. SEEK IMMEDIATE MEDICAL CARE IF:   You feel very sleepy.  You develop vomiting or diarrhea.  You have a  general ill feeling (malaise) with muscle aches and pains. MAKE SURE YOU:   Understand these instructions.  Will watch your condition.  Will get help right away if you are not doing well or get worse. Document Released: 04/22/2005 Document Revised: 11/27/2013 Document Reviewed: 09/28/2011 [facility] Patient Information 2015 [place], [place]. This information is not intended to replace advice given to you by your health care provider. Make sure you discuss any questions you have with your health care provider.

## 2014-03-12 NOTE — ED Provider Notes (Signed)
CSN: [id number]     Arrival date & time 03/12/14  4665 History   First MD Initiated Contact with Patient 03/12/14 [phone]     Chief Complaint  Patient presents with  . Back Pain     (Consider location/radiation/quality/duration/timing/severity/associated sxs/prior Treatment) Patient is a 23 y.o. female presenting with back pain. The history is provided by the patient and medical records.  Back Pain  This is a 23 y.o. F with PMH significant for MVP, frequent headaches, presenting to the ED for back pain.  Patient states she fell down some steps on 03/02/14, striking her mid-back against the step.  No head injury or LOC.  Was seen last night for the same and started on vicodin and keflex but states her sx are not improving. She states her medications are not helping.  No numbness, paresthesias or weakness of extremities.  No loss of bowel or bladder control.  Denies fever, chills, sweats.  Last took 2 vicodin just prior to EMS arrival.  No hx of MRSA.  No prior hx of back injuries or surgeries.  No hx IDVU or cancer.  Past Medical History  Diagnosis Date  . Dysuria   . Headache(784.0)   . Allergy   . Anemia   . Anxiety   . Hypothyroid     history of.  . Chronic kidney disease     kidney infection.  . Abortion 08/04/2011  . Mitral valve prolapse    Past Surgical History  Procedure Laterality Date  . Dilation and curettage of uterus      abortion   Family History  Problem Relation Age of Onset  . Cancer Mother     SKIN  . Asthma Brother   . Diabetes Maternal Grandmother   . Emphysema Paternal Grandfather    History  Substance Use Topics  . Smoking status: Current Every Day Smoker -- 0.50 packs/day  . Smokeless tobacco: Never Used  . Alcohol Use: Yes     Comment: occasional but not while pregnant   OB History   Grav Para Term Preterm Abortions TAB SAB Ect Mult Living   5 3 3  0 2 2  0 0 3     Review of Systems  Musculoskeletal: Positive for back pain.  All other systems  reviewed and are negative.     Allergies  Other; Red dye; and Sulfa antibiotics  Home Medications   Prior to Admission medications   Medication Sig Start Date End Date Taking? Authorizing Provider  cephALEXin (KEFLEX) 250 MG capsule Take 1 capsule (250 mg total) by mouth 4 (four) times daily. 03/11/14   Garald Balding, NP  etonogestrel (NEXPLANON) 68 MG IMPL implant Inject 1 each into the skin once. 2014    Historical Provider, MD  HYDROcodone-acetaminophen (NORCO/VICODIN) 5-325 MG per tablet Take 1 tablet by mouth every 6 (six) hours as needed for moderate pain. 03/11/14   Garald Balding, NP  ipratropium (ATROVENT) 0.06 % nasal spray Place 2 sprays into both nostrils 4 (four) times daily.    Historical Provider, MD  omeprazole (PRILOSEC) 20 MG capsule Take 20 mg by mouth daily.    Historical Provider, MD  predniSONE (DELTASONE) 20 MG tablet Take 20 mg by mouth 2 (two) times daily.    Historical Provider, MD  traMADol (ULTRAM) 50 MG tablet Take 50-100 mg by mouth every 8 (eight) hours as needed (cough).    Historical Provider, MD   BP 98/73  Pulse 108  Temp(Src) 98.2 F (36.8 C) (  Oral)  Resp 20  Wt 113 lb (51.256 kg)  SpO2 95%  LMP 02/18/2014  Physical Exam  Nursing note and vitals reviewed. Constitutional: She is oriented to person, place, and time. She appears well-developed and well-nourished. No distress.  Talking on cell phone, NAD  HENT:  Head: Normocephalic and atraumatic.  Mouth/Throat: Oropharynx is clear and moist.  Eyes: Conjunctivae and EOM are normal. Pupils are equal, round, and reactive to light.  Neck: Normal range of motion. Neck supple.  Cardiovascular: Normal rate, regular rhythm and normal heart sounds.   Pulmonary/Chest: Effort normal and breath sounds normal. No respiratory distress. She has no wheezes.  Musculoskeletal: Normal range of motion.  Deep abrasion to midline lower thoracic spine; localized erythema, scab present with clear drainage noted; no  fluctuance or abscess formation noted; full ROM of TS maintained; normal strength and sensation of extremities x4  Neurological: She is alert and oriented to person, place, and time.  Skin: Skin is warm and dry. She is not diaphoretic.  Psychiatric: She has a normal mood and affect.    ED Course  Procedures (including critical care time) Labs Review Labs Reviewed - No data to display  Imaging Review No results found.   EKG Interpretation None      MDM   Final diagnoses:  Back pain, unspecified location   Imaging negative for acute findings. No red flag sx on exam, patient remains neurologically intact. Significant relief with toradol given in ED, will d/c home with same.  Given erythema to her back, will d/c keflex and switch abx to clindamyin for broader coverage.  Recommended establishing care with PCP in the area for close FU, resource guide given.  Discussed plan with patient, he/she acknowledged understanding and agreed with plan of care.  Return precautions given for new or worsening symptoms.  Larene Pickett, PA-C 03/12/14 1128

## 2014-03-12 NOTE — ED Notes (Signed)
Patient presents with c/o pain to her back after falling down some steps approx 2 weeks ago.  Red area noted to the middle of her back  Was given Keflex and Vicodin last night for the same

## 2014-03-12 NOTE — ED Provider Notes (Signed)
Eileen [name] is a 23 y.o. female who presents to Urgent Care today for Cellulitis. Patient suffered a fall approximately 10 days ago landing on her back. She's been to the emergency room recently for worsening pain. Her antibiotics were switched from Keflex to clindamycin about 12 hours ago. She notes the pain is worsened since. She denies any fevers chills or diarrhea or abdominal pain. She denies any weakness or numbness. She notes Toradol is not sufficient to control her pain.   Past Medical History  Diagnosis Date  . Dysuria   . Headache(784.0)   . Allergy   . Anemia   . Anxiety   . Hypothyroid     history of.  . Chronic kidney disease     kidney infection.  . Abortion 08/04/2011  . Mitral valve prolapse    History  Substance Use Topics  . Smoking status: Current Every Day Smoker -- 0.50 packs/day  . Smokeless tobacco: Never Used  . Alcohol Use: Yes     Comment: occasional but not while pregnant   ROS as above Medications: No current facility-administered medications for this encounter.   Current Outpatient Prescriptions  Medication Sig Dispense Refill  . clindamycin (CLEOCIN) 150 MG capsule Take 2 capsules (300 mg total) by mouth 3 (three) times daily. May dispense as 150mg  capsules  60 capsule  0  . doxycycline (VIBRAMYCIN) 100 MG capsule Take 1 capsule (100 mg total) by mouth 2 (two) times daily.  14 capsule  0  . etonogestrel (NEXPLANON) 68 MG IMPL implant Inject 1 each into the skin once. 2014      . oxyCODONE-acetaminophen (PERCOCET/ROXICET) 5-325 MG per tablet Take 1-2 tablets by mouth every 8 (eight) hours as needed for severe pain.  15 tablet  0    Exam:  BP 135/81  Pulse 126  Temp(Src) 98.9 F (37.2 C) (Oral)  Resp 16  SpO2 100%  LMP 02/18/2014 Gen: Well NAD Back:  Skin: erythema and induration approximately 5 cm in length and 3 cm in height at the midline of the lumbar spine. Tender to touch.  Results for orders placed during the hospital encounter of  03/12/14 (from the past 24 hour(s))  POCT URINALYSIS DIP (DEVICE)     Status: Abnormal   Collection Time    03/12/14  8:20 PM      Result Value Ref Range   Glucose, UA NEGATIVE  NEGATIVE mg/dL   Bilirubin Urine NEGATIVE  NEGATIVE   Ketones, ur NEGATIVE  NEGATIVE mg/dL   Specific Gravity, Urine 1.015  1.005 - 1.030   Hgb urine dipstick NEGATIVE  NEGATIVE   pH 7.0  5.0 - 8.0   Protein, ur NEGATIVE  NEGATIVE mg/dL   Urobilinogen, UA 0.2  0.0 - 1.0 mg/dL   Nitrite NEGATIVE  NEGATIVE   Leukocytes, UA TRACE (*) NEGATIVE   Dg Thoracic Spine 2 View  03/12/2014   CLINICAL DATA:  Fall with upper thoracic spine pain.  EXAM: THORACIC SPINE - 2 VIEW  COMPARISON:  CT chest 11/16/2009.  FINDINGS: Alignment is anatomic. Vertebral body and disc space height are maintained. Visualized portions of the lungs are clear.  IMPRESSION: No findings to explain the patient's pain.   Electronically Signed   By: Lorin Picket M.D.   On: 03/12/2014 06:59    Assessment and Plan: 23 y.o. female with cellulitis. Plan to switch to doxycycline. Additionally use oxycodone for pain control. Is not rapidly improving recommend followup with emergency department for IV antibiotics. I have drawn  a border around the current area of cellulitis.  Discussed warning signs or symptoms. Please see discharge instructions. Patient expresses understanding.   This note was created using Systems analyst. Any transcription errors are unintended.    Gregor Hams, MD 03/12/14 2107

## 2014-03-12 NOTE — Discharge Instructions (Signed)
Take the prescribed medication as directed. Follow-up with a primary care physician in the area.  Resource guide attached to help with this. Return to the ED for new or worsening symptoms.   Emergency Department Resource Guide 1) Find a Doctor and Pay Out of Pocket Although you won't have to find out who is covered by your insurance plan, it is a good idea to ask around and get recommendations. You will then need to call the office and see if the doctor you have chosen will accept you as a new patient and what types of options they offer for patients who are self-pay. Some doctors offer discounts or will set up payment plans for their patients who do not have insurance, but you will need to ask so you aren't surprised when you get to your appointment.  2) Contact Your Local Health Department Not all health departments have doctors that can see patients for sick visits, but many do, so it is worth a call to see if yours does. If you don't know where your local health department is, you can check in your phone book. The CDC also has a tool to help you locate your state's health department, and many state websites also have listings of all of their local health departments.  3) Find a [facility] If your illness is not likely to be very severe or complicated, you may want to try a walk in clinic. These are popping up all over the country in pharmacies, drugstores, and shopping centers. They're usually staffed by nurse practitioners or physician assistants that have been trained to treat common illnesses and complaints. They're usually fairly quick and inexpensive. However, if you have serious medical issues or chronic medical problems, these are probably not your best option.  No Primary Care Doctor: - Call Health Connect at  [phone] - they can help you locate a primary care doctor that  accepts your insurance, provides certain services, etc. - Physician Referral Service- [phone]  Chronic  Pain Problems: Organization         Address  Phone   Notes  [facility]  [phone] Patients need to be referred by their primary care doctor.   Medication Assistance: Organization         Address  Phone   Notes  [facility] Medication [facility] [address] [place], [place] [address] [phone] --Must be a resident of [facility] -- Must have NO insurance coverage whatsoever (no Medicaid/ Medicare, etc.) -- The pt. MUST have a primary care doctor that directs their care regularly and follows them in the community   MedAssist  [phone]   [employer]  [phone]    Agencies that provide inexpensive medical care: Organization         Address  Phone   Notes  [place]  [phone]   [facility]    [phone]   [facility] [place], [place] [address] [phone]   [place] [address] [place] [phone]   Planned Parenthood    [phone]   [facility]    [phone]   [place] and [address], [place] Phone:  [phone], Fax:  [phone] Hours of Operation:  9 am - 6 pm, M-F.  Also accepts Medicaid/Medicare and self-pay.  [facility]  [place] [employer], [address], [place] Phone: [phone], Fax: [phone]. Hours of Operation:  8:30 am - 5:30 pm, M-F.  Also accepts Medicaid and self-pay.  [facility] [place] [address], [place] Phone: [phone]   [place], [place], [place] [phone], Ext. [phone] Mondays & Thursdays: 7-9 AM.  First 15 patients are seen on a first come, first serve basis.    [place] Providers:  Organization         Address  Phone   Notes  [facility] [address], [place] [phone] Also  accepts self-pay patients.  [facility] [address] [place], [place]  [phone]   [place], [address], [place] [phone]   [facility] [address], [place] [phone]   Lucianne Lei [address], [place]   [phone] Only accepts [place] Access [place] patients after they have their name applied to their card.   Self-Pay (no insurance) in [facility]:  Organization         Address  Phone   Notes  Sickle Cell Patients, [facility] Internal [facility] [place] [phone]   [facility] Urgent Care [place] [phone]   [facility] [place]  [place], [place], [place] [phone]   [facility]/Dr. Osei-Bonsu  [address] [place] or [address], [place] [phone] Phone number for both [place] and [place] locations is the same.  Urgent Medical and [facility] [address], [place] [phone]   [facility] [address] [place] or [address] [phone]   [facility] [address] [place] [phone], phone; [phone], fax Sees patients 1st and 3rd Saturday of every month.  Must not qualify for public or private insurance (i.e. Medicaid, Medicare, [place] Health Choice, Veterans' Benefits)  Household income should be no more than 200% of the poverty level The clinic cannot treat you if you are pregnant or think you are pregnant  Sexually transmitted diseases are not treated at the clinic.    Dental Care: Organization         Address  Phone  Notes  [facility] Department of [facility] [place] [phone] Accepts children up to age 37 who are enrolled in [place] or [place]; pregnant  women with a Medicaid card; and children who have applied for Medicaid or [place] Health Choice, but were declined, whose parents can pay a reduced fee at time of service.  [facility] Department of [facility]  [address], [place] [phone] Accepts children up to age 58 who are enrolled in [place] or [place]; pregnant women with a Medicaid card; and children who have applied for Medicaid or [place] Health Choice, but were declined, whose parents can pay a reduced fee at time of service.  [facility] Adult [facility] Access PROGRAM  [place] [phone] Patients are seen by appointment only. Walk-ins are not accepted. [place] will see patients 22 years of age and older. Monday - Tuesday (8am-5pm) Most Wednesdays (8:30-5pm) $30 per visit, cash only  [place] Adult  Dental Access PROGRAM  [address], [facility] [phone] Patients are seen by appointment only. Walk-ins are not accepted. [place] will see patients 60 years of age and older. One Wednesday Evening (Monthly: Volunteer Based).  $30 per visit, cash only  [place]  [phone] for adults; Children under age 4, call Graduate Pediatric Dentistry at [phone]. Children aged 49-14, please call [phone] to request a pediatric application.  Dental services are provided in all areas of dental care including fillings, crowns and bridges, complete and partial dentures, implants, gum treatment, root canals, and extractions. Preventive care is also provided. Treatment is provided to both adults and children. Patients are selected via a lottery and there is often a waiting list.   [facility] [address] [place]  [phone] [email or web address]   [facility] [address] [place], [place] [phone], Ext. [phone] Second and Fourth Thursday of each month, opens at 6:30 AM; Clinic ends at 9 AM.  Patients are  seen on a first-come first-served basis, and a limited number are seen during each clinic.   [facility]  [address] Hillard Danker [place], [place] [phone]   Eligibility Requirements You must have lived in [place], [place], or [place] counties for at least the last three months.   You cannot be eligible for state or federal sponsored [employer], including [employer], [place], or [employer].   You generally cannot be eligible for healthcare insurance through your employer.    How to apply: Eligibility screenings are held every Tuesday and Wednesday afternoon from 1:00 pm until 4:00 pm. You do not need an appointment for the interview!  [facility] [address] [place], [place]   [place]  [place] Department  [place]  [phone]    Behavioral Health Resources in the Community: Intensive Outpatient Programs Organization         Address  Phone  Notes  [place]. [address], [place], [place] [phone]   [facility] Outpatient [address], [place], [place]   ADS: Alcohol & Drug Svcs [address], [place], [place]   [place] [address],  [place], [place] or [phone]   Substance Abuse Resources Organization         Address  Phone  Notes  Alcohol and Drug Services  [phone]   [place]  [phone]   The [place]   Chinita Pester  [phone]   Residential & Outpatient Substance Abuse Program  [phone]   Psychological Services Organization         Address  Phone  Notes  [facility] [place]  [place]  [phone]   [place] [address] [place] [phone] or [phone]    Mobile Crisis  Teams Organization         Address  Phone  Notes  [facility]atives, Mobile Crisis Care Unit  [phone]   Assertive Psychotherapeutic Services  [address] [place], [place]   [place] Levels [address] [place] [phone]    Self-Help/Support Groups Organization         Address  Phone             Notes  Mental  Health Assoc. of [place] - variety of support groups  [place] Call for more information  Narcotics Anonymous (NA), [facility] [address], [employer] [place]  2 meetings at this location   Special educational needs teacher         Address  Phone  Notes  ASAP Residential Treatment [place],    [place]  [phone]   [facility]  [address] [place] [address]86168, [place], [place]   [place], [place] [phone] Admissions: 8am-3pm M-F  Incentives Substance [place] [address]    [place], [place] [phone]   [facility] [address] [place], [place], [place]   [facility] [address]  [place], [place]   Insight Programs - Intensive Outpatient [address] Kristeen [address], [place], [place]   [facility] ([place] [address].,  [place], [place] [phone] or [phone]   [facility] Services (RTS) [address], [place], [place] Accepts Medicaid  [facility] [address]  [place] [phone] Substance Abuse/Addiction Treatment   [facility] Organization         Address  Phone  Notes  [facility]  [phone]   Domenic Schwab, PhD [address] Arlis Porta [place], [place]   [phone] or [phone]   [place], [place] [phone]   [facility] [address], [place], [place] [phone]  Insurance/Medicaid/sponsorship through [facility] and Families [address], [address] [place]                                    [place], [place] [phone] [place] [address] [place], [place] [phone]    Dr. Adele Schilder  [phone]   [facility] Dept. 1) [address], [place] 2) [place] 3)  [place] [address], [place] [phone]  [phone]   [place] [phone] or [phone] (After Hours)

## 2014-03-12 NOTE — ED Notes (Signed)
Patient reports she fell on wooden stairs x 2 weeks ago. She has mid back pain with an abrasion. She also has neck pain. Patient reports she is taking antibiotics as prescribed but still has a lot of pain and feels like she may have an infection. Patient is alert and oriented and in no acute distress.

## 2014-03-18 NOTE — ED Provider Notes (Signed)
Medical screening examination/treatment/procedure(s) were performed by non-physician practitioner and as supervising physician I was immediately available for consultation/collaboration.   EKG Interpretation None        Elyn Peers, MD 03/18/14 [phone]

## 2014-03-20 ENCOUNTER — Ambulatory Visit: Payer: Medicaid Other | Admitting: Obstetrics & Gynecology

## 2014-03-29 ENCOUNTER — Encounter: Payer: Self-pay | Admitting: Obstetrics & Gynecology

## 2014-03-29 ENCOUNTER — Ambulatory Visit (INDEPENDENT_AMBULATORY_CARE_PROVIDER_SITE_OTHER): Payer: Medicaid Other | Admitting: Obstetrics & Gynecology

## 2014-03-29 ENCOUNTER — Other Ambulatory Visit: Payer: Self-pay | Admitting: Obstetrics & Gynecology

## 2014-03-29 VITALS — BP 108/73 | HR 101 | Ht 59.0 in | Wt 107.0 lb

## 2014-03-29 DIAGNOSIS — N949 Unspecified condition associated with female genital organs and menstrual cycle: Secondary | ICD-10-CM

## 2014-03-29 DIAGNOSIS — Z30430 Encounter for insertion of intrauterine contraceptive device: Secondary | ICD-10-CM

## 2014-03-29 DIAGNOSIS — N9089 Other specified noninflammatory disorders of vulva and perineum: Secondary | ICD-10-CM

## 2014-03-29 DIAGNOSIS — N938 Other specified abnormal uterine and vaginal bleeding: Secondary | ICD-10-CM

## 2014-03-29 DIAGNOSIS — Z113 Encounter for screening for infections with a predominantly sexual mode of transmission: Secondary | ICD-10-CM

## 2014-03-29 DIAGNOSIS — Z3046 Encounter for surveillance of implantable subdermal contraceptive: Secondary | ICD-10-CM

## 2014-03-29 NOTE — Progress Notes (Signed)
   Subjective:    [name], female    DOB: [birth year], 23 y.o.   MRN: [id number]  HPI  23 yo Separated W P3 (8 month, 63 yo, 5yo kids) here today for evaluation of irregular bleeding. She has had the Nexplanon in since her 6 weeks pp visit. She initially had a normal period each month for 6 months and since then has been bleeding all month except maybe a week without bleeding. She would like the Nexplanon removed and a Mirena IUD placed. She is aware that irregular bleeding is also a side effect with Mirena.  She also has a 2 day h/o a "bump", painful to touch, on her right lower labia majora/perineum. She has a new sex partner about 3 weeks ago.  Review of Systems  She declines a flu vaccine.    Objective:   Physical Exam Normal appearing cervix  Consent was signed and time out was done. Her right arm was prepped with betadine after establishing the position of the Nexplanon. The area was infiltrated with 2 cc of 1% lidocaine. A small incision was made and the intact rod was easily removed. A steristrip was placed and her arm was noted to be hemostatic. It was bandaged.  She tolerated the procedure well.  Consent signed, Time out procedure done. Cervix prepped with betadine and grasped with a single tooth tenaculum. Mirena was easily placed and the strings were cut to 3-4 cm. Uterus sounded to 9 cm. She tolerated the procedure well.         Assessment & Plan:  DUB- probably due to the Nexplanon but I will r/o infection, thyroid disease

## 2014-03-30 LAB — TSH: TSH: 4.666 u[IU]/mL — ABNORMAL HIGH (ref 0.350–4.500)

## 2014-03-30 LAB — HSV 2 ANTIBODY, IGG

## 2014-03-30 LAB — GC/CHLAMYDIA PROBE AMP
CT Probe RNA: NEGATIVE
GC Probe RNA: NEGATIVE

## 2014-04-03 ENCOUNTER — Telehealth: Payer: Self-pay | Admitting: *Deleted

## 2014-04-03 LAB — T4, FREE: FREE T4: 1.08 ng/dL (ref 0.80–1.80)

## 2014-04-03 LAB — T3, FREE: T3 FREE: 2.8 pg/mL (ref 2.3–4.2)

## 2014-04-03 NOTE — Telephone Encounter (Signed)
Notified patient of lab results.  The lesion on her vagina has resolved, she is reassured.  I have added a Free T3 and Free T4 to her abnormal TSH.  She is not currently on any medication but has been experiencing abnormal bleeding and increased hair loss.  She has been on medication for her thyroid in the past.

## 2014-05-02 ENCOUNTER — Ambulatory Visit (INDEPENDENT_AMBULATORY_CARE_PROVIDER_SITE_OTHER): Payer: Medicaid Other | Admitting: Obstetrics & Gynecology

## 2014-05-02 ENCOUNTER — Encounter: Payer: Self-pay | Admitting: Obstetrics & Gynecology

## 2014-05-02 VITALS — BP 108/65 | HR 90 | Ht 59.0 in | Wt 109.0 lb

## 2014-05-02 DIAGNOSIS — N949 Unspecified condition associated with female genital organs and menstrual cycle: Secondary | ICD-10-CM

## 2014-05-02 DIAGNOSIS — G8929 Other chronic pain: Secondary | ICD-10-CM

## 2014-05-02 DIAGNOSIS — N926 Irregular menstruation, unspecified: Secondary | ICD-10-CM

## 2014-05-02 DIAGNOSIS — R102 Pelvic and perineal pain: Principal | ICD-10-CM

## 2014-05-02 MED ORDER — NAPROXEN 500 MG PO TABS: 500.0000 mg | ORAL_TABLET | Freq: Two times a day (BID) | ORAL | Status: DC

## 2014-05-02 NOTE — Progress Notes (Signed)
   CLINIC ENCOUNTER NOTE  History:  23 y.o. [id number] here today for followup of continued irregular bleeding and worsening pain despite Mirena placement one month ago.  Patient is convinced she has endometriosis and wants evaluation for this.  Negative other evaluation so far; negative thyroid panel and pelvic ultrasound in 01/23/14.   The following portions of the patient's history were reviewed and updated as appropriate: allergies, current medications, past family history, past medical history, past social history, past surgical history and problem list. Normal pap in 01/09/14.  Review of Systems:  Pertinent items are noted in HPI.  Objective:  Physical Exam BP 108/65  Pulse 90  Ht 4\' 11"  (1.499 m)  Wt 109 lb (49.442 kg)  BMI 22.00 kg/m2  Breastfeeding? No Gen: NAD Abd: Soft, nontender and nondistended Pelvic: Normal appearing external genitalia; normal appearing vaginal mucosa and cervix. IUD strings not visualized and not palpated using endocervical brush.  Normal discharge, no blood seen.  Small uterus, diffuse mild uterine and adnexal tenderness (L>R)  Assessment & Plan:  Patient was told the irregular bleeding could still be a side effect of the IUD; but this has been going on before the IUD was placed.  She declines trial of OCPs as estrogen makes her sick; even "the lowest level of estrogen".  Trial of Provera in past did not help her bleeding. Declines Depo Provera for now, wants laparoscopy due to worsening pain.  Furthermore, IUD strings are not visualized, will obtain ultrasound to evaluate for location. Will also obtain other evaluation labs for her bleeding and follow up results.     Patient desires surgical evaluation with laparoscopy.  The risks of surgery were discussed in detail with the patient. The patient also understands the alternative treatment options which were discussed in full. All questions were answered.  She was told that she will be contacted by our surgical  scheduler regarding the time and date of her surgery. Printed patient education handouts about the procedure were given to the patient to review at home.  Pain and bleeding precautions reviewed.   Verita Schneiders, MD, [place] Attending [place] for [employer], [place]

## 2014-05-03 NOTE — Patient Instructions (Signed)
Return to clinic for any scheduled appointments or for any gynecologic concerns as needed.   

## 2014-05-05 ENCOUNTER — Other Ambulatory Visit: Payer: Self-pay | Admitting: Obstetrics & Gynecology

## 2014-05-05 DIAGNOSIS — N926 Irregular menstruation, unspecified: Secondary | ICD-10-CM

## 2014-05-05 DIAGNOSIS — R102 Pelvic and perineal pain: Principal | ICD-10-CM

## 2014-05-05 DIAGNOSIS — G8929 Other chronic pain: Secondary | ICD-10-CM

## 2014-05-07 ENCOUNTER — Ambulatory Visit (HOSPITAL_COMMUNITY): Admission: RE | Admit: 2014-05-07 | Payer: Medicaid Other | Source: Ambulatory Visit

## 2014-05-10 ENCOUNTER — Encounter (HOSPITAL_COMMUNITY): Payer: Self-pay | Admitting: *Deleted

## 2014-05-16 ENCOUNTER — Encounter: Payer: Self-pay | Admitting: Family Medicine

## 2014-05-16 ENCOUNTER — Ambulatory Visit (INDEPENDENT_AMBULATORY_CARE_PROVIDER_SITE_OTHER): Payer: Medicaid Other | Admitting: Family Medicine

## 2014-05-16 VITALS — BP 103/77 | HR 96 | Ht 59.0 in | Wt 108.0 lb

## 2014-05-16 DIAGNOSIS — R102 Pelvic and perineal pain unspecified side: Secondary | ICD-10-CM | POA: Insufficient documentation

## 2014-05-16 DIAGNOSIS — N921 Excessive and frequent menstruation with irregular cycle: Secondary | ICD-10-CM

## 2014-05-16 LAB — CBC
HEMATOCRIT: 36.5 % (ref 36.0–46.0)
HEMOGLOBIN: 11.6 g/dL — AB (ref 12.0–15.0)
MCH: 24.1 pg — ABNORMAL LOW (ref 26.0–34.0)
MCHC: 31.8 g/dL (ref 30.0–36.0)
MCV: 75.7 fL — AB (ref 78.0–100.0)
Platelets: 201 10*3/uL (ref 150–400)
RBC: 4.82 MIL/uL (ref 3.87–5.11)
RDW: 17.6 % — ABNORMAL HIGH (ref 11.5–15.5)
WBC: 6.1 10*3/uL (ref 4.0–10.5)

## 2014-05-16 LAB — HCG, QUANTITATIVE, PREGNANCY: hCG, Beta Chain, Quant, S: <2 m[IU]/mL

## 2014-05-16 LAB — PROLACTIN: PROLACTIN: 3.4 ng/mL

## 2014-05-16 NOTE — Patient Instructions (Signed)
Menorrhagia Menorrhagia is the medical term for when your menstrual periods are heavy or last longer than usual. With menorrhagia, every period you have may cause enough blood loss and cramping that you are unable to maintain your usual activities. CAUSES  In some cases, the cause of heavy periods is unknown, but a number of conditions may cause menorrhagia. Common causes include:  A problem with the hormone-producing thyroid gland (hypothyroid).  Noncancerous growths in the uterus (polyps or fibroids).  An imbalance of the estrogen and progesterone hormones.  One of your ovaries not releasing an egg during one or more months.  Side effects of having an intrauterine device (IUD).  Side effects of some medicines, such as anti-inflammatory medicines or blood thinners.  A bleeding disorder that stops your blood from clotting normally. SIGNS AND SYMPTOMS  During a normal period, bleeding lasts between 4 and 8 days. Signs that your periods are too heavy include:  You routinely have to change your pad or tampon every 1 or 2 hours because it is completely soaked.  You pass blood clots larger than 1 inch (2.5 cm) in size.  You have bleeding for more than 7 days.  You need to use pads and tampons at the same time because of heavy bleeding.  You need to wake up to change your pads or tampons during the night.  You have symptoms of anemia, such as tiredness, fatigue, or shortness of breath. DIAGNOSIS  Your health care provider will perform a physical exam and ask you questions about your symptoms and menstrual history. Other tests may be ordered based on what the health care provider finds during the exam. These tests can include:  Blood tests. Blood tests are used to check if you are pregnant or have hormonal changes, a bleeding or thyroid disorder, low iron levels (anemia), or other problems.  Endometrial biopsy. Your health care provider takes a sample of tissue from the inside of your  uterus to be examined under a microscope.  Pelvic ultrasound. This test uses sound waves to make a picture of your uterus, ovaries, and vagina. The pictures can show if you have fibroids or other growths.  Hysteroscopy. For this test, your health care provider will use a small telescope to look inside your uterus. Based on the results of your initial tests, your health care provider may recommend further testing. TREATMENT  Treatment may not be needed. If it is needed, your health care provider may recommend treatment with one or more medicines first. If these do not reduce bleeding enough, a surgical treatment might be an option. The best treatment for you will depend on:   Whether you need to prevent pregnancy.  Your desire to have children in the future.  The cause and severity of your bleeding.  Your opinion and personal preference.  Medicines for menorrhagia may include:  Birth control methods that use hormones. These include the pill, skin patch, vaginal ring, shots that you get every 3 months, hormonal IUD, and implant. These treatments reduce bleeding during your menstrual period.  Medicines that thicken blood and slow bleeding.  Medicines that reduce swelling, such as ibuprofen.  Medicines that contain a synthetic hormone called progestin.   Medicines that make the ovaries stop working for a short time.  You may need surgical treatment for menorrhagia if the medicines are unsuccessful. Treatment options include:  Dilation and curettage (D&C). In this procedure, your health care provider opens (dilates) your cervix and then scrapes or suctions tissue from   the lining of your uterus to reduce menstrual bleeding.  Operative hysteroscopy. This procedure uses a tiny tube with a light (hysteroscope) to view your uterine cavity and can help in the surgical removal of a polyp that may be causing heavy periods.  Endometrial ablation. Through various techniques, your health care  provider permanently destroys the entire lining of your uterus (endometrium). After endometrial ablation, most women have little or no menstrual flow. Endometrial ablation reduces your ability to become pregnant.  Endometrial resection. This surgical procedure uses an electrosurgical wire loop to remove the lining of the uterus. This procedure also reduces your ability to become pregnant.  Hysterectomy. Surgical removal of the uterus and cervix is a permanent procedure that stops menstrual periods. Pregnancy is not possible after a hysterectomy. This procedure requires anesthesia and hospitalization. HOME CARE INSTRUCTIONS   Only take over-the-counter or prescription medicines as directed by your health care provider. Take prescribed medicines exactly as directed. Do not change or switch medicines without consulting your health care provider.  Take any prescribed iron pills exactly as directed by your health care provider. Long-term heavy bleeding may result in low iron levels. Iron pills help replace the iron your body lost from heavy bleeding. Iron may cause constipation. If this becomes a problem, increase the bran, fruits, and roughage in your diet.  Do not take aspirin or medicines that contain aspirin 1 week before or during your menstrual period. Aspirin may make the bleeding worse.  If you need to change your sanitary pad or tampon more than once every 2 hours, stay in bed and rest as much as possible until the bleeding stops.  Eat well-balanced meals. Eat foods high in iron. Examples are leafy green vegetables, meat, liver, eggs, and whole grain breads and cereals. Do not try to lose weight until the abnormal bleeding has stopped and your blood iron level is back to normal. SEEK MEDICAL CARE IF:   You soak through a pad or tampon every 1 or 2 hours, and this happens every time you have a period.  You need to use pads and tampons at the same time because you are bleeding so much.  You  need to change your pad or tampon during the night.  You have a period that lasts for more than 8 days.  You pass clots bigger than 1 inch wide.  You have irregular periods that happen more or less often than once a month.  You feel dizzy or faint.  You feel very weak or tired.  You feel short of breath or feel your heart is beating too fast when you exercise.  You have nausea and vomiting or diarrhea while you are taking your medicine.  You have any problems that may be related to the medicine you are taking. SEEK IMMEDIATE MEDICAL CARE IF:   You soak through 4 or more pads or tampons in 2 hours.  You have any bleeding while you are pregnant. MAKE SURE YOU:   Understand these instructions.  Will watch your condition.  Will get help right away if you are not doing well or get worse. Document Released: 07/13/2005 Document Revised: 07/18/2013 Document Reviewed: 01/01/2013 ExitCare Patient Information 2015 [employer]. This information is not intended to replace advice given to you by your health care provider. Make sure you discuss any questions you have with your health care provider. Pelvic Pain Female pelvic pain can be caused by many different things and start from a variety of places. Pelvic pain   refers to pain that is located in the lower half of the abdomen and between your hips. The pain may occur over a short period of time (acute) or may be reoccurring (chronic). The cause of pelvic pain may be related to disorders affecting the female reproductive organs (gynecologic), but it may also be related to the bladder, kidney stones, an intestinal complication, or muscle or skeletal problems. Getting help right away for pelvic pain is important, especially if there has been severe, sharp, or a sudden onset of unusual pain. It is also important to get help right away because some types of pelvic pain can be life threatening.  CAUSES  Below are only some of the causes of pelvic  pain. The causes of pelvic pain can be in one of several categories.   Gynecologic.  Pelvic inflammatory disease.  Sexually transmitted infection.  Ovarian cyst or a twisted ovarian ligament (ovarian torsion).  Uterine lining that grows outside the uterus (endometriosis).  Fibroids, cysts, or tumors.  Ovulation.  Pregnancy.  Pregnancy that occurs outside the uterus (ectopic pregnancy).  Miscarriage.  Labor.  Abruption of the placenta or ruptured uterus.  Infection.  Uterine infection (endometritis).  Bladder infection.  Diverticulitis.  Miscarriage related to a uterine infection (septic abortion).  Bladder.  Inflammation of the bladder (cystitis).  Kidney stone(s).  Gastrointestinal.  Constipation.  Diverticulitis.  Neurologic.  Trauma.  Feeling pelvic pain because of mental or emotional causes (psychosomatic).  Cancers of the bowel or pelvis. EVALUATION  Your caregiver will want to take a careful history of your concerns. This includes recent changes in your health, a careful gynecologic history of your periods (menses), and a sexual history. Obtaining your family history and medical history is also important. Your caregiver may suggest a pelvic exam. A pelvic exam will help identify the location and severity of the pain. It also helps in the evaluation of which organ system may be involved. In order to identify the cause of the pelvic pain and be properly treated, your caregiver may order tests. These tests may include:   A pregnancy test.  Pelvic ultrasonography.  An X-ray exam of the abdomen.  A urinalysis or evaluation of vaginal discharge.  Blood tests. HOME CARE INSTRUCTIONS   Only take over-the-counter or prescription medicines for pain, discomfort, or fever as directed by your caregiver.   Rest as directed by your caregiver.   Eat a balanced diet.   Drink enough fluids to make your urine clear or pale yellow, or as directed.    Avoid sexual intercourse if it causes pain.   Apply warm or cold compresses to the lower abdomen depending on which one helps the pain.   Avoid stressful situations.   Keep a journal of your pelvic pain. Write down when it started, where the pain is located, and if there are things that seem to be associated with the pain, such as food or your menstrual cycle.  Follow up with your caregiver as directed.  SEEK MEDICAL CARE IF:  Your medicine does not help your pain.  You have abnormal vaginal discharge. SEEK IMMEDIATE MEDICAL CARE IF:   You have heavy bleeding from the vagina.   Your pelvic pain increases.   You feel light-headed or faint.   You have chills.   You have pain with urination or blood in your urine.   You have uncontrolled diarrhea or vomiting.   You have a fever or persistent symptoms for more than 3 days.  You have a   fever and your symptoms suddenly get worse.   You are being physically or sexually abused.  MAKE SURE YOU:  Understand these instructions.  Will watch your condition.  Will get help if you are not doing well or get worse. Document Released: 06/09/2004 Document Revised: 11/27/2013 Document Reviewed: 11/02/2011 ExitCare Patient Information 2015 [employer]. This information is not intended to replace advice given to you by your health care provider. Make sure you discuss any questions you have with your health care provider.  

## 2014-05-16 NOTE — Progress Notes (Signed)
    Subjective:    [name] is a 23 y.o. female presenting with bump on cervix  on 05/16/2014  HPI: Patient checking her IUD strings and felt a lump on the left side. Seems to be smaller now. Still having abnormal bleeding of unclear etiology.  Has changed from [place] to [facility] laparoscopy on 10/30 to look for endometriosis. Has had round of Doxycycline with no improvement.  Review of Systems  Constitutional: Negative for fever and chills.  Respiratory: Negative for shortness of breath.   Cardiovascular: Negative for chest pain.  Gastrointestinal: Negative for nausea, vomiting and abdominal pain.  Genitourinary: Negative for dysuria.  Skin: Negative for rash.      Objective:    BP 103/77  Pulse 96  Ht 4\' 11"  (1.499 m)  Wt 108 lb (48.988 kg)  BMI 21.80 kg/m2  Breastfeeding? No Physical Exam  Constitutional: She is oriented to person, place, and time. She appears well-developed and well-nourished. No distress.  HENT:  Head: Normocephalic and atraumatic.  Eyes: No scleral icterus.  Neck: Neck supple.  Cardiovascular: Normal rate.   Pulmonary/Chest: Effort normal.  Abdominal: Soft.  Genitourinary: Vagina normal and uterus normal. Cervix exhibits no motion tenderness, no discharge and no friability.  No lesion on cervix--it is fish-mouthed.  IUD strings are not easily seen.  Neurological: She is alert and oriented to person, place, and time.  Skin: Skin is warm and dry.  Psychiatric: She has a normal mood and affect.        Assessment & Plan:   No evidence of abnormal cervix. Problem List Items Addressed This Visit     Unprioritized   Menorrhagia with irregular cycle - Primary     Unclear etiology--would advise keeping her IUD in place and waiting to see is this issue resolves itself.    Pelvic pain in female     For laparoscopy next week.      Declined flu shot  Return in about 3 months (around 08/16/2014).

## 2014-05-16 NOTE — Assessment & Plan Note (Signed)
For laparoscopy next week.

## 2014-05-16 NOTE — Assessment & Plan Note (Signed)
Unclear etiology--would advise keeping her IUD in place and waiting to see is this issue resolves itself.

## 2014-05-17 LAB — TESTOSTERONE, FREE, TOTAL, SHBG
Sex Hormone Binding: 42 nmol/L (ref 18–114)
TESTOSTERONE-% FREE: 1.5 % (ref 0.4–2.4)
TESTOSTERONE: 21 ng/dL (ref 10–70)
Testosterone, Free: 3.2 pg/mL (ref 0.6–6.8)

## 2014-05-18 LAB — VON WILLEBRAND PANEL: Coagulation Factor VIII: 40 % — ABNORMAL LOW (ref 73–140)

## 2014-05-21 ENCOUNTER — Encounter (HOSPITAL_COMMUNITY): Payer: Self-pay | Admitting: Pharmacist

## 2014-05-25 ENCOUNTER — Ambulatory Visit (HOSPITAL_COMMUNITY)
Admission: RE | Admit: 2014-05-25 | Discharge: 2014-05-25 | Disposition: A | Discharge status: Home / Self Care | Payer: Medicaid Other | Source: Ambulatory Visit | Attending: Obstetrics & Gynecology | Admitting: Obstetrics & Gynecology

## 2014-05-25 ENCOUNTER — Ambulatory Visit (HOSPITAL_COMMUNITY): Payer: Medicaid Other | Admitting: Anesthesiology

## 2014-05-25 ENCOUNTER — Encounter (HOSPITAL_COMMUNITY): Payer: Medicaid Other | Admitting: Anesthesiology

## 2014-05-25 ENCOUNTER — Encounter (HOSPITAL_COMMUNITY)
Admission: RE | Disposition: A | Discharge status: Home / Self Care | Payer: Self-pay | Source: Ambulatory Visit | Attending: Obstetrics & Gynecology

## 2014-05-25 ENCOUNTER — Encounter (HOSPITAL_COMMUNITY): Payer: Self-pay | Admitting: Emergency Medicine

## 2014-05-25 DIAGNOSIS — G8929 Other chronic pain: Secondary | ICD-10-CM

## 2014-05-25 DIAGNOSIS — N925 Other specified irregular menstruation: Secondary | ICD-10-CM | POA: Insufficient documentation

## 2014-05-25 DIAGNOSIS — R102 Pelvic and perineal pain: Secondary | ICD-10-CM

## 2014-05-25 HISTORY — PX: LAPAROSCOPY: SHX197

## 2014-05-25 LAB — CBC
HCT: 36.3 % (ref 36.0–46.0)
Hemoglobin: 11.6 g/dL — ABNORMAL LOW (ref 12.0–15.0)
MCH: 25.1 pg — ABNORMAL LOW (ref 26.0–34.0)
MCHC: 32 g/dL (ref 30.0–36.0)
MCV: 78.6 fL (ref 78.0–100.0)
PLATELETS: 190 10*3/uL (ref 150–400)
RBC: 4.62 MIL/uL (ref 3.87–5.11)
RDW: 16.8 % — AB (ref 11.5–15.5)
WBC: 5.6 10*3/uL (ref 4.0–10.5)

## 2014-05-25 LAB — PREGNANCY, URINE: Preg Test, Ur: NEGATIVE

## 2014-05-25 SURGERY — LAPAROSCOPY, DIAGNOSTIC
Anesthesia: General | Site: Abdomen

## 2014-05-25 MED ORDER — KETOROLAC TROMETHAMINE 30 MG/ML IJ SOLN
30.0000 mg | Freq: Once | INTRAMUSCULAR | Status: AC
  Administered 2014-05-25: 30 mg via INTRAVENOUS

## 2014-05-25 MED ORDER — MIDAZOLAM HCL 2 MG/2ML IJ SOLN
INTRAMUSCULAR | Status: DC | PRN
  Administered 2014-05-25: 2 mg via INTRAVENOUS

## 2014-05-25 MED ORDER — NEOSTIGMINE METHYLSULFATE 10 MG/10ML IV SOLN
INTRAVENOUS | Status: AC
  Filled 2014-05-25: qty 1

## 2014-05-25 MED ORDER — MIDAZOLAM HCL 2 MG/2ML IJ SOLN
INTRAMUSCULAR | Status: AC
  Filled 2014-05-25: qty 2

## 2014-05-25 MED ORDER — IBUPROFEN 600 MG PO TABS: 600.0000 mg | ORAL_TABLET | Freq: Four times a day (QID) | ORAL | Status: DC | PRN

## 2014-05-25 MED ORDER — FENTANYL CITRATE 0.05 MG/ML IJ SOLN
25.0000 ug | INTRAMUSCULAR | Status: DC | PRN
  Administered 2014-05-25 (×2): 25 ug via INTRAVENOUS

## 2014-05-25 MED ORDER — SCOPOLAMINE 1 MG/3DAYS TD PT72
MEDICATED_PATCH | TRANSDERMAL | Status: AC
  Administered 2014-05-25: 1.5 mg via TRANSDERMAL
  Filled 2014-05-25: qty 1

## 2014-05-25 MED ORDER — SUCCINYLCHOLINE CHLORIDE 20 MG/ML IJ SOLN
INTRAMUSCULAR | Status: AC
  Filled 2014-05-25: qty 10

## 2014-05-25 MED ORDER — LIDOCAINE HCL (CARDIAC) 20 MG/ML IV SOLN
INTRAVENOUS | Status: DC | PRN
  Administered 2014-05-25: 50 mg via INTRAVENOUS

## 2014-05-25 MED ORDER — FENTANYL CITRATE 0.05 MG/ML IJ SOLN
INTRAMUSCULAR | Status: AC
  Filled 2014-05-25: qty 2

## 2014-05-25 MED ORDER — LACTATED RINGERS IV SOLN: INTRAVENOUS | Status: DC

## 2014-05-25 MED ORDER — DOCUSATE SODIUM 100 MG PO CAPS: 100.0000 mg | ORAL_CAPSULE | Freq: Two times a day (BID) | ORAL | Status: DC | PRN

## 2014-05-25 MED ORDER — LACTATED RINGERS IV SOLN
INTRAVENOUS | Status: DC
  Administered 2014-05-25: 1000 mL via INTRAVENOUS
  Administered 2014-05-25 (×3): via INTRAVENOUS

## 2014-05-25 MED ORDER — DEXAMETHASONE SODIUM PHOSPHATE 10 MG/ML IJ SOLN
INTRAMUSCULAR | Status: AC
  Filled 2014-05-25: qty 1

## 2014-05-25 MED ORDER — SCOPOLAMINE 1 MG/3DAYS TD PT72
1.0000 | MEDICATED_PATCH | Freq: Once | TRANSDERMAL | Status: DC
  Administered 2014-05-25: 1.5 mg via TRANSDERMAL

## 2014-05-25 MED ORDER — ROCURONIUM BROMIDE 100 MG/10ML IV SOLN
INTRAVENOUS | Status: DC | PRN
  Administered 2014-05-25: 5 mg via INTRAVENOUS
  Administered 2014-05-25: 10 mg via INTRAVENOUS

## 2014-05-25 MED ORDER — KETOROLAC TROMETHAMINE 30 MG/ML IJ SOLN
INTRAMUSCULAR | Status: DC
Start: 2014-05-25 — End: 2014-05-25
  Filled 2014-05-25: qty 1

## 2014-05-25 MED ORDER — SUCCINYLCHOLINE CHLORIDE 20 MG/ML IJ SOLN
INTRAMUSCULAR | Status: DC | PRN
  Administered 2014-05-25: 100 mg via INTRAVENOUS

## 2014-05-25 MED ORDER — DEXAMETHASONE SODIUM PHOSPHATE 10 MG/ML IJ SOLN
INTRAMUSCULAR | Status: DC | PRN
  Administered 2014-05-25: 10 mg via INTRAVENOUS

## 2014-05-25 MED ORDER — LIDOCAINE HCL (CARDIAC) 20 MG/ML IV SOLN
INTRAVENOUS | Status: AC
  Filled 2014-05-25: qty 5

## 2014-05-25 MED ORDER — PROPOFOL 10 MG/ML IV EMUL
INTRAVENOUS | Status: AC
  Filled 2014-05-25: qty 20

## 2014-05-25 MED ORDER — BUPIVACAINE HCL (PF) 0.5 % IJ SOLN
INTRAMUSCULAR | Status: AC
Start: 2014-05-25 — End: 2014-05-25
  Filled 2014-05-25: qty 30

## 2014-05-25 MED ORDER — FENTANYL CITRATE 0.05 MG/ML IJ SOLN
INTRAMUSCULAR | Status: AC
  Filled 2014-05-25: qty 5

## 2014-05-25 MED ORDER — PROPOFOL 10 MG/ML IV BOLUS
INTRAVENOUS | Status: DC | PRN
  Administered 2014-05-25: 30 mg via INTRAVENOUS
  Administered 2014-05-25: 150 mg via INTRAVENOUS

## 2014-05-25 MED ORDER — ONDANSETRON HCL 4 MG/2ML IJ SOLN
INTRAMUSCULAR | Status: AC
  Filled 2014-05-25: qty 2

## 2014-05-25 MED ORDER — NEOSTIGMINE METHYLSULFATE 10 MG/10ML IV SOLN
INTRAVENOUS | Status: DC | PRN
  Administered 2014-05-25: 2 mg via INTRAVENOUS

## 2014-05-25 MED ORDER — GLYCOPYRROLATE 0.2 MG/ML IJ SOLN
INTRAMUSCULAR | Status: DC | PRN
  Administered 2014-05-25: 0.4 mg via INTRAVENOUS

## 2014-05-25 MED ORDER — SODIUM CHLORIDE 0.9 % IR SOLN
Status: DC | PRN
  Administered 2014-05-25: 1000 mL

## 2014-05-25 MED ORDER — BUPIVACAINE HCL (PF) 0.5 % IJ SOLN
INTRAMUSCULAR | Status: DC | PRN
  Administered 2014-05-25 (×2): 10 mL

## 2014-05-25 MED ORDER — OXYCODONE-ACETAMINOPHEN 5-325 MG PO TABS: 1.0000 | ORAL_TABLET | Freq: Four times a day (QID) | ORAL | Status: DC | PRN

## 2014-05-25 MED ORDER — FENTANYL CITRATE 0.05 MG/ML IJ SOLN
INTRAMUSCULAR | Status: DC | PRN
  Administered 2014-05-25 (×2): 50 ug via INTRAVENOUS

## 2014-05-25 MED ORDER — ONDANSETRON HCL 4 MG/2ML IJ SOLN
INTRAMUSCULAR | Status: DC | PRN
  Administered 2014-05-25: 4 mg via INTRAVENOUS

## 2014-05-25 MED ORDER — ROCURONIUM BROMIDE 100 MG/10ML IV SOLN
INTRAVENOUS | Status: AC
  Filled 2014-05-25: qty 1

## 2014-05-25 SURGICAL SUPPLY — 32 items
BAG SPEC RTRVL LRG 6X4 10 (ENDOMECHANICALS)
BLADE SURG 11 STRL SS (BLADE) ×3 IMPLANT
CABLE HIGH FREQUENCY MONO STRZ (ELECTRODE) IMPLANT
CLOTH BEACON ORANGE TIMEOUT ST (SAFETY) ×3 IMPLANT
COVER MAYO STAND STRL (DRAPES) ×3 IMPLANT
DRSG COVADERM PLUS 2X2 (GAUZE/BANDAGES/DRESSINGS) ×6 IMPLANT
DRSG OPSITE POSTOP 3X4 (GAUZE/BANDAGES/DRESSINGS) ×2 IMPLANT
DURAPREP 26ML APPLICATOR (WOUND CARE) ×3 IMPLANT
GLOVE BIOGEL PI IND STRL 7.0 (GLOVE) ×1 IMPLANT
GLOVE BIOGEL PI IND STRL 7.5 (GLOVE) IMPLANT
GLOVE BIOGEL PI INDICATOR 7.0 (GLOVE) ×2
GLOVE BIOGEL PI INDICATOR 7.5 (GLOVE) ×2
GLOVE ECLIPSE 7.0 STRL STRAW (GLOVE) ×3 IMPLANT
GLOVE ECLIPSE 7.5 STRL STRAW (GLOVE) ×2 IMPLANT
GOWN STRL REUS W/TWL LRG LVL3 (GOWN DISPOSABLE) ×6 IMPLANT
LIQUID BAND (GAUZE/BANDAGES/DRESSINGS) ×2 IMPLANT
NS IRRIG 1000ML POUR BTL (IV SOLUTION) ×3 IMPLANT
PACK LAPAROSCOPY BASIN (CUSTOM PROCEDURE TRAY) ×3 IMPLANT
PAD TRENDELENBURG OR TABLE (MISCELLANEOUS) ×3 IMPLANT
POUCH SPECIMEN RETRIEVAL 10MM (ENDOMECHANICALS) IMPLANT
PROTECTOR NERVE ULNAR (MISCELLANEOUS) ×3 IMPLANT
SET IRRIG TUBING LAPAROSCOPIC (IRRIGATION / IRRIGATOR) IMPLANT
SHEARS HARMONIC ACE PLUS 36CM (ENDOMECHANICALS) IMPLANT
SUT VIC AB 3-0 X1 27 (SUTURE) IMPLANT
SUT VICRYL 0 UR6 27IN ABS (SUTURE) ×6 IMPLANT
SUT VICRYL 4-0 PS2 18IN ABS (SUTURE) ×3 IMPLANT
TOWEL OR 17X24 6PK STRL BLUE (TOWEL DISPOSABLE) ×6 IMPLANT
TRAY FOLEY CATH 14FR (SET/KITS/TRAYS/PACK) ×3 IMPLANT
TROCAR XCEL NON-BLD 11X100MML (ENDOMECHANICALS) ×3 IMPLANT
TROCAR XCEL NON-BLD 5MMX100MML (ENDOMECHANICALS) IMPLANT
WARMER LAPAROSCOPE (MISCELLANEOUS) ×3 IMPLANT
WATER STERILE IRR 1000ML POUR (IV SOLUTION) ×3 IMPLANT

## 2014-05-25 NOTE — Anesthesia Postprocedure Evaluation (Signed)
  Anesthesia Post-op Note  Patient: Eileen [name]  Procedure(s) Performed: Procedure(s): LAPAROSCOPY DIAGNOSTIC (N/A)  Patient Location: PACU  Anesthesia Type:General  Level of Consciousness: awake, alert  and oriented  Airway and Oxygen Therapy: Patient Spontanous Breathing  Post-op Pain: mild  Post-op Assessment: Post-op Vital signs reviewed, Patient's Cardiovascular Status Stable, Respiratory Function Stable, Patent Airway, No signs of Nausea or vomiting and Pain level controlled  Post-op Vital Signs: Reviewed and stable  Last Vitals:  Filed Vitals:   05/25/14 1745  BP: 99/56  Pulse: 62  Temp:   Resp: 9    Complications: No apparent anesthesia complications

## 2014-05-25 NOTE — Discharge Instructions (Signed)
Laparoscopic Surgery - Care After Laparoscopy is a surgical procedure. It is used to diagnose and treat diseases inside the belly(abdomen). It is usually a brief, common, and relatively simple procedure. The laparoscopeis a thin, lighted, pencil-sized instrument. It is like a telescope. It is inserted into your abdomen through a small cut (incision). Your caregiver can look at the organs inside your body through this instrument.  She can see if there is anything abnormal. Laparoscopy can be done either in a hospital or outpatient clinic. You may be given a mild sedative to help you relax before the procedure. Once in the operating room, you will be given a drug to make you sleep (general anesthesia). Laparoscopy usually lasts about 1 hour. After the procedure, you will be monitored in a recovery area until you are stable and doing well. Once you are home, it may take 3 to 7 days to fully recover.  Laparoscopy has relatively few risks. Your caregiver will discuss the risks with you before the procedure. Some problems that can occur include: RISKS AND COMPLICATIONS  Allergies to medicines. Difficulty breathing. Bleeding. Infection. Damage to other surrounding structures HOME CARE INSTRUCTIONS  Infection. Bleeding. Damage to other organs. Anesthetic side effects.  Need for additional procedures such as open procedures/laparotomy PROCEDURE Once you receive anesthesia, your surgeon inflates the abdomen with a harmless gas (carbon dioxide). This makes the organs easier to see. The laparoscope is inserted into the abdomen through a small incision. This allows your surgeon to see into the abdomen. Other small instruments are also inserted into the abdomen through other small openings. Many surgeons attach a video camera to the laparoscope to enlarge the view. During a laparoscopy, the surgeon may be looking for inflammation, infection, or cancer.  The surgeon may also need to take out certain organs or  take tissue samples (biopsies). The specimens are sent to a specialist in looking at cells and tissue samples (pathologist). The pathologist examines them under a microscope to help to diagnose or confirm a disease. AFTER THE PROCEDURE  The incisions are closed with stitches (sutures) and Dermabond. Because these incisions are small (usually less than 1/2 inch), there is usually minimal discomfort after the procedure. There may also be discomfort from the instrument placement incisions in the abdomen. You will be given pain medicine to ease any discomfort. You will rest in a recovery room for 1-2 hours until you are stable and doing well. You may have some mild discomfort in the throat. This is from the tube placed in your throat while you were sleeping. You may experience discomfort in the shoulder area from some trapped air between the liver and diaphragm. This sensation is normal and will slowly go away on its own. The recovery time is shortened as long as there are no complications. You will rest in a recovery room until stable and doing well. As long as there are no complications, you may be allowed to go home. Someone will need to drive you home and be with you for at least 24 hours once home. FINDING OUT THE RESULTS You will be called with the results of the pathology and will discuss these results with  your caregiver during your postoperative appointment. Do not assume everything is normal if you have not heard from your caregiver or the medical facility. It is important for you to follow up on all of your results. HOME CARE INSTRUCTIONS  Take all medicines as directed. Only take over-the-counter or prescription medicines for pain, discomfort,   CARE INSTRUCTIONS   Take all medicines as directed.  Only take over-the-counter or prescription medicines for pain, discomfort, or fever as directed by your caregiver.  Resume daily activities as directed.  Showers are preferred over baths.  You may resume sexual activities in 1 week or as directed.  Do not drive while taking  narcotics. SEEK MEDICAL CARE IF:  There is increasing abdominal pain.  You feel lightheaded or faint.  You have the chills.  You have an oral temperature above 102 F (38.9 C).  There is pus-like (purulent) drainage from any of the wounds.  You are unable to pass gas or have a bowel movement.  You feel sick to your stomach (nauseous) or throw up (vomit). MAKE SURE YOU:   Understand these instructions.  Will watch your condition.  Will get help right away if you are not doing well or get worse.  ExitCare Patient Information 2013 [place]. \ Post Anesthesia Home Care Instructions  Activity: Get plenty of rest for the remainder of the day. A responsible adult should stay with you for 24 hours following the procedure.  For the next 24 hours, DO NOT: -Drive a car -Paediatric nurse -Drink alcoholic beverages -Take any medication unless instructed by your physician -Make any legal decisions or sign important papers.  Meals: Start with liquid foods such as gelatin or soup. Progress to regular foods as tolerated. Avoid greasy, spicy, heavy foods. If nausea and/or vomiting occur, drink only clear liquids until the nausea and/or vomiting subsides. Call your physician if vomiting continues.  Special Instructions/Symptoms: Your throat may feel dry or sore from the anesthesia or the breathing tube placed in your throat during surgery. If this causes discomfort, gargle with warm salt water. The discomfort should disappear within 24 hours.

## 2014-05-25 NOTE — Transfer of Care (Signed)
Immediate Anesthesia Transfer of Care Note  Patient: Eileen [name]  Procedure(s) Performed: Procedure(s): LAPAROSCOPY DIAGNOSTIC (N/A)  Patient Location: PACU  Anesthesia Type:General  Level of Consciousness: sedated  Airway & Oxygen Therapy: Patient Spontanous Breathing  Post-op Assessment: Report given to PACU RN  Post vital signs: Reviewed and stable  Complications: No apparent anesthesia complications

## 2014-05-25 NOTE — Interval H&P Note (Signed)
History and Physical Interval Note 05/25/2014 2:57 PM  Eileen [name]  has presented today for surgery, with the diagnosis of Chronic pelvic pain and Irregular bleeding  The various methods of treatment have been discussed with the patient and family. After consideration of risks, benefits and other options for treatment, the patient has consented to [place] as a surgical intervention.  Patient is aware there is a possibility no etiology will be discovered during this surgery; and she may need further evaluation.  The patient's history has been reviewed, patient examined, no change in status, stable for surgery.  I have reviewed the patient's chart and labs.  Questions were answered to the patient's satisfaction.  To OR when ready.   Verita Schneiders, MD, [place] Attending [place], [facility]

## 2014-05-25 NOTE — Op Note (Signed)
Eileen [name] PROCEDURE DATE: 05/25/2014  PREOPERATIVE DIAGNOSIS: Chronic pelvic pain POSTOPERATIVE DIAGNOSIS: The same PROCEDURE: Diagnostic laparoscopy SURGEON:  Dr. Verita Schneiders  INDICATIONS: 23 y.o. [id number] with history of chronic pelvic pain desiring surgical evaluation.   Please see preoperative notes for further details.  Risks of surgery were discussed with the patient including but not limited to: bleeding which may require transfusion or reoperation; infection which may require antibiotics; injury to bowel, bladder, ureters or other surrounding organs; need for additional procedures including laparotomy; thromboembolic phenomenon, incisional problems and other postoperative/anesthesia complications. Written informed consent was obtained.    FINDINGS:  Small uterus, normal ovaries and fallopian tubes bilaterally.  No endometriosis lesions  Or evidence of chronic PID visualized in the pelvis or abdomen. One filmy adhesion of bowel to right mid abdomen area lateral to umbilicus was noted, this was lysed in a blunt fashion.  No other abdominal/pelvic abnormality.  Normal appendix and upper abdomen.  ANESTHESIA:    General INTRAVENOUS FLUIDS: 1400 ml ESTIMATED BLOOD LOSS: 5 ml URINE OUTPUT: 50 ml SPECIMENS: None COMPLICATIONS: None immediate  PROCEDURE IN DETAIL:  The patient had sequential compression devices applied to her lower extremities while in the preoperative area.  She was then taken to the operating room where general anesthesia was administered and was found to be adequate.  She was placed in the dorsal lithotomy position, and was prepped and draped in a sterile manner.  A Foley catheter was inserted into her bladder and attached to constant drainage and a uterine manipulator was then advanced into the uterus .  After an adequate timeout was performed, attention was turned to the abdomen where an umbilical incision was made with the scalpel.  The Optiview 11-mm trocar and sleeve  were then advanced without difficulty with the laparoscope under direct visualization into the abdomen.  The abdomen was then insufflated with carbon dioxide gas and adequate pneumoperitoneum was obtained.   A detailed survey of the patient's pelvis and abdomen revealed the findings as mentioned above, nothing concerning for endometriosis or chronic PID.  No intraoperative injury to surrounding organs was noted.  The abdomen was desufflated and all instruments were then removed from the patient's abdomen. The uterine manipulator was removed without complications.  The fascial incision was closed with 0 Vicryl and the skin incision with 4-0 Vicryl and Dermabond.   All instruments, needles, and sponge counts were correct x 2.  The patient tolerated the procedure well and was taken to the recovery room extubated and in stable condition.   The patient will be discharged to home as per PACU criteria.  Routine postoperative instructions given.  She was prescribed Percocet, Ibuprofen and Colace.  She will follow up in the clinic in 2-3 weeks for postoperative evaluation .   Verita Schneiders, MD, [place] Attending [place], [facility]

## 2014-05-25 NOTE — Anesthesia Preprocedure Evaluation (Addendum)
Anesthesia Evaluation  Patient identified by MRN, date of birth, ID band Patient awake    Reviewed: Allergy & Precautions, H&P , Patient's Chart, lab work & pertinent test results, reviewed documented beta blocker date and time   Airway Mallampati: II  TM Distance: >3 FB Neck ROM: full    Dental no notable dental hx.    Pulmonary Current Smoker,  breath sounds clear to auscultation  Pulmonary exam normal       Cardiovascular Rhythm:regular Rate:Normal     Neuro/Psych    GI/Hepatic   Endo/Other    Renal/GU      Musculoskeletal   Abdominal   Peds  Hematology   Anesthesia Other Findings Front tooth filling Symptomatic MVP- skipped beats occassionally  Reproductive/Obstetrics                            Anesthesia Physical Anesthesia Plan  ASA: II  Anesthesia Plan: General   Post-op Pain Management:    Induction: Intravenous  Airway Management Planned: Oral ETT  Additional Equipment:   Intra-op Plan:   Post-operative Plan: Extubation in OR  Informed Consent: I have reviewed the patients History and Physical, chart, labs and discussed the procedure including the risks, benefits and alternatives for the proposed anesthesia with the patient or authorized representative who has indicated his/her understanding and acceptance.   Dental Advisory Given and Dental advisory given  Plan Discussed with: CRNA and Surgeon  Anesthesia Plan Comments: (  Discussed general anesthesia, including possible nausea, instrumentation of airway, sore throat,pulmonary aspiration, etc. I asked if the were any outstanding questions, or  concerns before we proceeded. )        Anesthesia Quick Evaluation

## 2014-05-25 NOTE — H&P (View-Only) (Signed)
   CLINIC ENCOUNTER NOTE  History:  23 y.o. [id number] here today for followup of continued irregular bleeding and worsening pain despite Mirena placement one month ago.  Patient is convinced she has endometriosis and wants evaluation for this.  Negative other evaluation so far; negative thyroid panel and pelvic ultrasound in 01/23/14.   The following portions of the patient's history were reviewed and updated as appropriate: allergies, current medications, past family history, past medical history, past social history, past surgical history and problem list. Normal pap in 01/09/14.  Review of Systems:  Pertinent items are noted in HPI.  Objective:  Physical Exam BP 108/65  Pulse 90  Ht 4\' 11"  (1.499 m)  Wt 109 lb (49.442 kg)  BMI 22.00 kg/m2  Breastfeeding? No Gen: NAD Abd: Soft, nontender and nondistended Pelvic: Normal appearing external genitalia; normal appearing vaginal mucosa and cervix. IUD strings not visualized and not palpated using endocervical brush.  Normal discharge, no blood seen.  Small uterus, diffuse mild uterine and adnexal tenderness (L>R)  Assessment & Plan:  Patient was told the irregular bleeding could still be a side effect of the IUD; but this has been going on before the IUD was placed.  She declines trial of OCPs as estrogen makes her sick; even "the lowest level of estrogen".  Trial of Provera in past did not help her bleeding. Declines Depo Provera for now, wants laparoscopy due to worsening pain.  Furthermore, IUD strings are not visualized, will obtain ultrasound to evaluate for location. Will also obtain other evaluation labs for her bleeding and follow up results.     Patient desires surgical evaluation with laparoscopy.  The risks of surgery were discussed in detail with the patient. The patient also understands the alternative treatment options which were discussed in full. All questions were answered.  She was told that she will be contacted by our surgical  scheduler regarding the time and date of her surgery. Printed patient education handouts about the procedure were given to the patient to review at home.  Pain and bleeding precautions reviewed.   Verita Schneiders, MD, [place] Attending [place] for [employer], [place]

## 2014-05-28 ENCOUNTER — Encounter (HOSPITAL_COMMUNITY): Payer: Self-pay | Admitting: Obstetrics & Gynecology

## 2014-06-06 ENCOUNTER — Encounter: Payer: Self-pay | Admitting: Obstetrics & Gynecology

## 2014-06-06 ENCOUNTER — Ambulatory Visit (INDEPENDENT_AMBULATORY_CARE_PROVIDER_SITE_OTHER): Payer: Medicaid Other | Admitting: Obstetrics & Gynecology

## 2014-06-06 VITALS — BP 113/82 | HR 93 | Wt 106.0 lb

## 2014-06-06 DIAGNOSIS — R102 Pelvic and perineal pain: Secondary | ICD-10-CM

## 2014-06-06 DIAGNOSIS — N921 Excessive and frequent menstruation with irregular cycle: Secondary | ICD-10-CM

## 2014-06-06 DIAGNOSIS — R5382 Chronic fatigue, unspecified: Secondary | ICD-10-CM

## 2014-06-06 DIAGNOSIS — Z09 Encounter for follow-up examination after completed treatment for conditions other than malignant neoplasm: Secondary | ICD-10-CM

## 2014-06-06 DIAGNOSIS — L659 Nonscarring hair loss, unspecified: Secondary | ICD-10-CM

## 2014-06-06 MED ORDER — NORETHIN ACE-ETH ESTRAD-FE 1-20 MG-MCG(24) PO TABS: 1.0000 | ORAL_TABLET | Freq: Every day | ORAL | Status: DC

## 2014-06-06 MED ORDER — GABAPENTIN 300 MG PO CAPS: 300.0000 mg | ORAL_CAPSULE | Freq: Three times a day (TID) | ORAL | Status: DC

## 2014-06-06 NOTE — Progress Notes (Signed)
   CLINIC ENCOUNTER NOTE  History:  23 y.o. [id number] here today for postop follow up after laparoscopy for chronic pelvic pain.  No endometriosis or other pelvic etiology seen for her pain or AUB. Patient still complains of continued pain and spotting; is frustrated that endometriosis was not found.  Also reports chronic fatigue, hair loss or other systemic complaints.    The following portions of the patient's history were reviewed and updated as appropriate: allergies, current medications, past family history, past medical history, past social history, past surgical history and problem list. Normal pap and negative GC/Chlam on 01/09/14.  Mirena IUD in place.  Review of Systems:  Pertinent items are noted in HPI.  Objective:  Physical Exam BP 113/82 mmHg  Pulse 93  Wt 106 lb (48.081 kg)  Breastfeeding? No Gen: NAD Abd: Soft, nontender and nondistended. Well-healed laparoscopic site.  Pelvic: Deferred  Assessment & Plan:  Normal postoperative check Wants to try OCPs for AUB/breakthrough bleeding on Mirena IUD and Neurontin for chronic pelvic pain; this was prescribed. Endocrinology referral also made given other symptoms and possible untested hormonal etiology.  If this does not work; consider pelvic pain clinic, pelvic floor physical therapy, other specialists(GI/GU). Routine preventative health maintenance measures emphasized.   Verita Schneiders, MD, [place] Attending [place] for [employer], [place]

## 2014-06-06 NOTE — Patient Instructions (Signed)
Return to clinic for any obstetric concerns or go to MAU for evaluation  

## 2014-06-28 ENCOUNTER — Ambulatory Visit (INDEPENDENT_AMBULATORY_CARE_PROVIDER_SITE_OTHER): Payer: Medicaid Other | Admitting: Internal Medicine

## 2014-06-28 ENCOUNTER — Encounter: Payer: Self-pay | Admitting: Internal Medicine

## 2014-06-28 VITALS — BP 100/62 | HR 103 | Temp 97.9°F | Resp 12 | Ht 60.0 in | Wt 107.0 lb

## 2014-06-28 DIAGNOSIS — R5383 Other fatigue: Secondary | ICD-10-CM

## 2014-06-28 DIAGNOSIS — M25559 Pain in unspecified hip: Secondary | ICD-10-CM

## 2014-06-28 DIAGNOSIS — N921 Excessive and frequent menstruation with irregular cycle: Secondary | ICD-10-CM

## 2014-06-28 DIAGNOSIS — L659 Nonscarring hair loss, unspecified: Secondary | ICD-10-CM

## 2014-06-28 LAB — TSH: TSH: 3.93 u[IU]/mL (ref 0.35–4.50)

## 2014-06-28 LAB — LIPID PANEL
CHOLESTEROL: 212 mg/dL — AB (ref 0–200)
HDL: 32.5 mg/dL — ABNORMAL LOW (ref >39.00)
LDL CALC: 167 mg/dL — AB (ref 0–99)
NonHDL: 179.5
TRIGLYCERIDES: 62 mg/dL (ref 0.0–149.0)
Total CHOL/HDL Ratio: 7
VLDL: 12.4 mg/dL (ref 0.0–40.0)

## 2014-06-28 LAB — VITAMIN B12: Vitamin B-12: 403 pg/mL (ref 211–911)

## 2014-06-28 LAB — FOLLICLE STIMULATING HORMONE: FSH: 3 m[IU]/mL

## 2014-06-28 LAB — VITAMIN D 25 HYDROXY (VIT D DEFICIENCY, FRACTURES): VITD: 18.99 ng/mL — ABNORMAL LOW (ref 30.00–100.00)

## 2014-06-28 LAB — CORTISOL: CORTISOL PLASMA: 8.9 ug/dL

## 2014-06-28 LAB — T4, FREE: Free T4: 0.71 ng/dL (ref 0.60–1.60)

## 2014-06-28 LAB — T3, FREE: T3, Free: 3.7 pg/mL (ref 2.3–4.2)

## 2014-06-28 LAB — LUTEINIZING HORMONE: LH: 1.07 m[IU]/mL

## 2014-06-28 LAB — SEDIMENTATION RATE: Sed Rate: 14 mm/hr (ref 0–22)

## 2014-06-28 NOTE — Progress Notes (Signed)
[name], female   DOB: [birth year], 23 y.o.   MRN: [id number]  HPI: Eileen [name] is a 23 y.o. female, referred by her ObGyn Dr., Dr. Harolyn Rutherford, in consultation for excessive menses.  PPt has a h/o continuous bleeding x 5 mo before 04/2014 >> laparoscopy >> no abnormality >> now no bleeding for 1 mo. She has increased pain in pelvis, which persists. Also, fatigue and hair loss.   Reviewed labs: Component     Latest Ref Rng 05/16/2014  Testosterone     10 - 70 ng/dL 21  Sex Hormone Binding     18 - 114 nmol/L 42  Testosterone Free     0.6 - 6.8 pg/mL 3.2  Testosterone-% Free     0.4 - 2.4 % 1.5  Prolactin      3.4   She was started on LoEstrin 1 mo ago >> took 2 pills >> nausea/vomiting >> stopped.  Fertility/Menstrual cycles: - has a h/o regular menses - + h/o ovarian cysts >> 1 ruptured - children: 3 - 1, 4 ,59 y/o - miscarriages: 2 terminations - contraception: Mirena IUD  - placed 3 months Was on Norplant x ~6 months >> after this the periods became irregular  Weight gain: - no  Acne: - no  Hirsutism: - no  Other meds: - SSRIs: no - started gabapentin for pelvic pain (radiating to legs)  - Last thyroid tests: Lab Results  Component Value Date   TSH 4.666* 03/29/2014   FREET4 1.08 03/29/2014   She has FH of thyroid ds in mother (hypothyroidism). No FH of thyroid cancer. No iodine or herbal supplements.   Mother with ovarian cancer >> ovaries removed in her 37s.  ROS: Constitutional: no weight gain, + fatigue, + decreased appetite, hot flushes, + poor sleep Eyes: no blurry vision, no xerophthalmia ENT: no sore throat, no nodules palpated in throat, no dysphagia/odynophagia, no hoarseness Cardiovascular: no CP/SOB/+ palpitations/+ leg swelling Respiratory: no cough/SOB Gastrointestinal: + N/no V/D/C Musculoskeletal: + both: muscle/joint aches Skin: no acne, no hair on face, + easy bruising, + hair loss Neurological: no  tremors/numbness/tingling/dizziness, + HA Psychiatric: no depression/anxiety  Past Medical History  Diagnosis Date  . Dysuria   . Headache(784.0)   . Allergy   . Anemia   . Anxiety   . Hypothyroid     history of.  . Chronic kidney disease     kidney infection.  . Abortion 08/04/2011  . Mitral valve prolapse    Past Surgical History  Procedure Laterality Date  . Therapeutic abortion N/A 2012  . Laparoscopy N/A 05/25/2014    Procedure: LAPAROSCOPY DIAGNOSTIC;  Surgeon: Osborne Oman, MD;  Location: [facility];  Service: Gynecology;  Laterality: N/A;   History   Social History  . Marital Status: Legally Separated    Spouse Name: N/A    Number of Children: 3   Occupational History  . student   Social History Main Topics  . Smoking status: Current Every Day Smoker -- 0.50 packs/day  . Smokeless tobacco: Never Used  . Alcohol Use: Yes     Comment: occasional but not while pregnant  . Drug Use: No  . Sexual Activity:    Partners: Male   Current Outpatient Prescriptions on File Prior to Visit  Medication Sig Dispense Refill  . ibuprofen (ADVIL,MOTRIN) 600 MG tablet Take 1 tablet (600 mg total) by mouth every 6 (six) hours as needed. 60 tablet 3  . gabapentin (NEURONTIN) 300 MG capsule Take 1  capsule (300 mg total) by mouth 3 (three) times daily. (Patient not taking: Reported on 06/28/2014) 60 capsule 2  . Norethindrone Acetate-Ethinyl Estrad-FE (LOESTRIN 24 FE) 1-20 MG-MCG(24) tablet Take 1 tablet by mouth daily. (Patient not taking: Reported on 06/28/2014) 1 Package 11  . oxyCODONE-acetaminophen (PERCOCET/ROXICET) 5-325 MG per tablet Take 1-2 tablets by mouth every 6 (six) hours as needed. (Patient not taking: Reported on 06/28/2014) 30 tablet 0   No current facility-administered medications on file prior to visit.   Allergies  Allergen Reactions  . Other Hives and Nausea And Vomiting    Green beans  . Red Dye Nausea And Vomiting  . Sulfa Antibiotics Hives, Nausea And  Vomiting and Swelling    Childhood reaction   Family History  Problem Relation Age of Onset  . Cancer Mother     SKIN  . Hyperlipidemia Mother   . Thyroid disease Mother   . Heart disease Mother   . Asthma Brother   . Diabetes Maternal Grandmother   . Hyperlipidemia Maternal Grandmother   . Emphysema Paternal Grandfather    PE: BP 100/62 mmHg  Pulse 103  Temp(Src) 97.9 F (36.6 C) (Oral)  Resp 12  Ht 5' (1.524 m)  Wt 107 lb (48.535 kg)  BMI 20.90 kg/m2  SpO2 96%  LMP  Wt Readings from Last 3 Encounters:  06/28/14 107 lb (48.535 kg)  06/06/14 106 lb (48.081 kg)  05/16/14 108 lb (48.988 kg)   Constitutional: Normal weight, in NAD Eyes: PERRLA, EOMI, no exophthalmos ENT: moist mucous membranes, no thyromegaly, no cervical lymphadenopathy Cardiovascular: RRR, No MRG Respiratory: CTA B Gastrointestinal: abdomen soft, NT, ND, BS+ Musculoskeletal: no deformities, strength intact in all 4 Skin: moist, warm; no acne on face, no dark terminal hair on face Neurological: no tremor with outstretched hands, DTR normal in all 4  ASSESSMENT: 1. Metrorrhagia  2. Fatigue  3. Pelvic pain  PLAN: 1. Pt with h/o excessive menstrual bleeding, including between menstrual cycles. She has had investigation with the testosterone level that was normal and a prolactin level that were also normal. Patient mentions that her increased bleeding started after 6 months of using Norplant. She has had surgery but no endometriosis/fibroids were found. She was referred to me to see if there might be a hormonal cause for her increased bleeding. Of note, her bleeding stopped after her surgery, and she did not have a menstrual cycle yet. - At this visit, we discussed about possible causes for increased bleeding including PCOS, hypo/hyper thyroidism, prolactin abnormalities, use of contraceptives, Von Willebrandt disease (she was negative for this). She has a Mirena IUD placed several months ago. Her OB/GYN  doctor attempted to add Loestrin to see if this can help the bleeding, however she could not tolerate this. - At this visit, we decided to check her estrogen, LH, [place] (however, this might be influenced by her Mirena IUD). Since testosterone and prolactin were normal, I would not repeat them. I will recheck her thyroid tests including thyroid antibodies.  2. Fatigue This is a broad differential, but in this patient disconnected to the beginning of her metrorrhagia.  - I will check a vitamin B12, vitamin D, sedimentation rate, and a cortisol, along with the thyroid tests described above - Per patient request, I will add the lipid profile, since she has been advised by her ophthalmologist to have one checked  3. Pelvic pain - Her pain also started around the time of her menorrhagia - I will check a cortisol  level for the unlikely possibility of adrenal insufficiency. We'll also check an ESR. Since her mother has a history of early ovarian cancer, I will order a CEA 125 antigen, however, this is very unlikely.  I will order the following tests: Orders Placed This Encounter  Procedures  . TSH  . T4, free  . T3, free  . Thyroglobulin antibody  . Thyroid Peroxidase Antibody  . Cortisol  . Estradiol  . Luteinizing hormone  . Follicle Stimulating Hormone  . Vitamin B12  . Vit D  25 hydroxy (rtn osteoporosis monitoring)  . Sedimentation rate  . CA 125  . Lipid Profile   I will have her come back if the above tests are abnormal.  - time spent with the patient: 1 hour, of which >50% was spent in obtaining information about her symptoms, reviewing her previous labs, evaluations, and treatments, counseling her about her condition (please see the discussed topics above), and developing a plan to further investigate it. She had a number of questions which I addressed.  Office Visit on 06/28/2014  Component Date Value Ref Range Status  . TSH 06/28/2014 3.93  0.35 - 4.50 uIU/mL Final  . Free T4  06/28/2014 0.71  0.60 - 1.60 ng/dL Final  . T3, Free 06/28/2014 3.7  2.3 - 4.2 pg/mL Final  . Cortisol, Plasma 06/28/2014 8.9   Final   AM:  4.3 - 22.4 ug/dLPM:  3.1 - 16.7 ug/dL  . [place] 06/28/2014 1.07   Final   Comment: Female Reference Range:20-70 yrs     1.5-9.3 mIU/mL>70 yrs       3.1-35.6 mIU/mLFemale Reference Range:Follicular Phase     7.6-54.6 mIU/mLMidcycle             8.7-76.3 mIU/mLLuteal Phase         0.5-16.9 mIU/mL  Post Menopausal      15.9-54.0  mIU/mLPregnant             <1.5 mIU/mLContraceptives       0.7-5.6 mIU/mL   . [facility] 06/28/2014 3.0   Final   Female Reference Range:  1.4-18.1 mIU/mLFemale Reference Range:Follicular Phase          2.5-10.2 mIU/mLMidCycle Peak          3.4-33.4 mIU/mLLuteal Phase          1.5-9.1 mIU/mLPost Menopausal     23.0-116.3 mIU/mLPregnant          <0.3 mIU/mL  . Vitamin B-12 06/28/2014 403  211 - 911 pg/mL Final  . VITD 06/28/2014 18.99* 30.00 - 100.00 ng/mL Final  . Sed Rate 06/28/2014 14  0 - 22 mm/hr Final  . Cholesterol 06/28/2014 212* 0 - 200 mg/dL Final   ATP III Classification       Desirable:  < 200 mg/dL               Borderline High:  200 - 239 mg/dL          High:  > = 240 mg/dL  . Triglycerides 06/28/2014 62.0  0.0 - 149.0 mg/dL Final   Normal:  <150 mg/dLBorderline High:  150 - 199 mg/dL  . HDL 06/28/2014 32.50* >39.00 mg/dL Final  . VLDL 06/28/2014 12.4  0.0 - 40.0 mg/dL Final  . LDL Cholesterol 06/28/2014 167* 0 - 99 mg/dL Final  . Total CHOL/HDL Ratio 06/28/2014 7   Final                  Men  Women1/2 Average Risk     3.4          3.3Average Risk          5.0          4.42X Average Risk          9.6          7.13X Average Risk          15.0          11.0                      . NonHDL 06/28/2014 179.50   Final   NOTE:  Non-HDL goal should be 30 mg/dL higher than patient's LDL goal (i.e. LDL goal of < 70 mg/dL, would have non-HDL goal of < 100 mg/dL)   Msg sent: Dear Eileen [name], Not all labs are back, I will send the rest when  they are resulted. This is what I have so far: - thyroid tests normal - B12 vitamin normal - inflammation test (ESR) normal - cortisol normal - FSH and LH - will wait for estradiol to see if they are normal - lipid panel: your bad cholesterol is high, at 167, please discuss with your PCP about this. In the meantime, watch your diet and skip saturated fats and concentrated sweets. Fruit and vegetables are your friends. - your vitamin D is very low, please start Ergocalciferol 50,000 units once a week x 8 weeks (I will send the prescription to your pharmacy), then continue with 2000 units of over the counter vitamin D3 every day. Please ask your PCP to check a level when you next see them. I think you will feel better when vitamin D normalizes. Sincerely, Philemon Kingdom MD  Component     Latest Ref Rng 06/28/2014  Estradiol, Free      2.39  Estradiol      158  Thyroglobulin Ab     <2 IU/mL 4 (H)  Thyroid Peroxidase Antibody     <9 IU/mL 575 (H)  CA 125     <35 U/mL 12   Msg sent: Dear Eileen [name], You have positive thyroid antibodies, which gives you a diagnosis of Hashimoto's thyroiditis. This is the most common cause of hypothyroidism (underactive thyroid) in [place]. It is an autoimmune disorder. What this means is that you are at higher risk of developing hypothyroidism over the years. Also, patients that have positive antibodies have more hypothyroid symptoms despite normal thyroid tests. I would like to continue to follow you for this, we can schedule an appointment in another 6 months so we can recheck your thyroid tests. At that time, if you prefer, I can also check your vitamin D level to see how the supplements are working. Your estrogen level is normal. Your ovarian cancer antigen was normal too, which is a relief. Sincerely, Philemon Kingdom MD

## 2014-06-28 NOTE — Patient Instructions (Signed)
Please stop at the lab. We will schedule another appointment if the results are abnormal.

## 2014-06-29 ENCOUNTER — Telehealth: Payer: Self-pay | Admitting: Internal Medicine

## 2014-06-29 LAB — CA 125: CA 125: 12 U/mL (ref <35)

## 2014-06-29 LAB — THYROID PEROXIDASE ANTIBODY: THYROID PEROXIDASE ANTIBODY: 575 [IU]/mL — AB (ref <9)

## 2014-06-29 LAB — THYROGLOBULIN ANTIBODY: Thyroglobulin Ab: 4 IU/mL — ABNORMAL HIGH (ref <2)

## 2014-06-29 MED ORDER — VITAMIN D (ERGOCALCIFEROL) 1.25 MG (50000 UNIT) PO CAPS: 50000.0000 [IU] | ORAL_CAPSULE | ORAL | Status: DC

## 2014-06-29 NOTE — Telephone Encounter (Signed)
Please read note below and advise.  

## 2014-06-29 NOTE — Telephone Encounter (Signed)
Patient is calling for the results of her lab work °

## 2014-06-29 NOTE — Telephone Encounter (Signed)
I released what I have so far.

## 2014-06-29 NOTE — Telephone Encounter (Signed)
Called pt and advised her per the MyChart results. Pt understood.

## 2014-07-03 ENCOUNTER — Emergency Department (HOSPITAL_COMMUNITY)
Admission: EM | Admit: 2014-07-03 | Discharge: 2014-07-03 | Disposition: A | Discharge status: Home / Self Care | Payer: Medicaid Other | Source: Home / Self Care | Attending: Emergency Medicine | Admitting: Emergency Medicine

## 2014-07-03 ENCOUNTER — Emergency Department (HOSPITAL_COMMUNITY)
Admission: EM | Admit: 2014-07-03 | Discharge: 2014-07-03 | Discharge status: Absent without leave | Payer: Medicaid Other | Source: Home / Self Care

## 2014-07-03 ENCOUNTER — Encounter (HOSPITAL_COMMUNITY): Payer: Self-pay | Admitting: Emergency Medicine

## 2014-07-03 DIAGNOSIS — J020 Streptococcal pharyngitis: Secondary | ICD-10-CM

## 2014-07-03 LAB — POCT RAPID STREP A: Streptococcus, Group A Screen (Direct): POSITIVE — AB

## 2014-07-03 MED ORDER — PENICILLIN G BENZATHINE 1200000 UNIT/2ML IM SUSP
INTRAMUSCULAR | Status: AC
  Filled 2014-07-03: qty 2

## 2014-07-03 MED ORDER — PENICILLIN G BENZATHINE 1200000 UNIT/2ML IM SUSP
1.2000 10*6.[IU] | Freq: Once | INTRAMUSCULAR | Status: AC
  Administered 2014-07-03: 1.2 10*6.[IU] via INTRAMUSCULAR

## 2014-07-03 MED ORDER — AMOXICILLIN 500 MG PO CAPS: ORAL_CAPSULE | ORAL | Status: DC

## 2014-07-03 NOTE — ED Notes (Signed)
C/o sore throat.  Pain with swallowing.  Chills.  And 3 vomiting episodes.  Denies fever and diarrhea.  Pt doing salt water gargles with no relief.

## 2014-07-03 NOTE — ED Provider Notes (Signed)
CSN: [id number]     Arrival date & time 07/03/14  1618 History   First MD Initiated Contact with Patient 07/03/14 1716     Chief Complaint  Patient presents with  . Sore Throat   (Consider location/radiation/quality/duration/timing/severity/associated sxs/prior Treatment) HPI Comments: Sore thoat since last PM, swollen throat glands, chills, ears stopped up and nasal congestion.    Past Medical History  Diagnosis Date  . Dysuria   . Headache(784.0)   . Allergy   . Anemia   . Anxiety   . Hypothyroid     history of.  . Chronic kidney disease     kidney infection.  . Abortion 08/04/2011  . Mitral valve prolapse    Past Surgical History  Procedure Laterality Date  . Therapeutic abortion N/A 2012  . Laparoscopy N/A 05/25/2014    Procedure: LAPAROSCOPY DIAGNOSTIC;  Surgeon: Osborne Oman, MD;  Location: [facility];  Service: Gynecology;  Laterality: N/A;   Family History  Problem Relation Age of Onset  . Cancer Mother     SKIN  . Hyperlipidemia Mother   . Thyroid disease Mother   . Heart disease Mother   . Asthma Brother   . Diabetes Maternal Grandmother   . Hyperlipidemia Maternal Grandmother   . Emphysema Paternal Grandfather    History  Substance Use Topics  . Smoking status: Current Every Day Smoker -- 0.50 packs/day  . Smokeless tobacco: Never Used  . Alcohol Use: Yes     Comment: occasional but not while pregnant   OB History    Gravida Para Term Preterm AB TAB SAB Ectopic Multiple Living   5 3 3  0 2 2  0 0 3     Review of Systems  Constitutional: Positive for chills and activity change. Negative for fever.  HENT: Positive for congestion, ear pain and sore throat. Negative for ear discharge and hearing loss.   Eyes: Negative for visual disturbance.  Respiratory: Negative for cough and shortness of breath.   Cardiovascular: Negative for chest pain.  Gastrointestinal: Negative.   Neurological: Negative.     Allergies  Other; Red dye; and Sulfa  antibiotics  Home Medications   Prior to Admission medications   Medication Sig Start Date End Date Taking? Authorizing Provider  gabapentin (NEURONTIN) 300 MG capsule Take 1 capsule (300 mg total) by mouth 3 (three) times daily. 06/06/14  Yes Osborne Oman, MD  Norethindrone Acetate-Ethinyl Estrad-FE (LOESTRIN 24 FE) 1-20 MG-MCG(24) tablet Take 1 tablet by mouth daily. 06/06/14  Yes Osborne Oman, MD  amoxicillin (AMOXIL) 500 MG capsule One cap po qid for 7 d 07/03/14   Janne Napoleon, NP  ibuprofen (ADVIL,MOTRIN) 600 MG tablet Take 1 tablet (600 mg total) by mouth every 6 (six) hours as needed. 05/25/14   Osborne Oman, MD  levonorgestrel (MIRENA) 20 MCG/24HR IUD 1 each by Intrauterine route once.    Historical Provider, MD  Vitamin D, Ergocalciferol, (DRISDOL) 50000 UNITS CAPS capsule Take 1 capsule (50,000 Units total) by mouth every 7 (seven) days. 06/29/14   Philemon Kingdom, MD   BP 96/61 mmHg  Pulse 101  Temp(Src) 98.7 F (37.1 C) (Oral)  Resp 12  SpO2 99%  LMP 06/25/2014 Physical Exam  Constitutional: She appears well-developed and well-nourished. No distress.  HENT:  Mouth/Throat: Oropharyngeal exudate present.  Bilat TM's nl OP with bilateral enlarged tonsils, red with lg gray exudates.  Airway widely patent.  Eyes: Conjunctivae and EOM are normal.  Neck: Normal range of motion. Neck  supple.  Cardiovascular: Normal rate, regular rhythm, normal heart sounds and intact distal pulses.   Pulmonary/Chest: Effort normal and breath sounds normal. No respiratory distress. She has no wheezes. She has no rales.  Lymphadenopathy:    She has cervical adenopathy.  Neurological: She is alert.  Skin: Skin is warm and dry. No rash noted.  Psychiatric: She has a normal mood and affect.  Nursing note and vitals reviewed.   ED Course  Procedures (including critical care time) Labs Review Labs Reviewed  POCT RAPID STREP A ([facility] CARE ONLY) - Abnormal; Notable for the following:     Streptococcus, Group A Screen (Direct) POSITIVE (*)    All other components within normal limits    Imaging Review No results found.   MDM   1. Strep pharyngitis    Ibuprofen or tylenol Cepacol lozenges for pain Lots of water Pt declined po amoxil, requests Bicillin LA 1.2 mil units IM now    Janne Napoleon, NP 07/03/14 [employer], NP 07/03/14 1749

## 2014-07-03 NOTE — Discharge Instructions (Signed)
Strep Throat Ibuprofen or tylenol Cepacol lozenges for pain Lots of water Strep throat is an infection of the throat caused by a bacteria named Streptococcus pyogenes. Your health care provider may call the infection streptococcal "tonsillitis" or "pharyngitis" depending on whether there are signs of inflammation in the tonsils or back of the throat. Strep throat is most common in children aged 23-15 years during the cold months of the year, but it can occur in people of any age during any season. This infection is spread from person to person (contagious) through coughing, sneezing, or other close contact. SIGNS AND SYMPTOMS   Fever or chills.  Painful, swollen, red tonsils or throat.  Pain or difficulty when swallowing.  White or yellow spots on the tonsils or throat.  Swollen, tender lymph nodes or "glands" of the neck or under the jaw.  Red rash all over the body (rare). DIAGNOSIS  Many different infections can cause the same symptoms. A test must be done to confirm the diagnosis so the right treatment can be given. A "rapid strep test" can help your health care provider make the diagnosis in a few minutes. If this test is not available, a light swab of the infected area can be used for a throat culture test. If a throat culture test is done, results are usually available in a day or two. TREATMENT  Strep throat is treated with antibiotic medicine. HOME CARE INSTRUCTIONS   Gargle with 1 tsp of salt in 1 cup of warm water, 3-4 times per day or as needed for comfort.  Family members who also have a sore throat or fever should be tested for strep throat and treated with antibiotics if they have the strep infection.  Make sure everyone in your household washes their hands well.  Do not share food, drinking cups, or personal items that could cause the infection to spread to others.  You may need to eat a soft food diet until your sore throat gets better.  Drink enough water and fluids  to keep your urine clear or pale yellow. This will help prevent dehydration.  Get plenty of rest.  Stay home from school, day care, or work until you have been on antibiotics for 24 hours.  Take medicines only as directed by your health care provider.  Take your antibiotic medicine as directed by your health care provider. Finish it even if you start to feel better. SEEK MEDICAL CARE IF:   The glands in your neck continue to enlarge.  You develop a rash, cough, or earache.  You cough up green, yellow-brown, or bloody sputum.  You have pain or discomfort not controlled by medicines.  Your problems seem to be getting worse rather than better.  You have a fever. SEEK IMMEDIATE MEDICAL CARE IF:   You develop any new symptoms such as vomiting, severe headache, stiff or painful neck, chest pain, shortness of breath, or trouble swallowing.  You develop severe throat pain, drooling, or changes in your voice.  You develop swelling of the neck, or the skin on the neck becomes red and tender.  You develop signs of dehydration, such as fatigue, dry mouth, and decreased urination.  You become increasingly sleepy, or you cannot wake up completely. MAKE SURE YOU:  Understand these instructions.  Will watch your condition.  Will get help right away if you are not doing well or get worse. Document Released: 07/10/2000 Document Revised: 11/27/2013 Document Reviewed: 09/11/2010 [facility] Patient Information 2015 [place], [place]. This information  is not intended to replace advice given to you by your health care provider. Make sure you discuss any questions you have with your health care provider.  Sore Throat A sore throat is a painful, burning, sore, or scratchy feeling of the throat. There may be pain or tenderness when swallowing or talking. You may have other symptoms with a sore throat. These include coughing, sneezing, fever, or a swollen neck. A sore throat is often the first sign of  another sickness. These sicknesses may include a cold, flu, strep throat, or an infection called mono. Most sore throats go away without medical treatment.  HOME CARE   Only take medicine as told by your doctor.  Drink enough fluids to keep your pee (urine) clear or pale yellow.  Rest as needed.  Try using throat sprays, lozenges, or suck on hard candy (if older than 4 years or as told).  Sip warm liquids, such as broth, herbal tea, or warm water with honey. Try sucking on frozen ice pops or drinking cold liquids.  Rinse the mouth (gargle) with salt water. Mix 1 teaspoon salt with 8 ounces of water.  Do not smoke. Avoid being around others when they are smoking.  Put a humidifier in your bedroom at night to moisten the air. You can also turn on a hot shower and sit in the bathroom for 5-10 minutes. Be sure the bathroom door is closed. GET HELP RIGHT AWAY IF:   You have trouble breathing.  You cannot swallow fluids, soft foods, or your spit (saliva).  You have more puffiness (swelling) in the throat.  Your sore throat does not get better in 7 days.  You feel sick to your stomach (nauseous) and throw up (vomit).  You have a fever or lasting symptoms for more than 2-3 days.  You have a fever and your symptoms suddenly get worse. MAKE SURE YOU:   Understand these instructions.  Will watch your condition.  Will get help right away if you are not doing well or get worse. Document Released: 04/21/2008 Document Revised: 04/06/2012 Document Reviewed: 03/20/2012 [facility] Patient Information 2015 [place], [place]. This information is not intended to replace advice given to you by your health care provider. Make sure you discuss any questions you have with your health care provider.

## 2014-07-04 LAB — ESTRADIOL, FREE
ESTRADIOL: 158 pg/mL
Estradiol, Free: 2.39 pg/mL

## 2014-08-06 ENCOUNTER — Other Ambulatory Visit: Payer: Self-pay | Admitting: Internal Medicine

## 2014-08-06 ENCOUNTER — Telehealth: Payer: Self-pay | Admitting: *Deleted

## 2014-08-06 ENCOUNTER — Telehealth: Payer: Self-pay | Admitting: Internal Medicine

## 2014-08-06 DIAGNOSIS — Z7689 Persons encountering health services in other specified circumstances: Secondary | ICD-10-CM

## 2014-08-06 MED ORDER — VITAMIN D (ERGOCALCIFEROL) 1.25 MG (50000 UNIT) PO CAPS: 50000.0000 [IU] | ORAL_CAPSULE | ORAL | Status: DC

## 2014-08-06 NOTE — Telephone Encounter (Signed)
I see. Well, then, she needs to see that person.

## 2014-08-06 NOTE — Telephone Encounter (Signed)
Please read note below and advise.  

## 2014-08-06 NOTE — Telephone Encounter (Signed)
Pt never received the rx for the Vit D. She also, did not ever call us and let us know that the pharmacy did not have it. Resent to pt's pharmacy. Pt said that she does not have a PCP, what should she do about her cholesterol? Also, she is losing weight drastically. According to her scales she has lost 7 lbs in a matter of a few days. Pt to send Dr Cruzita Lederer a message via [place] as well. Be advised.

## 2014-08-06 NOTE — Telephone Encounter (Signed)
Pt needs vitamin d supplement [facility]

## 2014-08-06 NOTE — Telephone Encounter (Signed)
I called this in 1 mo ago. After the 8 weeks dose, she needs to start OTC vit D 2000 IU daily as advised in my message to her from 06/2014.

## 2014-08-06 NOTE — Telephone Encounter (Signed)
Stanton Kidney called stating that this pt is [place] Access-[employer]. She is not allowed to do referrals for [place]-Access Medicaid pts. They must see the physician that is listed on their card as their PCP. Be advised.

## 2014-08-06 NOTE — Addendum Note (Signed)
Addended by: Rockie Neighbours B on: 08/06/2014 02:17 PM   Modules accepted: Orders

## 2014-08-07 NOTE — Telephone Encounter (Signed)
Called pt and unable to reach her or lvm. Will call back.

## 2014-08-16 ENCOUNTER — Ambulatory Visit (INDEPENDENT_AMBULATORY_CARE_PROVIDER_SITE_OTHER): Payer: Medicaid Other | Admitting: Obstetrics & Gynecology

## 2014-08-16 ENCOUNTER — Encounter: Payer: Self-pay | Admitting: Obstetrics & Gynecology

## 2014-08-16 VITALS — BP 111/71 | HR 130 | Ht 60.0 in | Wt 104.0 lb

## 2014-08-16 DIAGNOSIS — Z30013 Encounter for initial prescription of injectable contraceptive: Secondary | ICD-10-CM

## 2014-08-16 DIAGNOSIS — Z3042 Encounter for surveillance of injectable contraceptive: Secondary | ICD-10-CM

## 2014-08-16 DIAGNOSIS — F329 Major depressive disorder, single episode, unspecified: Secondary | ICD-10-CM

## 2014-08-16 DIAGNOSIS — F32A Depression, unspecified: Secondary | ICD-10-CM

## 2014-08-16 DIAGNOSIS — Z30432 Encounter for removal of intrauterine contraceptive device: Secondary | ICD-10-CM

## 2014-08-16 MED ORDER — MEDROXYPROGESTERONE ACETATE 150 MG/ML IM SUSP
150.0000 mg | INTRAMUSCULAR | Status: DC
  Administered 2014-08-16: 150 mg via INTRAMUSCULAR

## 2014-08-16 NOTE — Progress Notes (Signed)
Patient would like IUD removed, she continues to have pelvic pain and irregular bleeding.  She would like to try Depo Provera.

## 2014-08-16 NOTE — Patient Instructions (Signed)
Return to clinic for any scheduled appointments or for any gynecologic concerns as needed.   

## 2014-08-16 NOTE — Progress Notes (Signed)
    GYNECOLOGY CLINIC PROCEDURE NOTE  Eileen [name] is a 24 y.o. [phone] here for Mirena IUD removal. No GYN concerns.  Last pap smear was on 01/09/2014 and was normal.  Patient has had multiple other visits for chronic pelvic pain, no endometriosis seen on laparoscopy. Also has AUB, Mirena and Nexplanon did not help with this.  She wants to try Depo Provera.   IUD Removal  Patient identified, informed consent performed, consent signed.  Patient was in the dorsal lithotomy position, normal external genitalia was noted.  A speculum was placed in the patient's vagina, normal discharge was noted, no lesions. The cervix was visualized, no lesions, no abnormal discharge. The strings of the IUD were not visualized, so [name] were introduced into the endometrial cavity after prepping the cervix with Betadine,  and the IUD was grasped and removed in its entirety.  Patient tolerated the procedure well.    Patient will use Depo Provera for contraception, injection given today.  Routine preventative health maintenance measures emphasized. Bleeding and pain precautions reviewed.    Verita Schneiders, MD, [place] Attending [place] for [employer], [place]

## 2014-10-22 ENCOUNTER — Telehealth: Payer: Self-pay | Admitting: *Deleted

## 2014-10-22 DIAGNOSIS — N390 Urinary tract infection, site not specified: Secondary | ICD-10-CM

## 2014-10-22 MED ORDER — CIPROFLOXACIN HCL 500 MG PO TABS: 500.0000 mg | ORAL_TABLET | Freq: Two times a day (BID) | ORAL | Status: DC

## 2014-10-22 NOTE — Telephone Encounter (Signed)
Pt called and needed something called in for a UTI. I have sent in Cipro to patients pharmacy.  Pt aware.

## 2014-11-04 ENCOUNTER — Emergency Department: Admit: 2014-11-04 | Discharge status: Against Medical Advice | Payer: Self-pay | Admitting: Physician Assistant

## 2014-11-04 LAB — CBC WITH DIFFERENTIAL/PLATELET
BASOS ABS: 0.1 10*3/uL (ref 0.0–0.1)
Basophil %: 0.7 %
Eosinophil #: 0.2 10*3/uL (ref 0.0–0.7)
Eosinophil %: 3 %
HCT: 38.4 % (ref 35.0–47.0)
HGB: 12.2 g/dL (ref 12.0–16.0)
Lymphocyte #: 3 10*3/uL (ref 1.0–3.6)
Lymphocyte %: 42.2 %
MCH: 25.8 pg — ABNORMAL LOW (ref 26.0–34.0)
MCHC: 31.8 g/dL — ABNORMAL LOW (ref 32.0–36.0)
MCV: 81 fL (ref 80–100)
MONOS PCT: 5.7 %
Monocyte #: 0.4 x10 3/mm (ref 0.2–0.9)
NEUTROS ABS: 3.5 10*3/uL (ref 1.4–6.5)
Neutrophil %: 48.4 %
Platelet: 184 10*3/uL (ref 150–440)
RBC: 4.74 10*6/uL (ref 3.80–5.20)
RDW: 16.5 % — AB (ref 11.5–14.5)
WBC: 7.2 10*3/uL (ref 3.6–11.0)

## 2014-11-04 LAB — BASIC METABOLIC PANEL
Anion Gap: 8 (ref 7–16)
BUN: 12 mg/dL
CALCIUM: 9.6 mg/dL
CO2: 26 mmol/L
CREATININE: 0.72 mg/dL
Chloride: 103 mmol/L
EGFR (African American): >60
GLUCOSE: 98 mg/dL
Potassium: 4.2 mmol/L
Sodium: 137 mmol/L

## 2014-11-04 LAB — URINALYSIS, COMPLETE
Bilirubin,UR: NEGATIVE
Blood: NEGATIVE
Glucose,UR: NEGATIVE mg/dL (ref 0–75)
Ketone: NEGATIVE
Leukocyte Esterase: NEGATIVE
Nitrite: NEGATIVE
Ph: 7 (ref 4.5–8.0)
Protein: NEGATIVE
RBC, UR: NONE SEEN /HPF (ref 0–5)
Specific Gravity: 1.005 (ref 1.003–1.030)

## 2014-11-04 LAB — WET PREP, GENITAL

## 2014-11-04 LAB — PREGNANCY, URINE: Pregnancy Test, Urine: NEGATIVE m[IU]/mL

## 2014-11-04 LAB — GC/CHLAMYDIA PROBE AMP

## 2014-11-15 ENCOUNTER — Ambulatory Visit: Payer: Medicaid Other

## 2014-11-28 ENCOUNTER — Encounter (HOSPITAL_COMMUNITY): Payer: Self-pay | Admitting: *Deleted

## 2014-11-28 ENCOUNTER — Emergency Department (HOSPITAL_COMMUNITY)
Admission: EM | Admit: 2014-11-28 | Discharge: 2014-11-29 | Disposition: A | Discharge status: Home / Self Care | Payer: Medicaid Other | Attending: Emergency Medicine | Admitting: Emergency Medicine

## 2014-11-28 DIAGNOSIS — N189 Chronic kidney disease, unspecified: Secondary | ICD-10-CM | POA: Insufficient documentation

## 2014-11-28 DIAGNOSIS — Z79899 Other long term (current) drug therapy: Secondary | ICD-10-CM | POA: Diagnosis not present

## 2014-11-28 DIAGNOSIS — F419 Anxiety disorder, unspecified: Secondary | ICD-10-CM | POA: Diagnosis not present

## 2014-11-28 DIAGNOSIS — Z792 Long term (current) use of antibiotics: Secondary | ICD-10-CM | POA: Diagnosis not present

## 2014-11-28 DIAGNOSIS — Z720 Tobacco use: Secondary | ICD-10-CM | POA: Diagnosis not present

## 2014-11-28 DIAGNOSIS — Z862 Personal history of diseases of the blood and blood-forming organs and certain disorders involving the immune mechanism: Secondary | ICD-10-CM | POA: Diagnosis not present

## 2014-11-28 DIAGNOSIS — R000 Tachycardia, unspecified: Secondary | ICD-10-CM | POA: Insufficient documentation

## 2014-11-28 DIAGNOSIS — J069 Acute upper respiratory infection, unspecified: Secondary | ICD-10-CM

## 2014-11-28 DIAGNOSIS — Z3202 Encounter for pregnancy test, result negative: Secondary | ICD-10-CM | POA: Diagnosis not present

## 2014-11-28 DIAGNOSIS — R05 Cough: Secondary | ICD-10-CM | POA: Diagnosis present

## 2014-11-28 DIAGNOSIS — Z8639 Personal history of other endocrine, nutritional and metabolic disease: Secondary | ICD-10-CM | POA: Diagnosis not present

## 2014-11-28 DIAGNOSIS — R059 Cough, unspecified: Secondary | ICD-10-CM

## 2014-11-28 NOTE — ED Notes (Signed)
Pt reports she smoked a black and mild and has had a cough since then. Reports kids also have a cough. Pt reports no relief with OTC meds. Pt also reports she thought it was getting better but now getting worse again.

## 2014-11-28 NOTE — ED Notes (Signed)
The pt has had a cough for the past 3 days since she smoked a black and mild.  No temp no pain.  Dry cough  lmp now

## 2014-11-28 NOTE — ED Provider Notes (Signed)
CSN: [id number]     Arrival date & time 11/28/14  2215 History  This chart was scribed for [employer], PA-C, working with Milton Ferguson, MD by Steva Colder, ED Scribe. The patient was seen in room TR05C/TR05C at 10:47 PM.    Chief Complaint  Patient presents with  . Cough      Patient is a 24 y.o. female presenting with cough. The history is provided by the patient. No language interpreter was used.  Cough Cough characteristics:  Productive Sputum characteristics:  Green Severity:  Moderate Onset quality:  Gradual Duration:  1 week Timing:  Constant Progression:  Worsening Chronicity:  New Smoker: yes   Context: sick contacts   Relieved by:  Nothing Worsened by:  Nothing tried Ineffective treatments: OTC medications. Associated symptoms: rhinorrhea   Associated symptoms: no chest pain, no chills, no ear pain, no fever, no myalgias, no shortness of breath, no sore throat and no wheezing     Eileen [name] is a 24 y.o. female with a medical hx of HA who presents to the Emergency department complaining of productive cough onset 7 days. Her cough is productive of green sputum. Pt reports that her daughter was sick and that she thinks that she contracted these symptoms from her daughter. Pt reports that her daughter was treated for her symptoms. Pt notes that her symptoms went away and then came back and are worsening. She states that she has tried dayquil, tylenol, tylenol cold and flu, Claritin, and mucinex with no relief for her symptoms.   She states that she is having associated symptoms of green rhinorrhea.  She denies fever, chills, CP, SOB, wheezing, ear pain, drainage, sore throat, trouble swallowing, drooling, trismus, neck pain or swelling, abdominal pain, n/v/d, blood in stool, numbness, tingling, weakness, vaginal bleeding, vaginal discharge, hematuria, dysuria, and any other symptoms. Pt notes that she has had sick contacts with her daughter. Pt has not smoked much  lately but she is a smoker. Pt denies seasonal allergies.   Of note, pt is not sure of her last period and she has concern for if she is pregnant. Pt was on the depo shot and hasn't been on it for awhile. Pt has not been using protection but has been using the "pull out" method.    Past Medical History  Diagnosis Date  . Dysuria   . Headache(784.0)   . Allergy   . Anemia   . Anxiety   . Hypothyroid     history of.  . Chronic kidney disease     kidney infection.  . Abortion 08/04/2011  . Mitral valve prolapse    Past Surgical History  Procedure Laterality Date  . Therapeutic abortion N/A 2012  . Laparoscopy N/A 05/25/2014    Procedure: LAPAROSCOPY DIAGNOSTIC;  Surgeon: Osborne Oman, MD;  Location: [facility];  Service: Gynecology;  Laterality: N/A;   Family History  Problem Relation Age of Onset  . Cancer Mother     SKIN  . Hyperlipidemia Mother   . Thyroid disease Mother   . Heart disease Mother   . Asthma Brother   . Diabetes Maternal Grandmother   . Hyperlipidemia Maternal Grandmother   . Emphysema Paternal Grandfather    History  Substance Use Topics  . Smoking status: Current Every Day Smoker -- 0.50 packs/day  . Smokeless tobacco: Never Used  . Alcohol Use: 0.0 oz/week    0 Standard drinks or equivalent per week     Comment: occasional but not while  pregnant   OB History    Gravida Para Term Preterm AB TAB SAB Ectopic Multiple Living   5 3 3  0 2 2  0 0 3     Review of Systems  Constitutional: Negative for fever and chills.  HENT: Positive for rhinorrhea. Negative for congestion, drooling, ear discharge, ear pain, sore throat and trouble swallowing.   Respiratory: Positive for cough. Negative for shortness of breath and wheezing.   Cardiovascular: Negative for chest pain.  Gastrointestinal: Negative for nausea, vomiting, abdominal pain and diarrhea.  Genitourinary: Negative for dysuria, hematuria, vaginal bleeding and vaginal discharge.  Musculoskeletal:  Negative for myalgias, joint swelling and neck pain.  Allergic/Immunologic: Negative for environmental allergies and immunocompromised state.  Neurological: Negative for weakness and numbness.  Hematological: Negative for adenopathy.    A complete 10 system review of systems was obtained and all systems are negative except as noted in the HPI and PMH.    Allergies  Other; Red dye; and Sulfa antibiotics  Home Medications   Prior to Admission medications   Medication Sig Start Date End Date Taking? Authorizing Provider  ciprofloxacin (CIPRO) 500 MG tablet Take 1 tablet (500 mg total) by mouth 2 (two) times daily. 10/22/14   Donnamae Jude, MD  ibuprofen (ADVIL,MOTRIN) 600 MG tablet Take 1 tablet (600 mg total) by mouth every 6 (six) hours as needed. 05/25/14   Osborne Oman, MD  levonorgestrel (MIRENA) 20 MCG/24HR IUD 1 each by Intrauterine route once.    Historical Provider, MD  sertraline (ZOLOFT) 50 MG tablet Take 50 mg by mouth daily.    Historical Provider, MD  Vitamin D, Ergocalciferol, (DRISDOL) 50000 UNITS CAPS capsule Take 1 capsule (50,000 Units total) by mouth every 7 (seven) days. 08/06/14   Philemon Kingdom, MD   BP 116/77 mmHg  Pulse 105  Temp(Src) 98.3 F (36.8 C) (Oral)  Resp 22  SpO2 99%  LMP 11/28/2014 Physical Exam  Constitutional: She is oriented to person, place, and time. Vital signs are normal. She appears well-developed and well-nourished.  Non-toxic appearance. No distress.  Afebrile, nontoxic, NAD  HENT:  Head: Normocephalic and atraumatic.  Right Ear: Hearing, tympanic membrane, external ear and ear canal normal.  Left Ear: Hearing, tympanic membrane, external ear and ear canal normal.  Nose: Nose normal.  Mouth/Throat: Uvula is midline, oropharynx is clear and moist and mucous membranes are normal. No trismus in the jaw. No uvula swelling. No oropharyngeal exudate or posterior oropharyngeal erythema.  Ears clear bilaterally. Nose clear. oropharynx clear  and moist without tonsillar swelling, exudate, or erythema. Uvula midline without swelling, no trismus, or drooling.   Eyes: Conjunctivae and EOM are normal. Right eye exhibits no discharge. Left eye exhibits no discharge.  Neck: Normal range of motion. Neck supple.  Cardiovascular: Regular rhythm, normal heart sounds and intact distal pulses.  Tachycardia present.  Exam reveals no gallop and no friction rub.   No murmur heard. Mild tachycardia due to coughing, which improved during exam. Regular rhythm, nl s1/s2, no m/r/g, distal pulses intact.   Pulmonary/Chest: Effort normal and breath sounds normal. No respiratory distress. She has no decreased breath sounds. She has no wheezes. She has no rhonchi. She has no rales.  CTAB in all lung fields, no w/r/r, no hypoxia or increased WOB, speaking in full sentences, SpO2 99% on RA. Intermittent cough noted upon exam.   Abdominal: Soft. Normal appearance and bowel sounds are normal. She exhibits no distension. There is no tenderness. There is no  rigidity, no rebound and no guarding.  Musculoskeletal: Normal range of motion.  Lymphadenopathy:       Head (right side): No submandibular and no tonsillar adenopathy present.       Head (left side): No submandibular and no tonsillar adenopathy present.    She has cervical adenopathy.  Shotty anterior cervical LAD which is non-tender. No submandibular or tonsillar LAD.  Neurological: She is alert and oriented to person, place, and time. She has normal strength. No sensory deficit.  Skin: Skin is warm, dry and intact. No rash noted.  Psychiatric: She has a normal mood and affect.  Nursing note and vitals reviewed.   ED Course  Procedures (including critical care time) DIAGNOSTIC STUDIES: Oxygen Saturation is 99% on RA, nl by my interpretation.    COORDINATION OF CARE: 10:57 PM-Discussed treatment plan which includes POC UA Preg with pt at bedside and pt agreed to plan.   Labs Review Labs Reviewed   POC URINE PREG, ED    Imaging Review No results found.   EKG Interpretation None      MDM   Final diagnoses:  Cough  URI (upper respiratory infection)    24 y.o. female here with URI symptoms, initially got better then worsened. +sick contacts at home. Clear lung exam. Given hx of it getting worse after initially improving, will treat as bacterial infection. Doubt need for imaging since pt isn't wheezing and has clear lung exam, afebrile and nontoxic. No asthma or COPD, doubt need for prednisone or nebs. Pt states she's concerned she may be pregnant because she can't recall her LMP, will get upreg before deciding on abx treatment. No other complaints at this time. Doubt need for strep testing or mono testing. Will reassess after Upreg.  12:02 AM upreg neg. Will proceed with augmentin and symptomatic control. Will have her f/up with [place] in 1wk. I explained the diagnosis and have given explicit precautions to return to the ER including for any other new or worsening symptoms. The patient understands and accepts the medical plan as it's been dictated and I have answered their questions. Discharge instructions concerning home care and prescriptions have been given. The patient is STABLE and is discharged to home in good condition.   I personally performed the services described in this documentation, which was scribed in my presence. The recorded information has been reviewed and is accurate.   BP 116/77 mmHg  Pulse 105  Temp(Src) 98.3 F (36.8 C) (Oral)  Resp 22  SpO2 99%  LMP 11/28/2014  Meds ordered this encounter  Medications  . amoxicillin-clavulanate (AUGMENTIN) 875-125 MG per tablet    Sig: Take 1 tablet by mouth 2 (two) times daily. One po bid x 7 days    Dispense:  14 tablet    Refill:  0    Order Specific Question:  Supervising Provider    Answer:  Noemi Chapel [[id number]]      Zenya Hickam [place]pr[place], PA-C 11/29/14 0003  Milton Ferguson, MD 11/29/14 1322

## 2014-11-29 LAB — POC URINE PREG, ED: Preg Test, Ur: NEGATIVE

## 2014-11-29 MED ORDER — AMOXICILLIN-POT CLAVULANATE 875-125 MG PO TABS: 1.0000 | ORAL_TABLET | Freq: Two times a day (BID) | ORAL | Status: DC

## 2014-11-29 NOTE — Discharge Instructions (Signed)
Continue to stay well-hydrated. Gargle warm salt water and spit it out. Continue to alternate between Tylenol and Ibuprofen for pain or fever. Use Mucinex for cough suppression/expectoration of mucus. Use netipot and flonase to help with nasal congestion. May consider over-the-counter Benadryl or other antihistamine to decrease secretions and for watery itchy eyes. Take antibiotic as directed. Followup with  and wellness in 5-7 days for recheck of ongoing symptoms. Return to emergency department for emergent changing or worsening of symptoms.   Cough, Adult  A cough is a reflex. It helps you clear your throat and airways. A cough can help heal your body. A cough can last 2 or 3 weeks (acute) or may last more than 8 weeks (chronic). Some common causes of a cough can include an infection, allergy, or a cold. HOME CARE  Only take medicine as told by your doctor.  If given, take your medicines (antibiotics) as told. Finish them even if you start to feel better.  Use a cold steam vaporizer or humidifier in your home. This can help loosen thick spit (secretions).  Sleep so you are almost sitting up (semi-upright). Use pillows to do this. This helps reduce coughing.  Rest as needed.  Stop smoking if you smoke. GET HELP RIGHT AWAY IF:  You have yellowish-white fluid (pus) in your thick spit.  Your cough gets worse.  Your medicine does not reduce coughing, and you are losing sleep.  You cough up blood.  You have trouble breathing.  Your pain gets worse and medicine does not help.  You have a fever. MAKE SURE YOU:   Understand these instructions.  Will watch your condition.  Will get help right away if you are not doing well or get worse. Document Released: 03/26/2011 Document Revised: 11/27/2013 Document Reviewed: 03/26/2011 [facility] Patient Information 2015 [place], [place]. This information is not intended to replace advice given to you by your health care provider. Make  sure you discuss any questions you have with your health care provider.  Upper Respiratory Infection, Adult An upper respiratory infection (URI) is also known as the common cold. It is often caused by a type of germ (virus). Colds are easily spread (contagious). You can pass it to others by kissing, coughing, sneezing, or drinking out of the same glass. Usually, you get better in 1 or 2 weeks.  HOME CARE   Only take medicine as told by your doctor.  Use a warm mist humidifier or breathe in steam from a hot shower.  Drink enough water and fluids to keep your pee (urine) clear or pale yellow.  Get plenty of rest.  Return to work when your temperature is back to normal or as told by your doctor. You may use a face mask and wash your hands to stop your cold from spreading. GET HELP RIGHT AWAY IF:   After the first few days, you feel you are getting worse.  You have questions about your medicine.  You have chills, shortness of breath, or brown or red spit (mucus).  You have yellow or brown snot (nasal discharge) or pain in the face, especially when you bend forward.  You have a fever, puffy (swollen) neck, pain when you swallow, or white spots in the back of your throat.  You have a bad headache, ear pain, sinus pain, or chest pain.  You have a high-pitched whistling sound when you breathe in and out (wheezing).  You have a lasting cough or cough up blood.  You have  sore muscles or a stiff neck. MAKE SURE YOU:   Understand these instructions.  Will watch your condition.  Will get help right away if you are not doing well or get worse. Document Released: 12/30/2007 Document Revised: 10/05/2011 Document Reviewed: 10/18/2013 [facility] Patient Information 2015 [place], [place]. This information is not intended to replace advice given to you by your health care provider. Make sure you discuss any questions you have with your health care provider.

## 2015-04-18 ENCOUNTER — Telehealth: Payer: Self-pay | Admitting: Obstetrics & Gynecology

## 2015-04-18 NOTE — Telephone Encounter (Signed)
Pt is currently taking Gabapentin for her Fibromyalgia and was wondering if she could switch to Lyrica because the other doesn't do anything for her. Please advise.

## 2015-04-18 NOTE — Telephone Encounter (Signed)
Spoke to pt, was rx Neurontin 05/2014 for chronic pelvic pain, pt was then seen my an Internal Medicine physician which discussed Fibromyalgia with her.  Has not had good relief and was wondering if she could have a medication change, continues to have pain in her pelvis and down in her legs as well.  Pt has [employer] and is limited to physicians to use.  Told pt I would discuss her case with Dr Harolyn Rutherford in the morning and would call her at that time.

## 2015-04-19 ENCOUNTER — Telehealth: Payer: Self-pay | Admitting: *Deleted

## 2015-04-19 DIAGNOSIS — G8929 Other chronic pain: Secondary | ICD-10-CM

## 2015-04-19 DIAGNOSIS — R102 Pelvic and perineal pain: Principal | ICD-10-CM

## 2015-04-19 MED ORDER — GABAPENTIN 300 MG PO CAPS: 300.0000 mg | ORAL_CAPSULE | Freq: Three times a day (TID) | ORAL | Status: DC

## 2015-04-19 NOTE — Telephone Encounter (Signed)
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  Gave her several clinics to call that would accept [employer].  Called in refill for the Neurontin per Dr Harolyn Rutherford order to help with the pain until she was able to be seen by internal medicine physician.

## 2015-04-19 NOTE — Telephone Encounter (Signed)
Return call to follow-up from yesterday's request for a medication change.  Called pt, no answer, left message to call office.

## 2015-04-27 ENCOUNTER — Emergency Department (HOSPITAL_COMMUNITY): Payer: No Typology Code available for payment source

## 2015-04-27 ENCOUNTER — Encounter (HOSPITAL_COMMUNITY): Payer: Self-pay

## 2015-04-27 ENCOUNTER — Emergency Department (HOSPITAL_COMMUNITY)
Admission: EM | Admit: 2015-04-27 | Discharge: 2015-04-27 | Disposition: A | Discharge status: Home / Self Care | Payer: No Typology Code available for payment source | Attending: Physician Assistant | Admitting: Physician Assistant

## 2015-04-27 DIAGNOSIS — N189 Chronic kidney disease, unspecified: Secondary | ICD-10-CM | POA: Diagnosis not present

## 2015-04-27 DIAGNOSIS — Z79899 Other long term (current) drug therapy: Secondary | ICD-10-CM | POA: Diagnosis not present

## 2015-04-27 DIAGNOSIS — Z8639 Personal history of other endocrine, nutritional and metabolic disease: Secondary | ICD-10-CM | POA: Insufficient documentation

## 2015-04-27 DIAGNOSIS — S82891A Other fracture of right lower leg, initial encounter for closed fracture: Secondary | ICD-10-CM | POA: Diagnosis not present

## 2015-04-27 DIAGNOSIS — Y92410 Unspecified street and highway as the place of occurrence of the external cause: Secondary | ICD-10-CM | POA: Insufficient documentation

## 2015-04-27 DIAGNOSIS — T1490XA Injury, unspecified, initial encounter: Secondary | ICD-10-CM

## 2015-04-27 DIAGNOSIS — Z720 Tobacco use: Secondary | ICD-10-CM | POA: Diagnosis not present

## 2015-04-27 DIAGNOSIS — Y9389 Activity, other specified: Secondary | ICD-10-CM | POA: Diagnosis not present

## 2015-04-27 DIAGNOSIS — S99911A Unspecified injury of right ankle, initial encounter: Secondary | ICD-10-CM | POA: Diagnosis present

## 2015-04-27 DIAGNOSIS — Z862 Personal history of diseases of the blood and blood-forming organs and certain disorders involving the immune mechanism: Secondary | ICD-10-CM | POA: Diagnosis not present

## 2015-04-27 DIAGNOSIS — Y998 Other external cause status: Secondary | ICD-10-CM | POA: Insufficient documentation

## 2015-04-27 DIAGNOSIS — F419 Anxiety disorder, unspecified: Secondary | ICD-10-CM | POA: Diagnosis not present

## 2015-04-27 DIAGNOSIS — S199XXA Unspecified injury of neck, initial encounter: Secondary | ICD-10-CM | POA: Diagnosis not present

## 2015-04-27 LAB — COMPREHENSIVE METABOLIC PANEL
ALBUMIN: 4.4 g/dL (ref 3.5–5.0)
ALT: 28 U/L (ref 14–54)
AST: 43 U/L — AB (ref 15–41)
Alkaline Phosphatase: 53 U/L (ref 38–126)
Anion gap: 9 (ref 5–15)
BUN: 12 mg/dL (ref 6–20)
CHLORIDE: 107 mmol/L (ref 101–111)
CO2: 22 mmol/L (ref 22–32)
Calcium: 9.7 mg/dL (ref 8.9–10.3)
Creatinine, Ser: 0.75 mg/dL (ref 0.44–1.00)
GFR calc Af Amer: >60 mL/min (ref >60)
Glucose, Bld: 92 mg/dL (ref 65–99)
POTASSIUM: 4 mmol/L (ref 3.5–5.1)
SODIUM: 138 mmol/L (ref 135–145)
Total Bilirubin: 0.7 mg/dL (ref 0.3–1.2)
Total Protein: 7.2 g/dL (ref 6.5–8.1)

## 2015-04-27 LAB — CBC WITH DIFFERENTIAL/PLATELET
BASOS ABS: 0 10*3/uL (ref 0.0–0.1)
BASOS PCT: 0 %
EOS PCT: 1 %
Eosinophils Absolute: 0.1 10*3/uL (ref 0.0–0.7)
HCT: 38.7 % (ref 36.0–46.0)
Hemoglobin: 12.6 g/dL (ref 12.0–15.0)
Lymphocytes Relative: 23 %
Lymphs Abs: 1.8 10*3/uL (ref 0.7–4.0)
MCH: 26.5 pg (ref 26.0–34.0)
MCHC: 32.6 g/dL (ref 30.0–36.0)
MCV: 81.5 fL (ref 78.0–100.0)
Monocytes Absolute: 0.5 10*3/uL (ref 0.1–1.0)
Monocytes Relative: 7 %
Neutro Abs: 5.7 10*3/uL (ref 1.7–7.7)
Neutrophils Relative %: 69 %
PLATELETS: 166 10*3/uL (ref 150–400)
RBC: 4.75 MIL/uL (ref 3.87–5.11)
RDW: 14.9 % (ref 11.5–15.5)
WBC: 8.2 10*3/uL (ref 4.0–10.5)

## 2015-04-27 LAB — LACTIC ACID, PLASMA: Lactic Acid, Venous: 1.1 mmol/L (ref 0.5–2.0)

## 2015-04-27 LAB — LIPASE, BLOOD: LIPASE: 30 U/L (ref 22–51)

## 2015-04-27 MED ORDER — IOHEXOL 350 MG/ML SOLN
50.0000 mL | Freq: Once | INTRAVENOUS | Status: AC | PRN
  Administered 2015-04-27: 50 mL via INTRAVENOUS

## 2015-04-27 MED ORDER — ONDANSETRON HCL 4 MG/2ML IJ SOLN
4.0000 mg | Freq: Once | INTRAMUSCULAR | Status: AC
  Administered 2015-04-27: 4 mg via INTRAVENOUS
  Filled 2015-04-27: qty 2

## 2015-04-27 MED ORDER — MORPHINE SULFATE (PF) 4 MG/ML IV SOLN
4.0000 mg | Freq: Once | INTRAVENOUS | Status: AC
  Administered 2015-04-27: 4 mg via INTRAVENOUS
  Filled 2015-04-27: qty 1

## 2015-04-27 MED ORDER — SODIUM CHLORIDE 0.9 % IV BOLUS (SEPSIS)
1000.0000 mL | Freq: Once | INTRAVENOUS | Status: AC
  Administered 2015-04-27: 1000 mL via INTRAVENOUS

## 2015-04-27 MED ORDER — OXYCODONE-ACETAMINOPHEN 5-325 MG PO TABS: 1.0000 | ORAL_TABLET | Freq: Four times a day (QID) | ORAL | Status: DC | PRN

## 2015-04-27 NOTE — Progress Notes (Signed)
Dr. Radene Knee and Ivin Booty MD spoke about patient having CTA neck. They agreed patient needed CTA exam. Patient will be shielded. She is [redacted] weeks pregnant. Dr. Amedeo Gory will speak to patient regarding IV contrast and radiation exposure.

## 2015-04-27 NOTE — Discharge Instructions (Signed)
Ankle Fracture  A fracture is a break in a bone. The ankle joint is made up of three bones. These include the lower (distal)sections of your lower leg bones, called the tibia and fibula, along with a bone in your foot, called the talus. Depending on how bad the break is and if more than one ankle joint bone is broken, a cast or splint is used to protect and keep your injured bone from moving while it heals. Sometimes, surgery is required to help the fracture heal properly.   There are two general types of fractures:   Stable fracture. This includes a single fracture line through one bone, with no injury to ankle ligaments. A fracture of the talus that does not have any displacement (movement of the bone on either side of the fracture line) is also stable.   Unstable fracture. This includes more than one fracture line through one or more bones in the ankle joint. It also includes fractures that have displacement of the bone on either side of the fracture line.  CAUSES   A direct blow to the ankle.    Quickly and severely twisting your ankle.   Trauma, such as a car accident or falling from a significant height.  RISK FACTORS  You may be at a higher risk of ankle fracture if:   You have certain medical conditions.   You are involved in high-impact sports.   You are involved in a high-impact car accident.  SIGNS AND SYMPTOMS    Tender and swollen ankle.   Bruising around the injured ankle.   Pain on movement of the ankle.   Difficulty walking or putting weight on the ankle.   A cold foot below the site of the ankle injury. This can occur if the blood vessels passing through your injured ankle were also damaged.   Numbness in the foot below the site of the ankle injury.  DIAGNOSIS   An ankle fracture is usually diagnosed with a physical exam and X-rays. A CT scan may also be required for complex fractures.  TREATMENT   Stable fractures are treated with a cast or splint and using crutches to avoid putting  weight on your injured ankle. This is followed by an ankle strengthening program. Some patients require a special type of cast, depending on other medical problems they may have. Unstable fractures require surgery to ensure the bones heal properly. Your health care provider will tell you what type of fracture you have and the best treatment for your condition.  HOME CARE INSTRUCTIONS    Review correct crutch use with your health care provider and use your crutches as directed. Safe use of crutches is extremely important. Misuse of crutches can cause you to fall or cause injury to nerves in your hands or armpits.   Do not put weight or pressure on the injured ankle until directed by your health care provider.   To lessen the swelling, keep the injured leg elevated while sitting or lying down.   Apply ice to the injured area:   Put ice in a plastic bag.   Place a towel between your cast and the bag.   Leave the ice on for 20 minutes, 2-3 times a day.   If you have a plaster or fiberglass cast:   Do not try to scratch the skin under the cast with any objects. This can increase your risk of skin infection.   Check the skin around the cast every day. You   may put lotion on any red or sore areas.   Keep your cast dry and clean.   If you have a plaster splint:   Wear the splint as directed.   You may loosen the elastic around the splint if your toes become numb, tingle, or turn cold or blue.   Do not put pressure on any part of your cast or splint; it may break. Rest your cast only on a pillow the first 24 hours until it is fully hardened.   Your cast or splint can be protected during bathing with a plastic bag sealed to your skin with medical tape. Do not lower the cast or splint into water.   Take medicines as directed by your health care provider. Only take over-the-counter or prescription medicines for pain, discomfort, or fever as directed by your health care provider.   Do not drive a vehicle until  your health care provider specifically tells you it is safe to do so.   If your health care provider has given you a follow-up appointment, it is very important to keep that appointment. Not keeping the appointment could result in a chronic or permanent injury, pain, and disability. If you have any problem keeping the appointment, call the facility for assistance.  SEEK MEDICAL CARE IF:  You develop increased swelling or discomfort.  SEEK IMMEDIATE MEDICAL CARE IF:    Your cast gets damaged or breaks.   You have continued severe pain.   You develop new pain or swelling after the cast was put on.   Your skin or toenails below the injury turn blue or gray.   Your skin or toenails below the injury feel cold, numb, or have loss of sensitivity to touch.   There is a bad smell or pus draining from under the cast.  MAKE SURE YOU:    Understand these instructions.   Will watch your condition.   Will get help right away if you are not doing well or get worse.  Document Released: 07/10/2000 Document Revised: 07/18/2013 Document Reviewed: 02/09/2013  [employer] Patient Information 2015 [employer]. This information is not intended to replace advice given to you by your health care provider. Make sure you discuss any questions you have with your health care provider.
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
## 2015-04-27 NOTE — ED Notes (Signed)
Patient c/o collar being to big and wants to take it off.  Explained that we need to get the results from the xrays before removing it

## 2015-04-27 NOTE — ED Notes (Signed)
Ortho tech returned call.   

## 2015-04-27 NOTE — ED Notes (Signed)
Phlebotomy at the bedside  

## 2015-04-27 NOTE — Progress Notes (Signed)
Orthopedic Tech Progress Note Patient Details:  Eileen [name] [birth year] [id number]  Ortho Devices Type of Ortho Device: Ace wrap, Post (short leg) splint, Crutches Ortho Device/Splint Location: RLE Ortho Device/Splint Interventions: Ordered, Application   Braulio Bosch 04/27/2015, 11:20 PM
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
## 2015-04-27 NOTE — Progress Notes (Signed)
   04/27/15 1829  Clinical Encounter Type  Visited With Patient;Family;Health care provider  Visit Type Initial;Code  Referral From Nurse  Stress Factors  Patient Stress Factors Lack of knowledge   Chaplain met with patient and offered support, transportation for parents to come see her, and support is available as needed.   Jeri Lager, Chaplain 04/27/2015 6:30 PM

## 2015-04-27 NOTE — ED Notes (Signed)
Car seat given for child of 25lb and child of 37.6lb

## 2015-04-27 NOTE — ED Notes (Signed)
MD aware

## 2015-04-30 ENCOUNTER — Encounter (HOSPITAL_COMMUNITY): Payer: Self-pay | Admitting: *Deleted

## 2015-04-30 ENCOUNTER — Emergency Department (HOSPITAL_COMMUNITY)
Admission: EM | Admit: 2015-04-30 | Discharge: 2015-04-30 | Disposition: A | Discharge status: Home / Self Care | Payer: Medicaid Other | Attending: Emergency Medicine | Admitting: Emergency Medicine

## 2015-04-30 DIAGNOSIS — Y92410 Unspecified street and highway as the place of occurrence of the external cause: Secondary | ICD-10-CM | POA: Diagnosis not present

## 2015-04-30 DIAGNOSIS — Y9389 Activity, other specified: Secondary | ICD-10-CM | POA: Insufficient documentation

## 2015-04-30 DIAGNOSIS — M5410 Radiculopathy, site unspecified: Secondary | ICD-10-CM

## 2015-04-30 DIAGNOSIS — O9A211 Injury, poisoning and certain other consequences of external causes complicating pregnancy, first trimester: Secondary | ICD-10-CM | POA: Insufficient documentation

## 2015-04-30 DIAGNOSIS — F419 Anxiety disorder, unspecified: Secondary | ICD-10-CM | POA: Diagnosis not present

## 2015-04-30 DIAGNOSIS — O26831 Pregnancy related renal disease, first trimester: Secondary | ICD-10-CM | POA: Insufficient documentation

## 2015-04-30 DIAGNOSIS — O99331 Smoking (tobacco) complicating pregnancy, first trimester: Secondary | ICD-10-CM | POA: Insufficient documentation

## 2015-04-30 DIAGNOSIS — M542 Cervicalgia: Secondary | ICD-10-CM

## 2015-04-30 DIAGNOSIS — O99341 Other mental disorders complicating pregnancy, first trimester: Secondary | ICD-10-CM | POA: Insufficient documentation

## 2015-04-30 DIAGNOSIS — N189 Chronic kidney disease, unspecified: Secondary | ICD-10-CM | POA: Diagnosis not present

## 2015-04-30 DIAGNOSIS — Y998 Other external cause status: Secondary | ICD-10-CM | POA: Insufficient documentation

## 2015-04-30 DIAGNOSIS — Z8639 Personal history of other endocrine, nutritional and metabolic disease: Secondary | ICD-10-CM | POA: Insufficient documentation

## 2015-04-30 DIAGNOSIS — S2091XA Abrasion of unspecified parts of thorax, initial encounter: Secondary | ICD-10-CM | POA: Diagnosis not present

## 2015-04-30 DIAGNOSIS — S199XXA Unspecified injury of neck, initial encounter: Secondary | ICD-10-CM | POA: Insufficient documentation

## 2015-04-30 DIAGNOSIS — M5414 Radiculopathy, thoracic region: Secondary | ICD-10-CM | POA: Insufficient documentation

## 2015-04-30 DIAGNOSIS — Z862 Personal history of diseases of the blood and blood-forming organs and certain disorders involving the immune mechanism: Secondary | ICD-10-CM | POA: Insufficient documentation

## 2015-04-30 DIAGNOSIS — F17210 Nicotine dependence, cigarettes, uncomplicated: Secondary | ICD-10-CM | POA: Insufficient documentation

## 2015-04-30 DIAGNOSIS — Z3A01 Less than 8 weeks gestation of pregnancy: Secondary | ICD-10-CM | POA: Insufficient documentation
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
## 2015-04-30 NOTE — Discharge Instructions (Signed)
Cervical Sprain °A cervical sprain is an injury in the neck in which the strong, fibrous tissues (ligaments) that connect your neck bones stretch or tear. Cervical sprains can range from mild to severe. Severe cervical sprains can cause the neck vertebrae to be unstable. This can lead to damage of the spinal cord and can result in serious nervous system problems. The amount of time it takes for a cervical sprain to get better depends on the cause and extent of the injury. Most cervical sprains heal in 1 to 3 weeks. °CAUSES  °Severe cervical sprains may be caused by:  °· Contact sport injuries (such as from football, rugby, wrestling, hockey, auto racing, gymnastics, diving, martial arts, or boxing).   °· Motor vehicle collisions.   °· Whiplash injuries. This is an injury from a sudden forward and backward whipping movement of the head and neck.  °· Falls.   °Mild cervical sprains may be caused by:  °· Being in an awkward position, such as while cradling a telephone between your ear and shoulder.   °· Sitting in a chair that does not offer proper support.   °· Working at a poorly designed computer station.   °· Looking up or down for long periods of time.   °SYMPTOMS  °· Pain, soreness, stiffness, or a burning sensation in the front, back, or sides of the neck. This discomfort may develop immediately after the injury or slowly, 24 hours or more after the injury.   °· Pain or tenderness directly in the middle of the back of the neck.   °· Shoulder or upper back pain.   °· Limited ability to move the neck.   °· Headache.   °· Dizziness.   °· Weakness, numbness, or tingling in the hands or arms.   °· Muscle spasms.   °· Difficulty swallowing or chewing.   °· Tenderness and swelling of the neck.   °DIAGNOSIS  °Most of the time your health care provider can diagnose a cervical sprain by taking your history and doing a physical exam. Your health care provider will ask about previous neck injuries and any known neck  problems, such as arthritis in the neck. X-rays may be taken to find out if there are any other problems, such as with the bones of the neck. Other tests, such as a CT scan or MRI, may also be needed.  °TREATMENT  °Treatment depends on the severity of the cervical sprain. Mild sprains can be treated with rest, keeping the neck in place (immobilization), and pain medicines. Severe cervical sprains are immediately immobilized. Further treatment is done to help with pain, muscle spasms, and other symptoms and may include: °· Medicines, such as pain relievers, numbing medicines, or muscle relaxants.   °· Physical therapy. This may involve stretching exercises, strengthening exercises, and posture training. Exercises and improved posture can help stabilize the neck, strengthen muscles, and help stop symptoms from returning.   °HOME CARE INSTRUCTIONS  °· Put ice on the injured area.   °¨ Put ice in a plastic bag.   °¨ Place a towel between your skin and the bag.   °¨ Leave the ice on for 15-20 minutes, 3-4 times a day.   °· If your injury was severe, you may have been given a cervical collar to wear. A cervical collar is a two-piece collar designed to keep your neck from moving while it heals. °¨ Do not remove the collar unless instructed by your health care provider. °¨ If you have long hair, keep it outside of the collar. °¨ Ask your health care provider before making any adjustments to your collar. Minor   adjustments may be required over time to improve comfort and reduce pressure on your chin or on the back of your head.  Ifyou are allowed to remove the collar for cleaning or bathing, follow your health care provider's instructions on how to do so safely.  Keep your collar clean by wiping it with mild soap and water and drying it completely. If the collar you have been given includes removable pads, remove them every 1-2 days and hand wash them with soap and water. Allow them to air dry. They should be completely  dry before you wear them in the collar.  If you are allowed to remove the collar for cleaning and bathing, wash and dry the skin of your neck. Check your skin for irritation or sores. If you see any, tell your health care provider.  Do not drive while wearing the collar.   Only take over-the-counter or prescription medicines for pain, discomfort, or fever as directed by your health care provider.   Keep all follow-up appointments as directed by your health care provider.   Keep all physical therapy appointments as directed by your health care provider.   Make any needed adjustments to your workstation to promote good posture.   Avoid positions and activities that make your symptoms worse.   Warm up and stretch before being active to help prevent problems.  SEEK MEDICAL CARE IF:   Your pain is not controlled with medicine.   You are unable to decrease your pain medicine over time as planned.   Your activity level is not improving as expected.  SEEK IMMEDIATE MEDICAL CARE IF:   You develop any bleeding.  You develop stomach upset.  You have signs of an allergic reaction to your medicine.   Your symptoms get worse.   You develop new, unexplained symptoms.   You have numbness, tingling, weakness, or paralysis in any part of your body.  MAKE SURE YOU:   Understand these instructions.  Will watch your condition.  Will get help right away if you are not doing well or get worse. Document Released: 05/10/2007 Document Revised: 07/18/2013 Document Reviewed: 01/18/2013 [facility] Patient Information 2015 [place], [place]. This information is not intended to replace advice given to you by your health care provider. Make sure you discuss any questions you have with your health care provider.  Please return immediately if new or worsening signs or symptoms present. Please follow-up with primary care for reevaluation and further management.
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
## 2015-05-14 ENCOUNTER — Telehealth: Payer: Self-pay | Admitting: *Deleted

## 2015-05-14 NOTE — Telephone Encounter (Signed)
-----   Message from Francia Greaves sent at 05/14/2015 11:15 AM EDT ----- Regarding: Early Preg Nausea Contact: [phone] Early preg, initial work up not until Nov. Wants to know if there is anything she can have for nausea

## 2015-05-14 NOTE — Telephone Encounter (Signed)
Pt c/o nausea and vomiting, has tried Phenergan with no relief, will send in prior authorization for Diclegis, will call pt as soon as we get authorization.

## 2015-05-16 ENCOUNTER — Telehealth: Payer: Self-pay | Admitting: *Deleted

## 2015-05-16 DIAGNOSIS — O219 Vomiting of pregnancy, unspecified: Secondary | ICD-10-CM

## 2015-05-16 MED ORDER — DOXYLAMINE-PYRIDOXINE 10-10 MG PO TBEC: DELAYED_RELEASE_TABLET | ORAL | Status: DC

## 2015-05-16 NOTE — Telephone Encounter (Signed)
Patient called and needed a prescription called in for nausea.  I have sent in Diclegis to patients pharmacy.

## 2015-05-26 ENCOUNTER — Encounter (HOSPITAL_COMMUNITY): Payer: Self-pay

## 2015-05-26 ENCOUNTER — Inpatient Hospital Stay (HOSPITAL_COMMUNITY)
Admission: AD | Admit: 2015-05-26 | Discharge: 2015-05-26 | Disposition: A | Discharge status: Home / Self Care | Payer: Medicaid Other | Source: Ambulatory Visit | Attending: Family Medicine | Admitting: Family Medicine

## 2015-05-26 DIAGNOSIS — O219 Vomiting of pregnancy, unspecified: Secondary | ICD-10-CM

## 2015-05-26 DIAGNOSIS — Z3A08 8 weeks gestation of pregnancy: Secondary | ICD-10-CM | POA: Diagnosis not present

## 2015-05-26 DIAGNOSIS — O210 Mild hyperemesis gravidarum: Secondary | ICD-10-CM | POA: Diagnosis not present

## 2015-05-26 DIAGNOSIS — O99331 Smoking (tobacco) complicating pregnancy, first trimester: Secondary | ICD-10-CM | POA: Insufficient documentation

## 2015-05-26 LAB — URINE MICROSCOPIC-ADD ON

## 2015-05-26 LAB — URINALYSIS, ROUTINE W REFLEX MICROSCOPIC
Bilirubin Urine: NEGATIVE
GLUCOSE, UA: NEGATIVE mg/dL
Ketones, ur: 15 mg/dL — AB
Nitrite: NEGATIVE
PROTEIN: NEGATIVE mg/dL
SPECIFIC GRAVITY, URINE: 1.02 (ref 1.005–1.030)
Urobilinogen, UA: 0.2 mg/dL (ref 0.0–1.0)
pH: 6.5 (ref 5.0–8.0)

## 2015-05-26 LAB — POCT PREGNANCY, URINE: Preg Test, Ur: POSITIVE — AB

## 2015-05-26 MED ORDER — PROMETHAZINE HCL 25 MG/ML IJ SOLN
25.0000 mg | Freq: Once | INTRAMUSCULAR | Status: AC
  Administered 2015-05-26: 25 mg via INTRAMUSCULAR
  Filled 2015-05-26: qty 1

## 2015-05-26 MED ORDER — PROMETHAZINE HCL 25 MG PO TABS: 25.0000 mg | ORAL_TABLET | Freq: Four times a day (QID) | ORAL | Status: DC | PRN

## 2015-05-26 MED ORDER — ONDANSETRON 4 MG PO TBDP: 4.0000 mg | ORAL_TABLET | Freq: Once | ORAL | Status: DC

## 2015-05-26 NOTE — MAU Note (Signed)
Pt came in for nausea and vomiting and refused zofran ODTand phenergan suppository.  Said diclegis isn't helping.

## 2015-05-26 NOTE — MAU Provider Note (Signed)
History   [id number] at 8 wks in with nausea and vomiting. States cannot keep anything down. Presently taking diclegus that is not working.   CSN: [id number]  Arrival date and time: 05/26/15 1910   None     Chief Complaint  Patient presents with  . Nausea  . Emesis   HPI  OB History    Gravida Para Term Preterm AB TAB SAB Ectopic Multiple Living   6 3 3  0 2 2  0 0 3      Past Medical History  Diagnosis Date  . Dysuria   . Headache(784.0)   . Allergy   . Anemia   . Anxiety   . Hypothyroid     history of.  . Chronic kidney disease     kidney infection.  . Abortion 08/04/2011  . Mitral valve prolapse     Past Surgical History  Procedure Laterality Date  . Therapeutic abortion N/A 2012  . Laparoscopy N/A 05/25/2014    Procedure: LAPAROSCOPY DIAGNOSTIC;  Surgeon: Osborne Oman, MD;  Location: [facility];  Service: Gynecology;  Laterality: N/A;    Family History  Problem Relation Age of Onset  . Cancer Mother     SKIN  . Hyperlipidemia Mother   . Thyroid disease Mother   . Heart disease Mother   . Asthma Brother   . Diabetes Maternal Grandmother   . Hyperlipidemia Maternal Grandmother   . Emphysema Paternal Grandfather     Social History  Substance Use Topics  . Smoking status: Current Every Day Smoker -- 0.50 packs/day  . Smokeless tobacco: Never Used  . Alcohol Use: 0.0 oz/week    0 Standard drinks or equivalent per week     Comment: occasional but not while pregnant    Allergies:  Allergies  Allergen Reactions  . Other Hives and Nausea And Vomiting    Green beans  . Red Dye Nausea And Vomiting  . Sulfa Antibiotics Hives and Nausea And Vomiting    Childhood reaction    Facility-administered medications prior to admission  Medication Dose Route Frequency Provider Last Rate Last Dose  . medroxyPROGESTERone (DEPO-PROVERA) injection 150 mg  150 mg Intramuscular Q90 days Osborne Oman, MD   150 mg at 08/16/14 1036   Prescriptions prior to  admission  Medication Sig Dispense Refill Last Dose  . acetaminophen (TYLENOL) 500 MG tablet Take 500 mg by mouth daily as needed for headache.   04/27/2015 at 1100  . Doxylamine-Pyridoxine 10-10 MG TBEC 2 qhs, 1 qam, and 1 qpm 120 tablet 3   . gabapentin (NEURONTIN) 300 MG capsule Take 1 capsule (300 mg total) by mouth 3 (three) times daily. (Patient not taking: Reported on 04/27/2015) 90 capsule 3 Not Taking at Unknown time  . ibuprofen (ADVIL,MOTRIN) 600 MG tablet Take 1 tablet (600 mg total) by mouth every 6 (six) hours as needed. (Patient not taking: Reported on 04/27/2015) 60 tablet 3 Not Taking at Unknown time  . Multiple Vitamins-Minerals (ADULT ONE DAILY GUMMIES) CHEW Chew 2 tablets by mouth daily.   04/25/2015  . oxyCODONE-acetaminophen (PERCOCET/ROXICET) 5-325 MG tablet Take 1 tablet by mouth every 6 (six) hours as needed for severe pain. 10 tablet 0   . Vitamin D, Ergocalciferol, (DRISDOL) 50000 UNITS CAPS capsule Take 1 capsule (50,000 Units total) by mouth every 7 (seven) days. (Patient not taking: Reported on 04/27/2015) 8 capsule 0 Not Taking at Unknown time    Review of Systems  Constitutional: Negative.   HENT: Negative.  Eyes: Negative.   Respiratory: Negative.   Cardiovascular: Negative.   Gastrointestinal: Positive for nausea and vomiting.  Genitourinary: Negative.   Musculoskeletal: Negative.   Skin: Negative.   Neurological: Negative.   Endo/Heme/Allergies: Negative.   Psychiatric/Behavioral: Negative.    Physical Exam   Blood pressure 98/68, pulse 83, temperature 97.9 F (36.6 C), temperature source Oral, resp. rate 20, height 4' 11.5" (1.511 m), weight 107 lb 3.2 oz (48.626 kg), last menstrual period 04/02/2015, SpO2 100 %, unknown if currently breastfeeding.  Physical Exam  Constitutional: She is oriented to person, place, and time. She appears well-developed and well-nourished.  HENT:  Head: Normocephalic.  Eyes: Pupils are equal, round, and reactive to light.   Neck: Normal range of motion.  Cardiovascular: Normal rate, regular rhythm, normal heart sounds and intact distal pulses.   Respiratory: Effort normal and breath sounds normal.  GI: Soft. Bowel sounds are normal.  Musculoskeletal: Normal range of motion.  Neurological: She is alert and oriented to person, place, and time. She has normal reflexes.  Skin: Skin is warm and dry.  Psychiatric: She has a normal mood and affect. Her behavior is normal. Judgment and thought content normal.    MAU Course  Procedures  MDM Nausea and vomiting of pregnancy.  Assessment and Plan  U/a shows 15 ketones, refuses zofran, will accept phenergan IM. Will send home with po phenergan.  Eileen [name] Eileen [name] 05/26/2015, 9:02 PM

## 2015-05-26 NOTE — Discharge Instructions (Signed)
Morning Sickness Morning sickness is when you feel sick to your stomach (nauseous) during pregnancy. This nauseous feeling may or may not come with vomiting. It often occurs in the morning but can be a problem any time of day. Morning sickness is most common during the first trimester, but it may continue throughout pregnancy. While morning sickness is unpleasant, it is usually harmless unless you develop severe and continual vomiting (hyperemesis gravidarum). This condition requires more intense treatment.  CAUSES  The cause of morning sickness is not completely known but seems to be related to normal hormonal changes that occur in pregnancy. RISK FACTORS You are at greater risk if you:  Experienced nausea or vomiting before your pregnancy.  Had morning sickness during a previous pregnancy.  Are pregnant with more than one baby, such as twins. TREATMENT  Do not use any medicines (prescription, over-the-counter, or herbal) for morning sickness without first talking to your health care provider. Your health care provider may prescribe or recommend:  Vitamin B6 supplements.  Anti-nausea medicines.  The herbal medicine ginger. HOME CARE INSTRUCTIONS   Only take over-the-counter or prescription medicines as directed by your health care provider.  Taking multivitamins before getting pregnant can prevent or decrease the severity of morning sickness in most women.  Eat a piece of dry toast or unsalted crackers before getting out of bed in the morning.  Eat five or six small meals a day.  Eat dry and bland foods (rice, baked potato). Foods high in carbohydrates are often helpful.  Do not drink liquids with your meals. Drink liquids between meals.  Avoid greasy, fatty, and spicy foods.  Get someone to cook for you if the smell of any food causes nausea and vomiting.  If you feel nauseous after taking prenatal vitamins, take the vitamins at night or with a snack.  Snack on protein  foods (nuts, yogurt, cheese) between meals if you are hungry.  Eat unsweetened gelatins for desserts.  Wearing an acupressure wristband (worn for sea sickness) may be helpful.  Acupuncture may be helpful.  Do not smoke.  Get a humidifier to keep the air in your house free of odors.  Get plenty of fresh air. SEEK MEDICAL CARE IF:   Your home remedies are not working, and you need medicine.  You feel dizzy or lightheaded.  You are losing weight. SEEK IMMEDIATE MEDICAL CARE IF:   You have persistent and uncontrolled nausea and vomiting.  You pass out (faint). MAKE SURE YOU:  Understand these instructions.  Will watch your condition.  Will get help right away if you are not doing well or get worse.   This information is not intended to replace advice given to you by your health care provider. Make sure you discuss any questions you have with your health care provider.   Document Released: 09/03/2006 Document Revised: 07/18/2013 Document Reviewed: 12/28/2012 Elsevier Interactive Patient Education 2016 Elsevier Inc.  Morning Sickness Morning sickness is when you feel sick to your stomach (nauseous) during pregnancy. This nauseous feeling may or may not come with vomiting. It often occurs in the morning but can be a problem any time of day. Morning sickness is most common during the first trimester, but it may continue throughout pregnancy. While morning sickness is unpleasant, it is usually harmless unless you develop severe and continual vomiting (hyperemesis gravidarum). This condition requires more intense treatment.  CAUSES  The cause of morning sickness is not completely known but seems to be related to normal hormonal  changes that occur in pregnancy. RISK FACTORS You are at greater risk if you:  Experienced nausea or vomiting before your pregnancy.  Had morning sickness during a previous pregnancy.  Are pregnant with more than one baby, such as twins. TREATMENT  Do  not use any medicines (prescription, over-the-counter, or herbal) for morning sickness without first talking to your health care provider. Your health care provider may prescribe or recommend:  Vitamin B6 supplements.  Anti-nausea medicines.  The herbal medicine ginger. HOME CARE INSTRUCTIONS   Only take over-the-counter or prescription medicines as directed by your health care provider.  Taking multivitamins before getting pregnant can prevent or decrease the severity of morning sickness in most women.  Eat a piece of dry toast or unsalted crackers before getting out of bed in the morning.  Eat five or six small meals a day.  Eat dry and bland foods (rice, baked potato). Foods high in carbohydrates are often helpful.  Do not drink liquids with your meals. Drink liquids between meals.  Avoid greasy, fatty, and spicy foods.  Get someone to cook for you if the smell of any food causes nausea and vomiting.  If you feel nauseous after taking prenatal vitamins, take the vitamins at night or with a snack.  Snack on protein foods (nuts, yogurt, cheese) between meals if you are hungry.  Eat unsweetened gelatins for desserts.  Wearing an acupressure wristband (worn for sea sickness) may be helpful.  Acupuncture may be helpful.  Do not smoke.  Get a humidifier to keep the air in your house free of odors.  Get plenty of fresh air. SEEK MEDICAL CARE IF:   Your home remedies are not working, and you need medicine.  You feel dizzy or lightheaded.  You are losing weight. SEEK IMMEDIATE MEDICAL CARE IF:   You have persistent and uncontrolled nausea and vomiting.  You pass out (faint). MAKE SURE YOU:  Understand these instructions.  Will watch your condition.  Will get help right away if you are not doing well or get worse.   This information is not intended to replace advice given to you by your health care provider. Make sure you discuss any questions you have with your  health care provider.   Document Released: 09/03/2006 Document Revised: 07/18/2013 Document Reviewed: 12/28/2012 Elsevier Interactive Patient Education [employer].

## 2015-05-26 NOTE — MAU Note (Signed)
Pt states that she has had severe nausea and vomiting for several days now. Worried about being dehydrated. Takes Diclegis and phenergan, which are not helping. LMP: cannot remember but thinks she is around [redacted] weeks pregnant. Has appointment at [facility] in November.

## 2015-06-05 ENCOUNTER — Encounter: Payer: Medicaid Other | Admitting: Obstetrics & Gynecology

## 2015-06-11 ENCOUNTER — Encounter: Payer: Medicaid Other | Admitting: Family Medicine

## 2015-09-04 ENCOUNTER — Other Ambulatory Visit (HOSPITAL_COMMUNITY)
Admission: RE | Admit: 2015-09-04 | Discharge: 2015-09-04 | Disposition: A | Discharge status: Home / Self Care | Payer: Medicaid Other | Source: Ambulatory Visit | Attending: Certified Nurse Midwife | Admitting: Certified Nurse Midwife

## 2015-09-04 ENCOUNTER — Encounter: Payer: Self-pay | Admitting: Certified Nurse Midwife

## 2015-09-04 ENCOUNTER — Ambulatory Visit (INDEPENDENT_AMBULATORY_CARE_PROVIDER_SITE_OTHER): Payer: Medicaid Other | Admitting: Certified Nurse Midwife

## 2015-09-04 VITALS — BP 121/86 | HR 99 | Resp 18 | Ht <= 58 in | Wt 113.0 lb

## 2015-09-04 DIAGNOSIS — Z113 Encounter for screening for infections with a predominantly sexual mode of transmission: Secondary | ICD-10-CM | POA: Diagnosis not present

## 2015-09-04 DIAGNOSIS — N898 Other specified noninflammatory disorders of vagina: Secondary | ICD-10-CM | POA: Diagnosis not present

## 2015-09-04 NOTE — Progress Notes (Signed)
[name], female   DOB: 02/11/1991, 25 y.o.   MRN: [id number]  CC: Vaginal Discharge    None     HPI Eileen [name] is a 25 y.o. [id number] Unknown who presents with onset of vaginal discharge. Denies abnormal bleeding.  Past Medical History  Diagnosis Date  . Dysuria   . Headache(784.0)   . Allergy   . Anemia   . Anxiety   . Hypothyroid     history of.  . Chronic kidney disease     kidney infection.  . Abortion 08/04/2011  . Mitral valve prolapse     OB History  Gravida Para Term Preterm AB SAB TAB Ectopic Multiple Living  6 3 3  0 2  2 0 0 3    # Outcome Date GA Lbr Len/2nd Weight Sex Delivery Anes PTL Lv  6 Term 06/16/13 [redacted]w[redacted]d 19:30 / 00:31 6 lb 1.9 oz (2.775 kg) F Vag-Spont EPI  Y  5 TAB 2013          4 Term 01/01/10 [redacted]w[redacted]d  7 lb 3 oz (3.26 kg) F Vag-Spont  N Y  3 Term 08/2008   6 lb 11 oz (3.033 kg) F Vag-Spont  N Y  2 TAB 2008          1 Gravida               Past Surgical History  Procedure Laterality Date  . Therapeutic abortion N/A 2012  . Laparoscopy N/A 05/25/2014    Procedure: LAPAROSCOPY DIAGNOSTIC;  Surgeon: Osborne Oman, MD;  Location: [facility];  Service: Gynecology;  Laterality: N/A;    Social History   Social History  . Marital Status: Single    Spouse Name: N/A  . Number of Children: N/A  . Years of Education: N/A   Occupational History  . Not on file.   Social History Main Topics  . Smoking status: Current Every Day Smoker -- 0.50 packs/day  . Smokeless tobacco: Never Used  . Alcohol Use: 0.0 oz/week    0 Standard drinks or equivalent per week     Comment: occasional but not while pregnant  . Drug Use: No  . Sexual Activity:    Partners: Male    Birth Control/ Protection: None     Comment: Mirena   Other Topics Concern  . Not on file   Social History Narrative    Current Outpatient Prescriptions on File Prior to Visit  Medication Sig Dispense Refill  . acetaminophen (TYLENOL) 500 MG tablet Take 500 mg by mouth  daily as needed for mild pain or headache.     . ibuprofen (ADVIL,MOTRIN) 600 MG tablet Take 1 tablet (600 mg total) by mouth every 6 (six) hours as needed. 60 tablet 3  . Vitamin D, Ergocalciferol, (DRISDOL) 50000 UNITS CAPS capsule Take 1 capsule (50,000 Units total) by mouth every 7 (seven) days. 8 capsule 0   Current Facility-Administered Medications on File Prior to Visit  Medication Dose Route Frequency Provider Last Rate Last Dose  . medroxyPROGESTERone (DEPO-PROVERA) injection 150 mg  150 mg Intramuscular Q90 days Osborne Oman, MD   150 mg at 08/16/14 1036    Allergies  Allergen Reactions  . Other Hives and Nausea And Vomiting    Green beans  . Red Dye Nausea And Vomiting  . Sulfa Antibiotics Hives and Nausea And Vomiting    Childhood reaction     ROS Pertinent items in HPI   PHYSICAL EXAM Filed Vitals:  09/04/15 1543  BP: 121/86  Pulse: 99  Resp: 18   General: Well nourished, well developed female in no acute distress Cardiovascular: Normal rate Respiratory: Normal effort Abdomen: Soft, nontender Back: No CVAT Extremities: No edema Neurologic:grossly intact Speculum exam: NEFG; vagina with physiologic discharge, no blood; cervix clean Bimanual exam: cervix closed, no CMT; uterus NSSP; no adnexal tenderness or masses      ASSESSMENT  1. Vaginal discharge    Possible STD Exposure     Medication List       This list is accurate as of: 09/04/15  4:06 PM.  Always use your most recent med list.               acetaminophen 500 MG tablet  Commonly known as:  TYLENOL  Take 500 mg by mouth daily as needed for mild pain or headache.     ibuprofen 600 MG tablet  Commonly known as:  ADVIL,MOTRIN  Take 1 tablet (600 mg total) by mouth every 6 (six) hours as needed.     Vitamin D (Ergocalciferol) 50000 units Caps capsule  Commonly known as:  DRISDOL  Take 1 capsule (50,000 Units total) by mouth every 7 (seven) days.       Plan Urine  Culture STD Testing WEt Prep Treat appropriately when results are back    Larey Days, CNM 09/04/2015 4:06 PM

## 2015-09-05 ENCOUNTER — Telehealth: Payer: Self-pay | Admitting: *Deleted

## 2015-09-05 DIAGNOSIS — N760 Acute vaginitis: Principal | ICD-10-CM

## 2015-09-05 DIAGNOSIS — B9689 Other specified bacterial agents as the cause of diseases classified elsewhere: Secondary | ICD-10-CM

## 2015-09-05 LAB — WET PREP, GENITAL
Trich, Wet Prep: NONE SEEN
Yeast Wet Prep HPF POC: NONE SEEN

## 2015-09-05 LAB — HEPATITIS C ANTIBODY: HCV Ab: NEGATIVE

## 2015-09-05 LAB — HEPATITIS B SURFACE ANTIGEN: Hepatitis B Surface Ag: NEGATIVE

## 2015-09-05 LAB — RPR

## 2015-09-05 LAB — HIV ANTIBODY (ROUTINE TESTING W REFLEX): HIV 1&2 Ab, 4th Generation: NONREACTIVE

## 2015-09-05 MED ORDER — METRONIDAZOLE 500 MG PO TABS: 500.0000 mg | ORAL_TABLET | Freq: Two times a day (BID) | ORAL | Status: DC

## 2015-09-05 NOTE — Telephone Encounter (Signed)
Informed pt of BV on wet prep and sent prescription for Flagyl 500mg  to pharmacy, instructed pt on medication use.

## 2015-09-06 LAB — URINE CULTURE: Colony Count: 75000

## 2015-09-06 LAB — GC/CHLAMYDIA PROBE AMP (~~LOC~~) NOT AT ARMC
Chlamydia: NEGATIVE
Neisseria Gonorrhea: NEGATIVE

## 2016-03-03 ENCOUNTER — Encounter: Payer: Self-pay | Admitting: *Deleted

## 2016-03-03 ENCOUNTER — Other Ambulatory Visit (INDEPENDENT_AMBULATORY_CARE_PROVIDER_SITE_OTHER): Payer: Medicaid Other | Admitting: *Deleted

## 2016-03-03 DIAGNOSIS — Z3201 Encounter for pregnancy test, result positive: Secondary | ICD-10-CM | POA: Diagnosis not present

## 2016-03-03 DIAGNOSIS — N912 Amenorrhea, unspecified: Secondary | ICD-10-CM

## 2016-03-03 LAB — POCT URINE PREGNANCY: PREG TEST UR: POSITIVE — AB

## 2016-03-03 NOTE — Progress Notes (Signed)
Pt here today for UPT, last LMP 02-05-16.  UPT + gave verification letter.  Pt is going to follow-up and have prenatal care in [place] at [facility].

## 2016-03-26 ENCOUNTER — Telehealth: Payer: Self-pay | Admitting: *Deleted

## 2016-03-26 DIAGNOSIS — O219 Vomiting of pregnancy, unspecified: Secondary | ICD-10-CM

## 2016-03-26 MED ORDER — METOCLOPRAMIDE HCL 10 MG PO TABS: 10.0000 mg | ORAL_TABLET | Freq: Four times a day (QID) | ORAL | 3 refills | Status: DC | PRN

## 2016-03-26 NOTE — Telephone Encounter (Signed)
-----   Message from Francia Greaves sent at 03/26/2016  8:25 AM EDT ----- Regarding: Rx Request Contact: [phone] Tx from our office to [facility], has not had new ob appt yet, wants to know if she could get Zofran called in Uses [facility] on [address]

## 2016-03-31 ENCOUNTER — Encounter (HOSPITAL_COMMUNITY): Payer: Self-pay | Admitting: *Deleted

## 2016-03-31 ENCOUNTER — Inpatient Hospital Stay (HOSPITAL_COMMUNITY)
Admission: AD | Admit: 2016-03-31 | Discharge: 2016-04-01 | Disposition: A | Discharge status: Home / Self Care | Payer: Medicaid Other | Source: Ambulatory Visit | Attending: Family Medicine | Admitting: Family Medicine

## 2016-03-31 DIAGNOSIS — O99342 Other mental disorders complicating pregnancy, second trimester: Secondary | ICD-10-CM | POA: Insufficient documentation

## 2016-03-31 DIAGNOSIS — Z3A22 22 weeks gestation of pregnancy: Secondary | ICD-10-CM | POA: Diagnosis not present

## 2016-03-31 DIAGNOSIS — F17210 Nicotine dependence, cigarettes, uncomplicated: Secondary | ICD-10-CM | POA: Diagnosis not present

## 2016-03-31 DIAGNOSIS — O219 Vomiting of pregnancy, unspecified: Secondary | ICD-10-CM | POA: Diagnosis not present

## 2016-03-31 DIAGNOSIS — O99332 Smoking (tobacco) complicating pregnancy, second trimester: Secondary | ICD-10-CM | POA: Diagnosis not present

## 2016-03-31 DIAGNOSIS — E039 Hypothyroidism, unspecified: Secondary | ICD-10-CM | POA: Diagnosis not present

## 2016-03-31 DIAGNOSIS — O99282 Endocrine, nutritional and metabolic diseases complicating pregnancy, second trimester: Secondary | ICD-10-CM | POA: Insufficient documentation

## 2016-03-31 DIAGNOSIS — O26832 Pregnancy related renal disease, second trimester: Secondary | ICD-10-CM | POA: Diagnosis not present

## 2016-03-31 DIAGNOSIS — Z79899 Other long term (current) drug therapy: Secondary | ICD-10-CM | POA: Insufficient documentation

## 2016-03-31 DIAGNOSIS — N189 Chronic kidney disease, unspecified: Secondary | ICD-10-CM | POA: Insufficient documentation

## 2016-03-31 DIAGNOSIS — F419 Anxiety disorder, unspecified: Secondary | ICD-10-CM | POA: Diagnosis not present

## 2016-03-31 DIAGNOSIS — O26892 Other specified pregnancy related conditions, second trimester: Secondary | ICD-10-CM | POA: Insufficient documentation

## 2016-03-31 DIAGNOSIS — O210 Mild hyperemesis gravidarum: Secondary | ICD-10-CM | POA: Diagnosis not present

## 2016-03-31 LAB — URINALYSIS, ROUTINE W REFLEX MICROSCOPIC
Glucose, UA: NEGATIVE mg/dL
Hgb urine dipstick: NEGATIVE
Ketones, ur: 40 mg/dL — AB
Leukocytes, UA: NEGATIVE
Nitrite: NEGATIVE
Protein, ur: 30 mg/dL — AB
Specific Gravity, Urine: 1.02 (ref 1.005–1.030)
pH: 7 (ref 5.0–8.0)

## 2016-03-31 LAB — URINE MICROSCOPIC-ADD ON

## 2016-03-31 MED ORDER — PROMETHAZINE HCL 25 MG/ML IJ SOLN
25.0000 mg | Freq: Once | INTRAMUSCULAR | Status: AC
  Administered 2016-03-31: 25 mg via INTRAMUSCULAR
  Filled 2016-03-31: qty 1

## 2016-03-31 NOTE — MAU Note (Signed)
PT SAYS  SHE  TOOK REGLAN-    11 AM-  VOMITED.      [place]-   WITH  [facility]-  DID UPT-    AUG.     NEXT APPOINTMENT    IS  WITH  [facility]  9-12.

## 2016-04-01 DIAGNOSIS — O219 Vomiting of pregnancy, unspecified: Secondary | ICD-10-CM | POA: Diagnosis not present

## 2016-04-01 MED ORDER — PROMETHAZINE HCL 25 MG PO TABS: 25.0000 mg | ORAL_TABLET | Freq: Four times a day (QID) | ORAL | 1 refills | Status: DC | PRN

## 2016-04-01 NOTE — Discharge Instructions (Signed)

## 2016-04-01 NOTE — MAU Provider Note (Signed)
Chief Complaint: Morning Sickness   First Provider Initiated Contact with Patient 03/31/16 2352     SUBJECTIVE HPI: Eileen [name] is a 25 y.o. [id number] at [redacted]w[redacted]d who presents to [facility] reporting exacerbation of nausea of pregnancy. Usually vomits several times per day, but is able to keep down sufficient food and fluids. Reports vomiting every few minutes tonight.   Modifying factors: Took Reglan but vomited it up.  Associated signs and symptoms: Negative for fever, chills, diarrhea, sick contacts.   Past Medical History:  Diagnosis Date  . Abortion 08/04/2011  . Allergy   . Anemia   . Anxiety   . Chronic kidney disease    kidney infection.  . Dysuria   . Headache(784.0)   . Hypothyroid    history of.  . Mitral valve prolapse    OB History  Gravida Para Term Preterm AB Living  7 3 3  0 2 3  SAB TAB Ectopic Multiple Live Births    2 0 0 3    # Outcome Date GA Lbr Len/2nd Weight Sex Delivery Anes PTL Lv  7 Current           6 Term 06/16/13 [redacted]w[redacted]d 19:30 / 00:31 6 lb 1.9 oz (2.775 kg) F Vag-Spont EPI  LIV  5 TAB 2013          4 Term 01/01/10 [redacted]w[redacted]d  7 lb 3 oz (3.26 kg) F Vag-Spont  N LIV  3 Term 08/2008   6 lb 11 oz (3.033 kg) F Vag-Spont  N LIV  2 TAB 2008          1 Gravida              Past Surgical History:  Procedure Laterality Date  . LAPAROSCOPY N/A 05/25/2014   Procedure: LAPAROSCOPY DIAGNOSTIC;  Surgeon: Osborne Oman, MD;  Location: [facility];  Service: Gynecology;  Laterality: N/A;  . THERAPEUTIC ABORTION N/A 2012   Social History   Social History  . Marital status: Single    Spouse name: N/A  . Number of children: N/A  . Years of education: N/A   Occupational History  . Not on file.   Social History Main Topics  . Smoking status: Current Every Day Smoker    Packs/day: 0.50  . Smokeless tobacco: Never Used  . Alcohol use 0.0 oz/week     Comment: occasional but not while pregnant  . Drug use: No  . Sexual activity: Yes    Partners: Male   Birth control/ protection: None     Comment: Mirena   Other Topics Concern  . Not on file   Social History Narrative  . No narrative on file   No current facility-administered medications on file prior to encounter.    Current Outpatient Prescriptions on File Prior to Encounter  Medication Sig Dispense Refill  . metoCLOPramide (REGLAN) 10 MG tablet Take 1 tablet (10 mg total) by mouth 4 (four) times daily as needed for nausea or vomiting. 30 tablet 3  . Vitamin D, Ergocalciferol, (DRISDOL) 50000 UNITS CAPS capsule Take 1 capsule (50,000 Units total) by mouth every 7 (seven) days. 8 capsule 0  . acetaminophen (TYLENOL) 500 MG tablet Take 500 mg by mouth daily as needed for mild pain or headache.     . ibuprofen (ADVIL,MOTRIN) 600 MG tablet Take 1 tablet (600 mg total) by mouth every 6 (six) hours as needed. (Patient not taking: Reported on 03/31/2016) 60 tablet 3  . metroNIDAZOLE (FLAGYL) 500 MG tablet  Take 1 tablet (500 mg total) by mouth 2 (two) times daily. (Patient not taking: Reported on 03/31/2016) 14 tablet 0   Allergies  Allergen Reactions  . Eggs Or Egg-Derived Products Nausea Only  . Other Hives and Nausea And Vomiting    Green beans  . Red Dye Nausea And Vomiting  . Sulfa Antibiotics Hives and Nausea And Vomiting    Childhood reaction    I have reviewed the past Medical Hx, Surgical Hx, Social Hx, Allergies and Medications.   Review of Systems  Constitutional: Negative for appetite change, chills and fever.  Gastrointestinal: Positive for nausea and vomiting. Negative for abdominal pain, constipation and diarrhea.  Genitourinary: Negative for dysuria, flank pain, frequency, hematuria, urgency and vaginal bleeding.    OBJECTIVE Patient Vitals for the past 24 hrs:  BP Temp Temp src Pulse Resp Height Weight  04/01/16 0016 108/64 - - 78 - - -  03/31/16 2136 101/64 98.3 F (36.8 C) Oral 105 20 4\' 10"  (1.473 m) 113 lb 8 oz (51.5 kg)   Constitutional: Well-developed,  well-nourished female in no acute distress.  Head: Mucous membranes moist. Cardiovascular: normal rate Respiratory: normal rate and effort.  GI: Abd soft, non-tender, gravid appropriate for gestational age. Pos BS x 4 MS: Extremities nontender, no edema, normal ROM Neurologic: Alert and oriented x 4.  GU: Neg CVAT.   LAB RESULTS Results for orders placed or performed during the hospital encounter of 03/31/16 (from the past 24 hour(s))  Urinalysis, Routine w reflex microscopic (not at [facility])     Status: Abnormal   Collection Time: 03/31/16  9:37 PM  Result Value Ref Range   Color, Urine YELLOW YELLOW   APPearance CLEAR CLEAR   Specific Gravity, Urine 1.020 1.005 - 1.030   pH 7.0 5.0 - 8.0   Glucose, UA NEGATIVE NEGATIVE mg/dL   Hgb urine dipstick NEGATIVE NEGATIVE   Bilirubin Urine SMALL (A) NEGATIVE   Ketones, ur 40 (A) NEGATIVE mg/dL   Protein, ur 30 (A) NEGATIVE mg/dL   Nitrite NEGATIVE NEGATIVE   Leukocytes, UA NEGATIVE NEGATIVE  Urine microscopic-add on     Status: Abnormal   Collection Time: 03/31/16  9:37 PM  Result Value Ref Range   Squamous Epithelial / LPF 0-5 (A) NONE SEEN   WBC, UA 0-5 0 - 5 WBC/hpf   RBC / HPF 0-5 0 - 5 RBC/hpf   Bacteria, UA FEW (A) NONE SEEN    IMAGING No results found.  MAU COURSE IM Phenergan.  Nausea and vomiting resolved. Declines PO challenge. Feels ready to go home.  MDM - Exacerbation of nausea and vomiting pregnancy versus viral gastroenteritis. No evidence of emergent condition. Symptoms well controlled with Phenergan.  ASSESSMENT 1. Nausea and vomiting during pregnancy prior to [redacted] weeks gestation     PLAN Discharge home in stable condition. Hyperemesis Precautions Rx Phenergan. Follow-up Information    [facility] Follow up on 04/07/2016.   Why:  for New OB visit Contact information: [place] [phone] [place] .   Why:  as needed in emergencies Contact information: [address] [id number] [place] [phone]           Medication List    STOP taking these medications   ibuprofen 600 MG tablet Commonly known as:  ADVIL,MOTRIN   metroNIDAZOLE 500 MG tablet Commonly known as:  FLAGYL  TAKE these medications   acetaminophen 500 MG tablet Commonly known as:  TYLENOL Take 500 mg by mouth daily as needed for mild pain or headache.   FLINSTONES GUMMIES OMEGA-3 DHA Chew Chew 2 each by mouth every morning.   metoCLOPramide 10 MG tablet Commonly known as:  REGLAN Take 1 tablet (10 mg total) by mouth 4 (four) times daily as needed for nausea or vomiting.   promethazine 25 MG tablet Commonly known as:  PHENERGAN Take 1 tablet (25 mg total) by mouth every 6 (six) hours as needed.   Vitamin D (Ergocalciferol) 50000 units Caps capsule Commonly known as:  DRISDOL Take 1 capsule (50,000 Units total) by mouth every 7 (seven) days.        [place], CNM 04/01/2016  12:12 AM

## 2016-04-02 ENCOUNTER — Encounter: Payer: Self-pay | Admitting: Obstetrics & Gynecology

## 2016-04-07 ENCOUNTER — Encounter: Payer: Self-pay | Admitting: Obstetrics and Gynecology

## 2016-04-07 ENCOUNTER — Ambulatory Visit (INDEPENDENT_AMBULATORY_CARE_PROVIDER_SITE_OTHER): Payer: Medicaid Other | Admitting: Obstetrics and Gynecology

## 2016-04-07 VITALS — BP 109/73 | HR 112

## 2016-04-07 DIAGNOSIS — Z720 Tobacco use: Secondary | ICD-10-CM | POA: Insufficient documentation

## 2016-04-07 DIAGNOSIS — Z3491 Encounter for supervision of normal pregnancy, unspecified, first trimester: Secondary | ICD-10-CM

## 2016-04-07 DIAGNOSIS — Z3490 Encounter for supervision of normal pregnancy, unspecified, unspecified trimester: Secondary | ICD-10-CM

## 2016-04-07 DIAGNOSIS — R899 Unspecified abnormal finding in specimens from other organs, systems and tissues: Secondary | ICD-10-CM | POA: Insufficient documentation

## 2016-04-07 DIAGNOSIS — M069 Rheumatoid arthritis, unspecified: Secondary | ICD-10-CM | POA: Insufficient documentation

## 2016-04-07 NOTE — Progress Notes (Signed)
Subjective:  Eileen [name] is a 25 y.o. [phone] at [redacted]w[redacted]d being seen today for initial prenatal care.  She is currently monitored for the following issues for this high-risk pregnancy and has Goiter, unspecified; Supervision of normal pregnancy; Anxiety; Depression with anxiety; Rheumatoid arthritis ([place]); and Tobacco abuse on her problem list.  Patient reports backache and nausea. Pt reports a H/O RA with chronic back pain. Previously on Lyrical and Cymbalta. PCP stopped with pregnancy.   . Vag. Bleeding: None.   . Denies leaking of fluid.   The following portions of the patient's history were reviewed and updated as appropriate: allergies, current medications, past family history, past medical history, past social history, past surgical history and problem list. Problem list updated.  Objective:   Vitals:   04/07/16 0934  BP: 109/73  Pulse: (!) 112    Fetal Status: Fetal Heart Rate (bpm): 170         General:  Alert, oriented and cooperative. Patient is in no acute distress.  Skin: Skin is warm and dry. No rash noted.   Cardiovascular: Normal heart rate noted  Respiratory: Normal respiratory effort, no problems with respiration noted  Abdomen: Soft, gravid, appropriate for gestational age. Pain/Pressure: Absent     Pelvic:  Cervical exam performed        Extremities: Normal range of motion.     Mental Status: Normal mood and affect. Normal behavior. Normal judgment and thought content.   Urinalysis: Urine Protein: Negative Urine Glucose: Negative  Assessment and Plan:  Pregnancy: [id number] at [redacted]w[redacted]d  1. Supervision of normal pregnancy, first trimester  - Prenatal Profile I - HIV antibody (with reflex) - CULTURE, URINE COMPREHENSIVE - Opiate, quantitative, urine - Pap Lb, rfx HPV ASCU - GC/Chlamydia Probe Amp  2. Prenatal care, unspecified trimester   3. Rheumatoid arthritis, involving unspecified site, unspecified rheumatoid factor presence ([place]) Will add ANA to labs. Pt  informed it was best not to take any medication during the first trimester if at all possible. Pt is going to try this. She was also advised to discuss with PCP what is best treatment options for her Ra. We then could discuss risk of medications and pregnancy.  4. Tobacco abuse Advised to quit  Preterm labor symptoms and general obstetric precautions including but not limited to vaginal bleeding, contractions, leaking of fluid and fetal movement were reviewed in detail with the patient. Please refer to After Visit Summary for other counseling recommendations.  Return in about 4 weeks (around 05/05/2016) for OB visit.   Chancy Milroy, MD

## 2016-04-07 NOTE — Patient Instructions (Signed)
First Trimester of Pregnancy The first trimester of pregnancy is from week 1 until the end of week 12 (months 1 through 3). A week after a sperm fertilizes an egg, the egg will implant on the wall of the uterus. This embryo will begin to develop into a baby. Genes from you and your partner are forming the baby. The female genes determine whether the baby is a boy or a girl. At 6-8 weeks, the eyes and face are formed, and the heartbeat can be seen on ultrasound. At the end of 12 weeks, all the baby's organs are formed.  Now that you are pregnant, you will want to do everything you can to have a healthy baby. Two of the most important things are to get good prenatal care and to follow your health care provider's instructions. Prenatal care is all the medical care you receive before the baby's birth. This care will help prevent, find, and treat any problems during the pregnancy and childbirth. BODY CHANGES Your body goes through many changes during pregnancy. The changes vary from woman to woman.   You may gain or lose a couple of pounds at first.  You may feel sick to your stomach (nauseous) and throw up (vomit). If the vomiting is uncontrollable, call your health care provider.  You may tire easily.  You may develop headaches that can be relieved by medicines approved by your health care provider.  You may urinate more often. Painful urination may mean you have a bladder infection.  You may develop heartburn as a result of your pregnancy.  You may develop constipation because certain hormones are causing the muscles that push waste through your intestines to slow down.  You may develop hemorrhoids or swollen, bulging veins (varicose veins).  Your breasts may begin to grow larger and become tender. Your nipples may stick out more, and the tissue that surrounds them (areola) may become darker.  Your gums may bleed and may be sensitive to brushing and flossing.  Dark spots or blotches (chloasma,  mask of pregnancy) may develop on your face. This will likely fade after the baby is born.  Your menstrual periods will stop.  You may have a loss of appetite.  You may develop cravings for certain kinds of food.  You may have changes in your emotions from day to day, such as being excited to be pregnant or being concerned that something may go wrong with the pregnancy and baby.  You may have more vivid and strange dreams.  You may have changes in your hair. These can include thickening of your hair, rapid growth, and changes in texture. Some women also have hair loss during or after pregnancy, or hair that feels dry or thin. Your hair will most likely return to normal after your baby is born. WHAT TO EXPECT AT YOUR PRENATAL VISITS During a routine prenatal visit:  You will be weighed to make sure you and the baby are growing normally.  Your blood pressure will be taken.  Your abdomen will be measured to track your baby's growth.  The fetal heartbeat will be listened to starting around week 10 or 12 of your pregnancy.  Test results from any previous visits will be discussed. Your health care provider may ask you:  How you are feeling.  If you are feeling the baby move.  If you have had any abnormal symptoms, such as leaking fluid, bleeding, severe headaches, or abdominal cramping.  If you are using any tobacco products,   including cigarettes, chewing tobacco, and electronic cigarettes.  If you have any questions. Other tests that may be performed during your first trimester include:  Blood tests to find your blood type and to check for the presence of any previous infections. They will also be used to check for low iron levels (anemia) and Rh antibodies. Later in the pregnancy, blood tests for diabetes will be done along with other tests if problems develop.  Urine tests to check for infections, diabetes, or protein in the urine.  An ultrasound to confirm the proper growth  and development of the baby.  An amniocentesis to check for possible genetic problems.  Fetal screens for spina bifida and Down syndrome.  You may need other tests to make sure you and the baby are doing well.  HIV (human immunodeficiency virus) testing. Routine prenatal testing includes screening for HIV, unless you choose not to have this test. HOME CARE INSTRUCTIONS  Medicines  Follow your health care provider's instructions regarding medicine use. Specific medicines may be either safe or unsafe to take during pregnancy.  Take your prenatal vitamins as directed.  If you develop constipation, try taking a stool softener if your health care provider approves. Diet  Eat regular, well-balanced meals. Choose a variety of foods, such as meat or vegetable-based protein, fish, milk and low-fat dairy products, vegetables, fruits, and whole grain breads and cereals. Your health care provider will help you determine the amount of weight gain that is right for you.  Avoid raw meat and uncooked cheese. These carry germs that can cause birth defects in the baby.  Eating four or five small meals rather than three large meals a day may help relieve nausea and vomiting. If you start to feel nauseous, eating a few soda crackers can be helpful. Drinking liquids between meals instead of during meals also seems to help nausea and vomiting.  If you develop constipation, eat more high-fiber foods, such as fresh vegetables or fruit and whole grains. Drink enough fluids to keep your urine clear or pale yellow. Activity and Exercise  Exercise only as directed by your health care provider. Exercising will help you:  Control your weight.  Stay in shape.  Be prepared for labor and delivery.  Experiencing pain or cramping in the lower abdomen or low back is a good sign that you should stop exercising. Check with your health care provider before continuing normal exercises.  Try to avoid standing for long  periods of time. Move your legs often if you must stand in one place for a long time.  Avoid heavy lifting.  Wear low-heeled shoes, and practice good posture.  You may continue to have sex unless your health care provider directs you otherwise. Relief of Pain or Discomfort  Wear a good support bra for breast tenderness.   Take warm sitz baths to soothe any pain or discomfort caused by hemorrhoids. Use hemorrhoid cream if your health care provider approves.   Rest with your legs elevated if you have leg cramps or low back pain.  If you develop varicose veins in your legs, wear support hose. Elevate your feet for 15 minutes, 3-4 times a day. Limit salt in your diet. Prenatal Care  Schedule your prenatal visits by the twelfth week of pregnancy. They are usually scheduled monthly at first, then more often in the last 2 months before delivery.  Write down your questions. Take them to your prenatal visits.  Keep all your prenatal visits as directed by your   health care provider. Safety  Wear your seat belt at all times when driving.  Make a list of emergency phone numbers, including numbers for family, friends, the hospital, and police and fire departments. General Tips  Ask your health care provider for a referral to a local prenatal education class. Begin classes no later than at the beginning of month 6 of your pregnancy.  Ask for help if you have counseling or nutritional needs during pregnancy. Your health care provider can offer advice or refer you to specialists for help with various needs.  Do not use hot tubs, steam rooms, or saunas.  Do not douche or use tampons or scented sanitary pads.  Do not cross your legs for long periods of time.  Avoid cat litter boxes and soil used by cats. These carry germs that can cause birth defects in the baby and possibly loss of the fetus by miscarriage or stillbirth.  Avoid all smoking, herbs, alcohol, and medicines not prescribed by  your health care provider. Chemicals in these affect the formation and growth of the baby.  Do not use any tobacco products, including cigarettes, chewing tobacco, and electronic cigarettes. If you need help quitting, ask your health care provider. You may receive counseling support and other resources to help you quit.  Schedule a dentist appointment. At home, brush your teeth with a soft toothbrush and be gentle when you floss. SEEK MEDICAL CARE IF:   You have dizziness.  You have mild pelvic cramps, pelvic pressure, or nagging pain in the abdominal area.  You have persistent nausea, vomiting, or diarrhea.  You have a bad smelling vaginal discharge.  You have pain with urination.  You notice increased swelling in your face, hands, legs, or ankles. SEEK IMMEDIATE MEDICAL CARE IF:   You have a fever.  You are leaking fluid from your vagina.  You have spotting or bleeding from your vagina.  You have severe abdominal cramping or pain.  You have rapid weight gain or loss.  You vomit blood or material that looks like coffee grounds.  You are exposed to German measles and have never had them.  You are exposed to fifth disease or chickenpox.  You develop a severe headache.  You have shortness of breath.  You have any kind of trauma, such as from a fall or a car accident.   This information is not intended to replace advice given to you by your health care provider. Make sure you discuss any questions you have with your health care provider.   Document Released: 07/07/2001 Document Revised: 08/03/2014 Document Reviewed: 05/23/2013 Elsevier Interactive Patient Education 2016 [employer]  

## 2016-04-08 LAB — ANA: Anti Nuclear Antibody(ANA): NEGATIVE

## 2016-04-09 LAB — GC/CHLAMYDIA PROBE AMP
CHLAMYDIA, DNA PROBE: NEGATIVE
Neisseria gonorrhoeae by PCR: NEGATIVE

## 2016-04-10 LAB — PAP LB, RFX HPV ASCU: PAP Smear Comment: 0

## 2016-04-10 LAB — CULTURE, URINE COMPREHENSIVE

## 2016-04-15 ENCOUNTER — Telehealth: Payer: Self-pay | Admitting: *Deleted

## 2016-04-15 LAB — PRENATAL PROFILE I(LABCORP)
Antibody Screen: NEGATIVE
BASOS ABS: 0 10*3/uL (ref 0.0–0.2)
Basos: 0 %
EOS (ABSOLUTE): 0.1 10*3/uL (ref 0.0–0.4)
Eos: 1 %
Hematocrit: 34.6 % (ref 34.0–46.6)
Hemoglobin: 11.5 g/dL (ref 11.1–15.9)
Hepatitis B Surface Ag: NEGATIVE
Immature Grans (Abs): 0 10*3/uL (ref 0.0–0.1)
Immature Granulocytes: 0 %
Lymphocytes Absolute: 2.2 10*3/uL (ref 0.7–3.1)
Lymphs: 29 %
MCH: 26.8 pg (ref 26.6–33.0)
MCHC: 33.2 g/dL (ref 31.5–35.7)
MCV: 81 fL (ref 79–97)
MONOCYTES: 8 %
Monocytes Absolute: 0.6 10*3/uL (ref 0.1–0.9)
NEUTROS ABS: 4.7 10*3/uL (ref 1.4–7.0)
Neutrophils: 62 %
PLATELETS: 179 10*3/uL (ref 150–379)
RBC: 4.29 x10E6/uL (ref 3.77–5.28)
RDW: 16.6 % — AB (ref 12.3–15.4)
RPR Ser Ql: NONREACTIVE
Rh Factor: POSITIVE
Rubella Antibodies, IGG: 12.2 index (ref >0.99)
WBC: 7.6 10*3/uL (ref 3.4–10.8)

## 2016-04-15 LAB — OPIATE, QUANTITATIVE, URINE
OPIATES: NEGATIVE
OXYCODONE+OXYMORPHONE UR QL SCN: NEGATIVE

## 2016-04-15 LAB — HIV ANTIBODY (ROUTINE TESTING W REFLEX): HIV Screen 4th Generation wRfx: NONREACTIVE

## 2016-04-15 NOTE — Telephone Encounter (Signed)
Left message for patient to call us back.  

## 2016-04-15 NOTE — Telephone Encounter (Signed)
-----   Message from Chancy Milroy, MD sent at 04/13/2016  8:24 AM EDT ----- Please notify pt of negative test results Thanks Legrand Como ----- Message ----- From: Lavone Neri Lab Results In Sent: 04/08/2016   6:36 AM To: Chancy Milroy, MD

## 2016-04-15 NOTE — Telephone Encounter (Signed)
Attempted to call patient. No answer, voice mail was left stating I am calling with non urgent test results. Please return my call at the clinic.

## 2016-04-20 NOTE — Telephone Encounter (Signed)
Pt left message on 9/22 @ 0929 stating that she was returning our call regarding results. I returned the call today and spoke with pt briefly before her cell phone lost the signal. When I called back I got her personal voice mail and left a message stating that all of her test results were normal.

## 2016-05-05 ENCOUNTER — Ambulatory Visit (INDEPENDENT_AMBULATORY_CARE_PROVIDER_SITE_OTHER): Payer: Medicaid Other | Admitting: Obstetrics & Gynecology

## 2016-05-05 DIAGNOSIS — Z3481 Encounter for supervision of other normal pregnancy, first trimester: Secondary | ICD-10-CM

## 2016-05-05 DIAGNOSIS — Z3480 Encounter for supervision of other normal pregnancy, unspecified trimester: Secondary | ICD-10-CM

## 2016-05-05 MED ORDER — DULOXETINE HCL 30 MG PO CPEP: 30.0000 mg | ORAL_CAPSULE | Freq: Every day | ORAL | 3 refills | Status: DC

## 2016-05-05 MED ORDER — ONDANSETRON 4 MG PO TBDP: 4.0000 mg | ORAL_TABLET | Freq: Four times a day (QID) | ORAL | 0 refills | Status: DC | PRN

## 2016-05-05 NOTE — Progress Notes (Signed)
Patient is in office and states that she has had nausea and vomiting often and phenergan did not work.

## 2016-05-05 NOTE — Progress Notes (Signed)
   PRENATAL VISIT NOTE  Subjective:  Eileen [name] is a 25 y.o. [phone] at [redacted]w[redacted]d being seen today for ongoing prenatal care.  She is currently monitored for the following issues for this high-risk pregnancy and has Goiter, unspecified; Supervision of normal pregnancy; Anxiety; Depression with anxiety; Rheumatoid arthritis ([place]); and Tobacco abuse on her problem list.  Patient reports nausea and vomiting.  Contractions: Not present. Vag. Bleeding: None.   . Denies leaking of fluid.   The following portions of the patient's history were reviewed and updated as appropriate: allergies, current medications, past family history, past medical history, past social history, past surgical history and problem list. Problem list updated.  Objective:   Vitals:   05/05/16 1042  BP: 117/79  Pulse: (!) 114  Temp: 98.2 F (36.8 C)  Weight: 117 lb 6.4 oz (53.3 kg)    Fetal Status:           General:  Alert, oriented and cooperative. Patient is in no acute distress.  Skin: Skin is warm and dry. No rash noted.   Cardiovascular: Normal heart rate noted  Respiratory: Normal respiratory effort, no problems with respiration noted  Abdomen: Soft, gravid, appropriate for gestational age. Pain/Pressure: Absent     Pelvic:  Cervical exam deferred        Extremities: Normal range of motion.  Edema: None  Mental Status: Normal mood and affect. Normal behavior. Normal judgment and thought content.   Urinalysis: Urine Protein: Trace Urine Glucose: Negative  Assessment and Plan:  Pregnancy: [id number] at [redacted]w[redacted]d  1. Supervision of other normal pregnancy, antepartum Pain has been worse since d/c Cymbalta, which could be stopped in 3rd trimester but I will consult MFM for their recommendation - ondansetron (ZOFRAN ODT) 4 MG disintegrating tablet; Take 1 tablet (4 mg total) by mouth every 6 (six) hours as needed for nausea.  Dispense: 20 tablet; Refill: 0 - DULoxetine (CYMBALTA) 30 MG capsule; Take 1 capsule (30 mg  total) by mouth daily.  Dispense: 30 capsule; Refill: 3 - MaterniT21 PLUS Core - [place] MFM OB COMP + 14 WK; Future - AMB referral to maternal fetal medicine  Preterm labor symptoms and general obstetric precautions including but not limited to vaginal bleeding, contractions, leaking of fluid and fetal movement were reviewed in detail with the patient. Please refer to After Visit Summary for other counseling recommendations.  Return in about 4 weeks (around 06/02/2016).  Woodroe Mode, MD

## 2016-05-09 IMAGING — US US ABDOMEN COMPLETE
1 series · 14 of 25 positions shown · non-contrast
Comparison: None.

CLINICAL DATA: Right upper quadrant pain

EXAM:
ULTRASOUND ABDOMEN COMPLETE

[Series 1: us abdomen complete · 0.17mm/px · 14 of 60 slices shown]
[im 1/60]
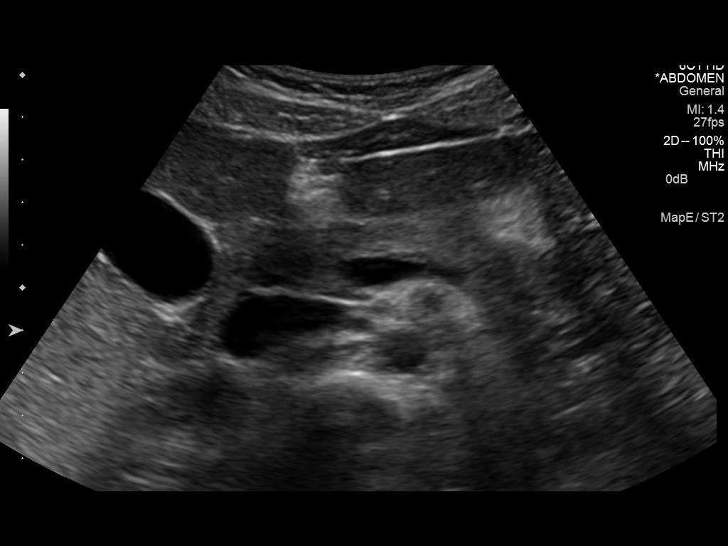
[im 5/60]
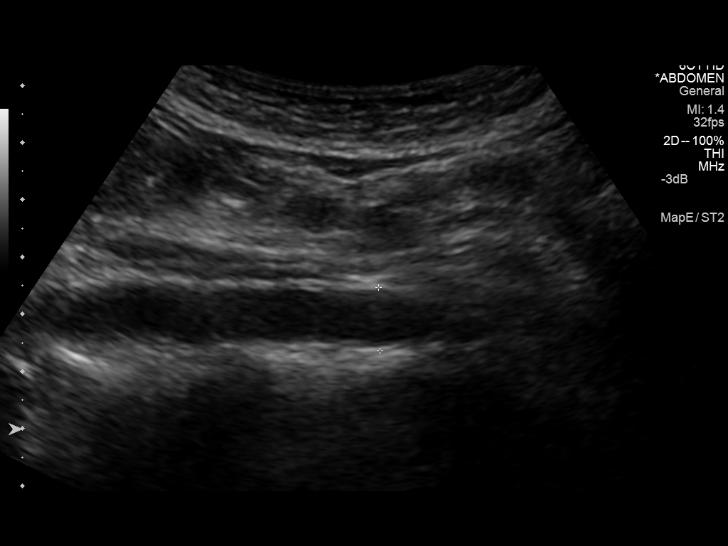
[im 10/60]
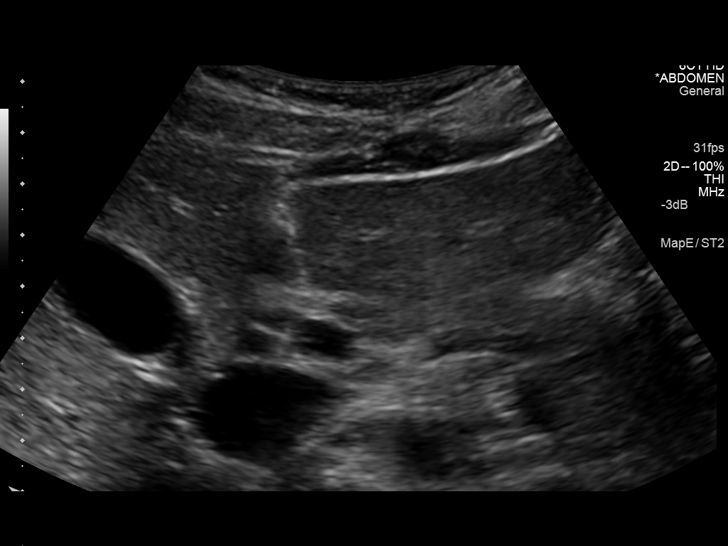
[im 15/60]
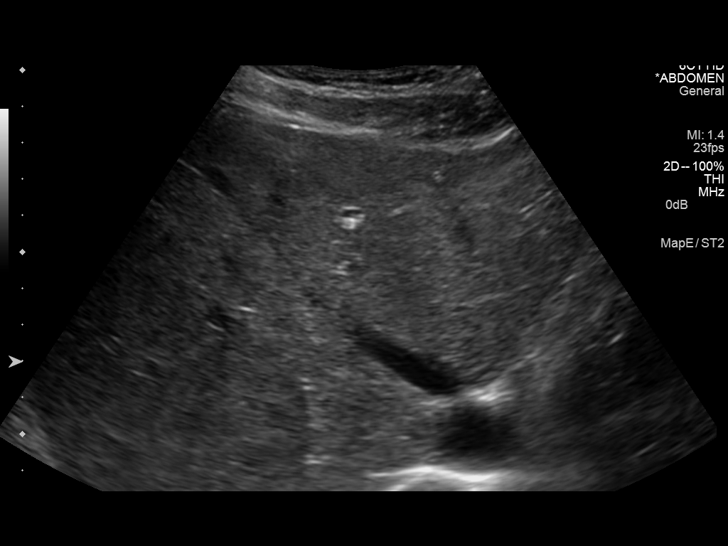
[im 20/60]
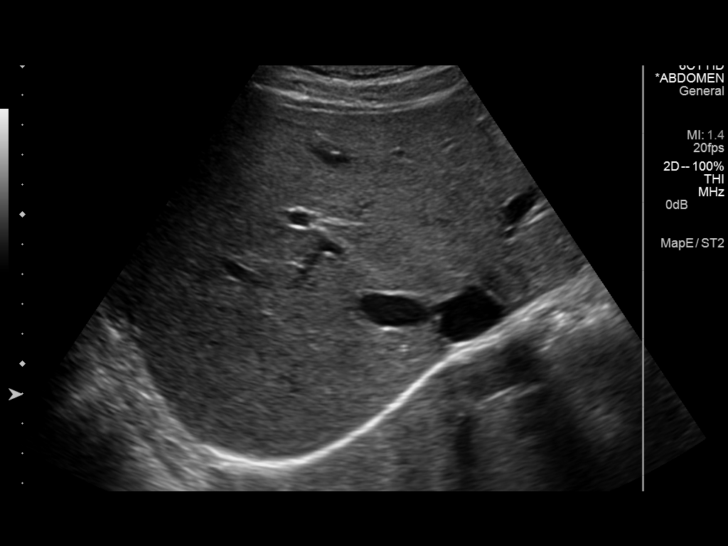
[im 23/60]
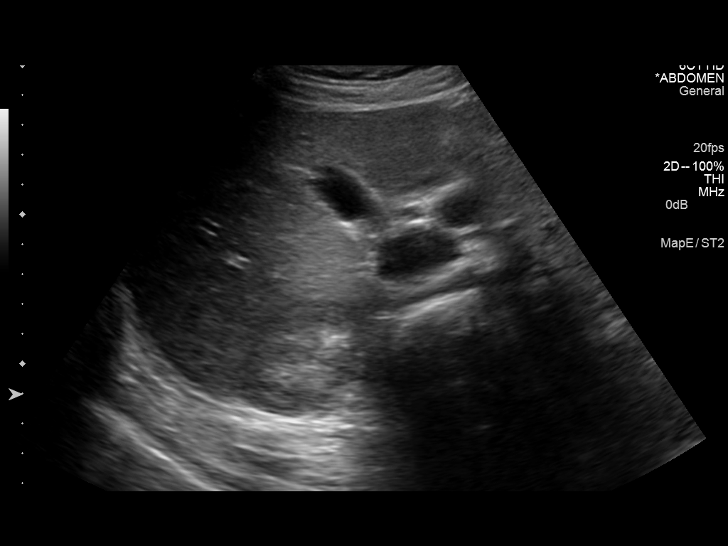
[im 28/60]
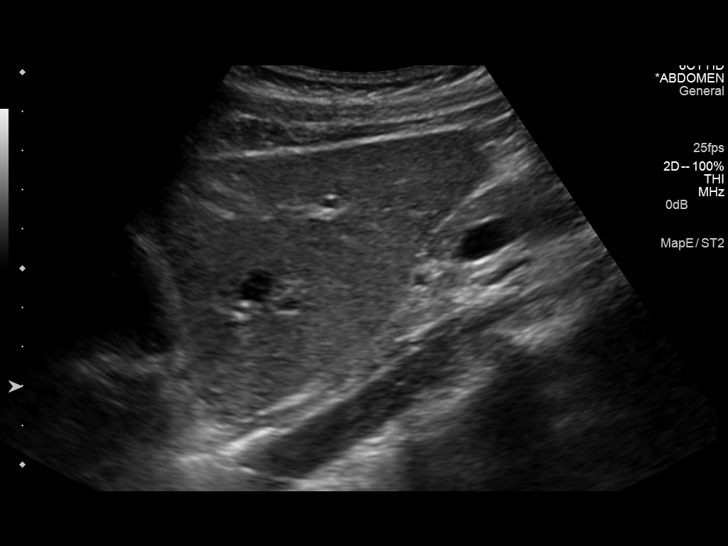
[im 32/60]
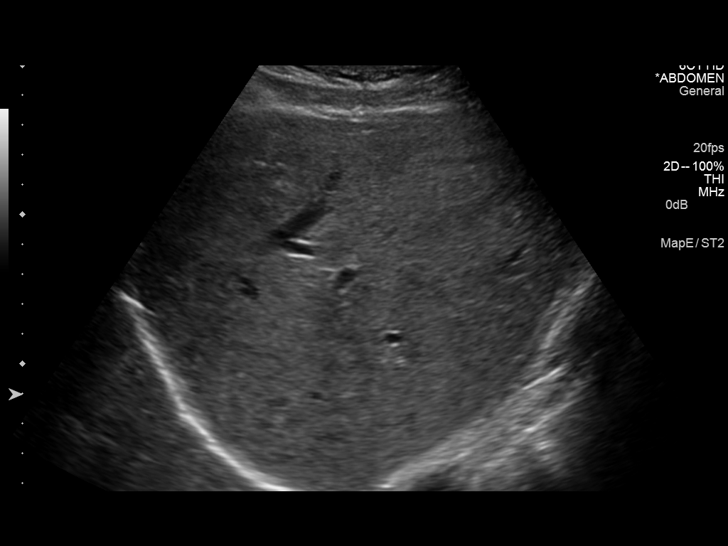
[im 37/60]
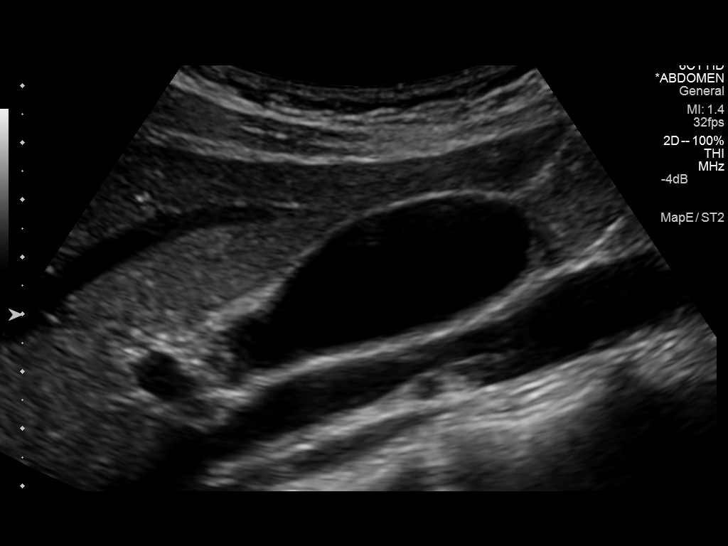
[im 40/60]
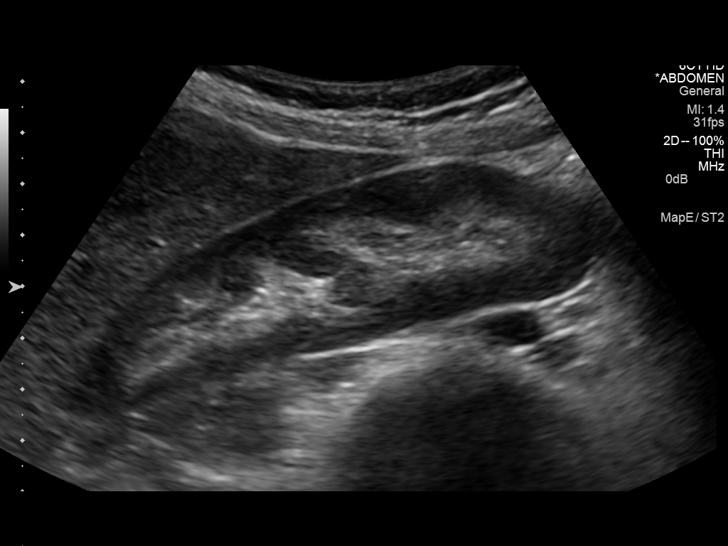
[im 45/60]
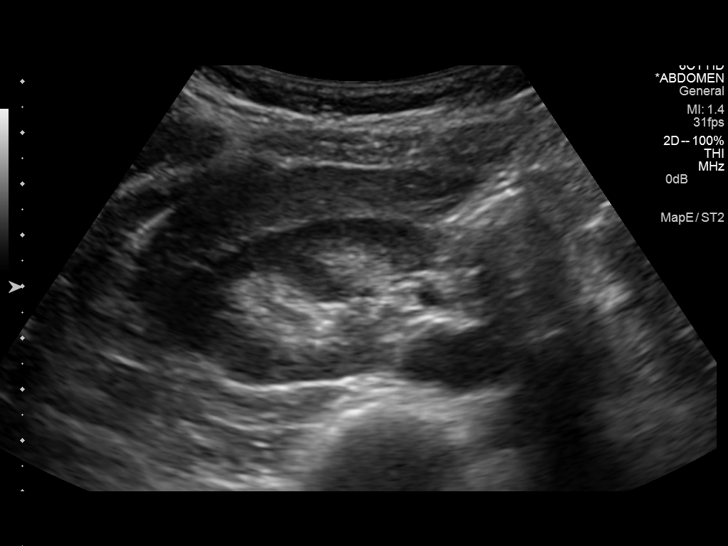
[im 50/60]
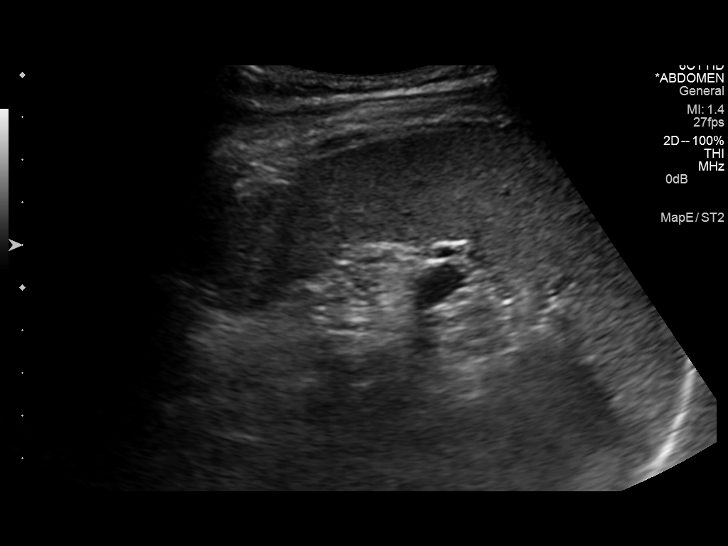
[im 55/60]
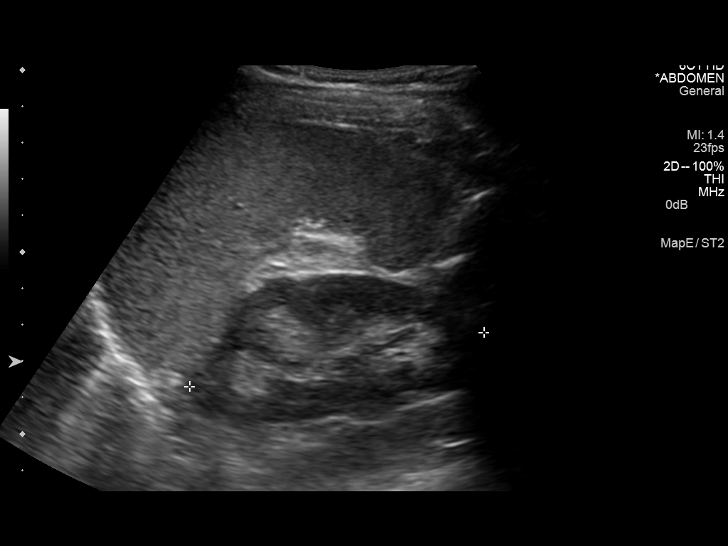
[im 60/60]
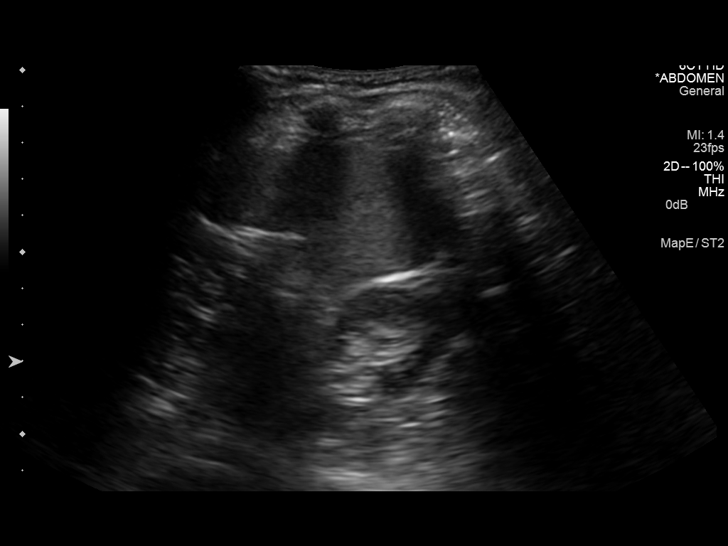

[14 of 25 positions shown; findings below may reference images not displayed]

FINDINGS: Gallbladder:

No gallstones or wall thickening visualized. No sonographic Murphy
sign noted.

Common bile duct:

Diameter: 3.3 mm

Liver:

No focal lesion identified. Within normal limits in parenchymal
echogenicity.

IVC:

No abnormality visualized.

Pancreas:

Visualized portion unremarkable.

Spleen:

Size and appearance within normal limits.

Right Kidney:

Length: 10.9 cm. Echogenicity within normal limits. No mass or
hydronephrosis visualized.

Left Kidney:

Length: A 0.27. Echogenicity within normal limits. No mass or
hydronephrosis visualized.

Abdominal aorta:

No aneurysm visualized.

Other findings:

None.
IMPRESSION: No acute abnormality.

## 2016-05-11 ENCOUNTER — Telehealth: Payer: Self-pay | Admitting: *Deleted

## 2016-05-11 NOTE — Telephone Encounter (Signed)
Patient is calling to see when her Maternity 21 results will be back- call to patient - 2 weeks. Patient states she has been having some intermittent sharp pain down low. It comes and goes and is not consistent with anything she has ever felt with her previous pregnancies. Discussed ligament pain vs. Contraction pain. Signs of preterm labor. Advised patient that it might not be a bad idea the next time she has this discomfort- or if it gets worse or more frequent to get it checked at [facility]. Preterm labor can happen at any time. She understands- she will also increase her fluid intake and use a stool softener to make sure she is doing everything she can to keep her abdomen calm.

## 2016-05-12 ENCOUNTER — Encounter (HOSPITAL_COMMUNITY): Payer: Self-pay | Admitting: *Deleted

## 2016-05-12 ENCOUNTER — Inpatient Hospital Stay (HOSPITAL_COMMUNITY)
Admission: AD | Admit: 2016-05-12 | Discharge: 2016-05-12 | Disposition: A | Discharge status: Home / Self Care | Payer: Medicaid Other | Source: Ambulatory Visit | Attending: Obstetrics & Gynecology | Admitting: Obstetrics & Gynecology

## 2016-05-12 DIAGNOSIS — O26891 Other specified pregnancy related conditions, first trimester: Secondary | ICD-10-CM | POA: Insufficient documentation

## 2016-05-12 DIAGNOSIS — F17210 Nicotine dependence, cigarettes, uncomplicated: Secondary | ICD-10-CM | POA: Insufficient documentation

## 2016-05-12 DIAGNOSIS — R109 Unspecified abdominal pain: Secondary | ICD-10-CM

## 2016-05-12 DIAGNOSIS — O26899 Other specified pregnancy related conditions, unspecified trimester: Secondary | ICD-10-CM | POA: Diagnosis not present

## 2016-05-12 DIAGNOSIS — Z3A13 13 weeks gestation of pregnancy: Secondary | ICD-10-CM | POA: Diagnosis not present

## 2016-05-12 DIAGNOSIS — O99331 Smoking (tobacco) complicating pregnancy, first trimester: Secondary | ICD-10-CM | POA: Diagnosis not present

## 2016-05-12 HISTORY — DX: Rheumatoid arthritis, unspecified: M06.9

## 2016-05-12 LAB — URINALYSIS, ROUTINE W REFLEX MICROSCOPIC
Bilirubin Urine: NEGATIVE
Glucose, UA: NEGATIVE mg/dL
HGB URINE DIPSTICK: NEGATIVE
Ketones, ur: NEGATIVE mg/dL
LEUKOCYTES UA: NEGATIVE
NITRITE: NEGATIVE
PROTEIN: NEGATIVE mg/dL
Specific Gravity, Urine: <1.005 — ABNORMAL LOW (ref 1.005–1.030)
pH: 6.5 (ref 5.0–8.0)

## 2016-05-12 LAB — MATERNIT 21 PLUS CORE, BLOOD
Chromosome 13: NEGATIVE
Chromosome 18: NEGATIVE
Chromosome 21: NEGATIVE
PDF: 0
Y CHROMOSOME: DETECTED

## 2016-05-12 LAB — WET PREP, GENITAL
CLUE CELLS WET PREP: NONE SEEN
SPERM: NONE SEEN
Trich, Wet Prep: NONE SEEN
Yeast Wet Prep HPF POC: NONE SEEN

## 2016-05-12 NOTE — Discharge Instructions (Signed)

## 2016-05-12 NOTE — [facility] Provider Note (Signed)
History     CSN: [id number]  Arrival date and time: 05/12/16 [id number]   First Provider Initiated Contact with Patient 05/12/16 2031      Chief Complaint  Patient presents with  . Abdominal Pain   HPI  Eileen [name] is a 25 y.o. [id number] at [redacted]w[redacted]d who presents with abdominal pain. Symptoms began 3 days ago. Reports intermittent lower abdominal pain that occurs about 7 times per day and lasts 30 seconds at a time. Rates pain 6/10 when it comes. Has not treated. Nothing makes better or worse. Denies vaginal bleeding, n/v/d, fever/chills, dysuria, LOF, or vaginal discharge. Unsure of last BM but states she normally only goes twice per week.   OB History    Gravida Para Term Preterm AB Living   6 3 3  0 2 3   SAB TAB Ectopic Multiple Live Births     2 0 0 3      Past Medical History:  Diagnosis Date  . Abortion 08/04/2011  . Allergy   . Anemia   . Anxiety   . Chronic kidney disease    kidney infection.  . Dysuria   . Headache(784.0)   . Hypothyroid    history of.  . Mitral valve prolapse   . RA (rheumatoid arthritis) ([place])     Past Surgical History:  Procedure Laterality Date  . LAPAROSCOPY N/A 05/25/2014   Procedure: LAPAROSCOPY DIAGNOSTIC;  Surgeon: Osborne Oman, MD;  Location: [facility];  Service: Gynecology;  Laterality: N/A;  . THERAPEUTIC ABORTION N/A 2012    Family History  Problem Relation Age of Onset  . Cancer Mother     SKIN  . Hyperlipidemia Mother   . Thyroid disease Mother   . Heart disease Mother   . Asthma Brother   . Diabetes Maternal Grandmother   . Hyperlipidemia Maternal Grandmother   . Emphysema Paternal Grandfather     Social History  Substance Use Topics  . Smoking status: Current Every Day Smoker    Packs/day: 0.50  . Smokeless tobacco: Never Used  . Alcohol use 0.0 oz/week     Comment: occasional but not while pregnant    Allergies:  Allergies  Allergen Reactions  . Eggs Or Egg-Derived Products Nausea Only  . Other Hives and Nausea  And Vomiting    Green beans  . Red Dye Nausea And Vomiting  . Sulfa Antibiotics Hives and Nausea And Vomiting    Childhood reaction    Prescriptions Prior to Admission  Medication Sig Dispense Refill Last Dose  . acetaminophen (TYLENOL) 500 MG tablet Take 500 mg by mouth daily as needed for mild pain or headache.    Not Taking  . DULoxetine (CYMBALTA) 30 MG capsule Take 1 capsule (30 mg total) by mouth daily. 30 capsule 3   . ondansetron (ZOFRAN ODT) 4 MG disintegrating tablet Take 1 tablet (4 mg total) by mouth every 6 (six) hours as needed for nausea. 20 tablet 0   . Pediatric Multiple Vit-C-FA (FLINSTONES GUMMIES OMEGA-3 DHA) CHEW Chew 2 each by mouth every morning.   Taking  . promethazine (PHENERGAN) 25 MG tablet Take 1 tablet (25 mg total) by mouth every 6 (six) hours as needed. (Patient not taking: Reported on 05/05/2016) 30 tablet 1 Not Taking    Review of Systems  Constitutional: Negative.   Gastrointestinal: Positive for abdominal pain. Negative for constipation, diarrhea, nausea and vomiting.  Genitourinary: Negative.    Physical Exam   Blood pressure 95/67, pulse 92, temperature 98 F (  36.7 C), temperature source Oral, resp. rate 18, last menstrual period 02/05/2016, unknown if currently breastfeeding.  Physical Exam  Nursing note and vitals reviewed. Constitutional: She is oriented to person, place, and time. She appears well-developed and well-nourished. No distress.  HENT:  Head: Normocephalic and atraumatic.  Eyes: Conjunctivae are normal. Right eye exhibits no discharge. Left eye exhibits no discharge. No scleral icterus.  Neck: Normal range of motion.  Cardiovascular: Normal rate, regular rhythm and normal heart sounds.   No murmur heard. Respiratory: Effort normal and breath sounds normal. No respiratory distress. She has no wheezes.  GI: Soft. Bowel sounds are normal. There is no tenderness. There is no rebound and no guarding.  Genitourinary:  Genitourinary  Comments: Cervix closed  Neurological: She is alert and oriented to person, place, and time.  Skin: Skin is warm and dry. She is not diaphoretic.  Psychiatric: She has a normal mood and affect. Her behavior is normal. Judgment and thought content normal.    [facility] Course  Procedures Results for orders placed or performed during the hospital encounter of 05/12/16 (from the past 24 hour(s))  Urinalysis, Routine w reflex microscopic (not at [facility])     Status: Abnormal   Collection Time: 05/12/16  8:00 PM  Result Value Ref Range   Color, Urine YELLOW YELLOW   APPearance HAZY (A) CLEAR   Specific Gravity, Urine <1.005 (L) 1.005 - 1.030   pH 6.5 5.0 - 8.0   Glucose, UA NEGATIVE NEGATIVE mg/dL   Hgb urine dipstick NEGATIVE NEGATIVE   Bilirubin Urine NEGATIVE NEGATIVE   Ketones, ur NEGATIVE NEGATIVE mg/dL   Protein, ur NEGATIVE NEGATIVE mg/dL   Nitrite NEGATIVE NEGATIVE   Leukocytes, UA NEGATIVE NEGATIVE  Wet prep, genital     Status: Abnormal   Collection Time: 05/12/16  8:40 PM  Result Value Ref Range   Yeast Wet Prep HPF POC NONE SEEN NONE SEEN   Trich, Wet Prep NONE SEEN NONE SEEN   Clue Cells Wet Prep HPF POC NONE SEEN NONE SEEN   WBC, Wet Prep HPF POC FEW (A) NONE SEEN   Sperm NONE SEEN     MDM FHT 152 by doppler Cervix closed U/a normal GC/ct & wet prep collected  Assessment and Plan  A; 1. Abdominal pain affecting pregnancy    P: Discharge home Discussed reasons to return to [facility]U Keep f/u with OB GC/CT pending  Jorje Guild 05/12/2016, 8:31 PM

## 2016-05-12 NOTE — [facility]U Note (Signed)
Pt presents complaining of sporadic sharp pains in her lower abdomen for 3 days. Also having lower back pain. Has not tried any pain medication. States she feels like they are contractions. Denies vaginal bleeding or leaking of fluid.

## 2016-05-13 LAB — GC/CHLAMYDIA PROBE AMP (~~LOC~~) NOT AT ARMC
CHLAMYDIA, DNA PROBE: NEGATIVE
Neisseria Gonorrhea: NEGATIVE

## 2016-05-20 ENCOUNTER — Telehealth: Payer: Self-pay | Admitting: *Deleted

## 2016-05-20 NOTE — Telephone Encounter (Signed)
Patient wants to know if her provider can call in another Rx for Zofran. It is the only thing that seems to work for her nausea and vomiting.

## 2016-05-21 ENCOUNTER — Other Ambulatory Visit: Payer: Self-pay | Admitting: *Deleted

## 2016-05-21 DIAGNOSIS — Z3480 Encounter for supervision of other normal pregnancy, unspecified trimester: Secondary | ICD-10-CM

## 2016-05-21 MED ORDER — ONDANSETRON 4 MG PO TBDP: 4.0000 mg | ORAL_TABLET | Freq: Four times a day (QID) | ORAL | 0 refills | Status: DC | PRN

## 2016-05-21 NOTE — Telephone Encounter (Signed)
Refill approved.

## 2016-05-21 NOTE — Telephone Encounter (Signed)
Patient notified

## 2016-06-01 ENCOUNTER — Encounter (HOSPITAL_COMMUNITY): Payer: Self-pay | Admitting: Obstetrics & Gynecology

## 2016-06-03 ENCOUNTER — Encounter: Payer: Medicaid Other | Admitting: Obstetrics & Gynecology

## 2016-06-08 ENCOUNTER — Telehealth: Payer: Self-pay | Admitting: *Deleted

## 2016-06-08 NOTE — Telephone Encounter (Signed)
Patient is requesting Dr Roselie Awkward call in Rx for Zofran. Will forward call to see if Rx is appropriate.

## 2016-06-09 ENCOUNTER — Ambulatory Visit (INDEPENDENT_AMBULATORY_CARE_PROVIDER_SITE_OTHER): Payer: Medicaid Other | Admitting: Obstetrics & Gynecology

## 2016-06-09 ENCOUNTER — Other Ambulatory Visit (HOSPITAL_COMMUNITY)
Admission: RE | Admit: 2016-06-09 | Discharge: 2016-06-09 | Disposition: A | Discharge status: Home / Self Care | Payer: Medicaid Other | Source: Ambulatory Visit | Attending: Obstetrics & Gynecology | Admitting: Obstetrics & Gynecology

## 2016-06-09 DIAGNOSIS — Z113 Encounter for screening for infections with a predominantly sexual mode of transmission: Secondary | ICD-10-CM | POA: Insufficient documentation

## 2016-06-09 DIAGNOSIS — Z3490 Encounter for supervision of normal pregnancy, unspecified, unspecified trimester: Secondary | ICD-10-CM | POA: Insufficient documentation

## 2016-06-09 DIAGNOSIS — Z3480 Encounter for supervision of other normal pregnancy, unspecified trimester: Secondary | ICD-10-CM

## 2016-06-09 DIAGNOSIS — Z3482 Encounter for supervision of other normal pregnancy, second trimester: Secondary | ICD-10-CM

## 2016-06-09 MED ORDER — ONDANSETRON 4 MG PO TBDP: 4.0000 mg | ORAL_TABLET | Freq: Four times a day (QID) | ORAL | 0 refills | Status: DC | PRN

## 2016-06-09 NOTE — Progress Notes (Signed)
Patient complains of nausea today and states that she feels she may be getting a cold, reports feeling fetal movement.

## 2016-06-09 NOTE — Progress Notes (Signed)
Requests STD test, reviewed previous results   PRENATAL VISIT NOTE  Subjective:  Eileen [name] is a 25 y.o. [id number] at [redacted]w[redacted]d being seen today for ongoing prenatal care.  She is currently monitored for the following issues for this low-risk pregnancy and has Goiter, unspecified; Anxiety; Depression with anxiety; Rheumatoid arthritis ([place]); Tobacco abuse; and Supervision of normal pregnancy, antepartum on her problem list.  Patient reports nausea.  Contractions: Not present. Vag. Bleeding: None.  Movement: Present. Denies leaking of fluid.   The following portions of the patient's history were reviewed and updated as appropriate: allergies, current medications, past family history, past medical history, past social history, past surgical history and problem list. Problem list updated.  Objective:   Vitals:   06/09/16 1337  BP: 107/72  Pulse: (!) 109  Temp: 97.7 F (36.5 C)  Weight: 121 lb 12.8 oz (55.2 kg)    Fetal Status: Fetal Heart Rate (bpm): 140   Movement: Present     General:  Alert, oriented and cooperative. Patient is in no acute distress.  Skin: Skin is warm and dry. No rash noted.   Cardiovascular: Normal heart rate noted  Respiratory: Normal respiratory effort, no problems with respiration noted, CTA  Abdomen: Soft, gravid, appropriate for gestational age. Pain/Pressure: Absent     Pelvic:  Cervical exam deferred        Extremities: Normal range of motion.  Edema: None  Mental Status: Normal mood and affect. Normal behavior. Normal judgment and thought content.   Assessment and Plan:  Pregnancy: [id number] at [redacted]w[redacted]d  1. Supervision of other normal pregnancy, antepartum  - ondansetron (ZOFRAN ODT) 4 MG disintegrating tablet; Take 1 tablet (4 mg total) by mouth every 6 (six) hours as needed for nausea.  Dispense: 20 tablet; Refill: 0 - GC/Chlamydia probe amp ([place])not at [facility] - HIV antibody  Preterm labor symptoms and general obstetric precautions including but  not limited to vaginal bleeding, contractions, leaking of fluid and fetal movement were reviewed in detail with the patient. Please refer to After Visit Summary for other counseling recommendations.  Return in about 4 weeks (around 07/07/2016).   Woodroe Mode, MD'

## 2016-06-10 LAB — GC/CHLAMYDIA PROBE AMP (~~LOC~~) NOT AT ARMC
CHLAMYDIA, DNA PROBE: NEGATIVE
NEISSERIA GONORRHEA: NEGATIVE

## 2016-06-10 LAB — HIV ANTIBODY (ROUTINE TESTING W REFLEX): HIV SCREEN 4TH GENERATION: NONREACTIVE

## 2016-06-11 ENCOUNTER — Encounter: Payer: Self-pay | Admitting: *Deleted

## 2016-06-15 ENCOUNTER — Encounter (HOSPITAL_COMMUNITY): Payer: Self-pay | Admitting: *Deleted

## 2016-06-15 ENCOUNTER — Inpatient Hospital Stay (HOSPITAL_COMMUNITY): Payer: Medicaid Other

## 2016-06-15 ENCOUNTER — Inpatient Hospital Stay (HOSPITAL_COMMUNITY)
Admission: AD | Admit: 2016-06-15 | Discharge: 2016-06-15 | Disposition: A | Discharge status: Home / Self Care | Payer: Medicaid Other | Source: Ambulatory Visit | Attending: Family Medicine | Admitting: Family Medicine

## 2016-06-15 ENCOUNTER — Ambulatory Visit (INDEPENDENT_AMBULATORY_CARE_PROVIDER_SITE_OTHER): Payer: Medicaid Other | Admitting: Obstetrics and Gynecology

## 2016-06-15 VITALS — BP 92/70 | HR 153 | Temp 98.5°F | Wt 121.4 lb

## 2016-06-15 DIAGNOSIS — O99512 Diseases of the respiratory system complicating pregnancy, second trimester: Secondary | ICD-10-CM | POA: Diagnosis present

## 2016-06-15 DIAGNOSIS — O99332 Smoking (tobacco) complicating pregnancy, second trimester: Secondary | ICD-10-CM | POA: Insufficient documentation

## 2016-06-15 DIAGNOSIS — Z3480 Encounter for supervision of other normal pregnancy, unspecified trimester: Secondary | ICD-10-CM

## 2016-06-15 DIAGNOSIS — Z3482 Encounter for supervision of other normal pregnancy, second trimester: Secondary | ICD-10-CM

## 2016-06-15 DIAGNOSIS — J069 Acute upper respiratory infection, unspecified: Secondary | ICD-10-CM | POA: Insufficient documentation

## 2016-06-15 DIAGNOSIS — Z3A18 18 weeks gestation of pregnancy: Secondary | ICD-10-CM | POA: Diagnosis not present

## 2016-06-15 DIAGNOSIS — F17210 Nicotine dependence, cigarettes, uncomplicated: Secondary | ICD-10-CM | POA: Diagnosis not present

## 2016-06-15 DIAGNOSIS — R000 Tachycardia, unspecified: Secondary | ICD-10-CM

## 2016-06-15 LAB — COMPREHENSIVE METABOLIC PANEL
ALBUMIN: 3.3 g/dL — AB (ref 3.5–5.0)
ALT: 24 U/L (ref 14–54)
ANION GAP: 7 (ref 5–15)
AST: 34 U/L (ref 15–41)
Alkaline Phosphatase: 97 U/L (ref 38–126)
BILIRUBIN TOTAL: 0.4 mg/dL (ref 0.3–1.2)
BUN: <5 mg/dL — ABNORMAL LOW (ref 6–20)
CO2: 23 mmol/L (ref 22–32)
Calcium: 9 mg/dL (ref 8.9–10.3)
Chloride: 104 mmol/L (ref 101–111)
Creatinine, Ser: 0.46 mg/dL (ref 0.44–1.00)
Glucose, Bld: 88 mg/dL (ref 65–99)
POTASSIUM: 4.2 mmol/L (ref 3.5–5.1)
Sodium: 134 mmol/L — ABNORMAL LOW (ref 135–145)
TOTAL PROTEIN: 7 g/dL (ref 6.5–8.1)

## 2016-06-15 LAB — URINALYSIS, ROUTINE W REFLEX MICROSCOPIC
Bilirubin Urine: NEGATIVE
GLUCOSE, UA: NEGATIVE mg/dL
HGB URINE DIPSTICK: NEGATIVE
Ketones, ur: NEGATIVE mg/dL
Leukocytes, UA: NEGATIVE
Nitrite: NEGATIVE
PROTEIN: NEGATIVE mg/dL
Specific Gravity, Urine: 1.01 (ref 1.005–1.030)
pH: 7.5 (ref 5.0–8.0)

## 2016-06-15 LAB — CBC
HEMATOCRIT: 28 % — AB (ref 36.0–46.0)
Hemoglobin: 9.4 g/dL — ABNORMAL LOW (ref 12.0–15.0)
MCH: 27.6 pg (ref 26.0–34.0)
MCHC: 33.6 g/dL (ref 30.0–36.0)
MCV: 82.1 fL (ref 78.0–100.0)
Platelets: 218 10*3/uL (ref 150–400)
RBC: 3.41 MIL/uL — ABNORMAL LOW (ref 3.87–5.11)
RDW: 14.3 % (ref 11.5–15.5)
WBC: 9.8 10*3/uL (ref 4.0–10.5)

## 2016-06-15 MED ORDER — BENZONATATE 100 MG PO CAPS: 100.0000 mg | ORAL_CAPSULE | Freq: Three times a day (TID) | ORAL | 0 refills | Status: DC | PRN

## 2016-06-15 MED ORDER — GUAIFENESIN ER 600 MG PO TB12: 600.0000 mg | ORAL_TABLET | Freq: Two times a day (BID) | ORAL | 0 refills | Status: DC

## 2016-06-15 MED ORDER — GUAIFENESIN ER 600 MG PO TB12
600.0000 mg | ORAL_TABLET | Freq: Once | ORAL | Status: AC
  Administered 2016-06-15: 600 mg via ORAL
  Filled 2016-06-15: qty 1

## 2016-06-15 NOTE — Progress Notes (Signed)
   PRENATAL VISIT NOTE  Subjective:  Eileen [name] is a 25 y.o. [phone] at [redacted]w[redacted]d being seen today for ongoing prenatal care.  She is currently monitored for the following issues for this low-risk pregnancy and has Goiter, unspecified; Anxiety; Depression with anxiety; Rheumatoid arthritis ([place]); Tobacco abuse; and Supervision of normal pregnancy, antepartum on her problem list.  Patient reports URI for a week. She denies any fevers/chills. She reports a runny nose and nasal congestion and last night the development of a non-productive cough. Her children are sick as well with the same symptoms. Patient reports some burning with urination occasionally.  Contractions: Not present. Vag. Bleeding: None.  Movement: Present. Denies leaking of fluid.   The following portions of the patient's history were reviewed and updated as appropriate: allergies, current medications, past family history, past medical history, past social history, past surgical history and problem list. Problem list updated.  Objective:   Vitals:   06/15/16 0948  BP: 92/70  Pulse: (!) 153  Temp: 98.5 F (36.9 C)  Weight: 121 lb 6.4 oz (55.1 kg)    Fetal Status: Fetal Heart Rate (bpm): 141   Movement: Present     General:  Alert, oriented and cooperative. Patient is in no acute distress.  Skin: Skin is warm and dry. No rash noted.   Cardiovascular: Normal heart rate noted  Respiratory: Normal respiratory effort, no problems with respiration noted, dry cough  Abdomen: Soft, gravid, appropriate for gestational age. Pain/Pressure: Absent     Pelvic:  Cervical exam deferred        Extremities: Normal range of motion.  Edema: None  Mental Status: Normal mood and affect. Normal behavior. Normal judgment and thought content.   Assessment and Plan:  Pregnancy: [id number] at [redacted]w[redacted]d  1. Supervision of other normal pregnancy, antepartum Supportive care instructions provided Reviewed OTC medications she can use to treat her  symptoms Advised patient to stay well hydrated - urine culture collected secondary to symptoms of dysuria Anatomy ultrasound scheduled for tomorrow Follow up for ROB as scheduled - Culture, OB Urine  General obstetric precautions including but not limited to vaginal bleeding, contractions, leaking of fluid and fetal movement were reviewed in detail with the patient. Please refer to After Visit Summary for other counseling recommendations.  No Follow-up on file.   Mora Bellman, MD

## 2016-06-15 NOTE — Discharge Instructions (Signed)
Upper Respiratory Infection, Adult Most upper respiratory infections (URIs) are caused by a virus. A URI affects the nose, throat, and upper air passages. The most common type of URI is often called "the common cold." Follow these instructions at home:  Take medicines only as told by your doctor.  Gargle warm saltwater or take cough drops to comfort your throat as told by your doctor.  Use a warm mist humidifier or inhale steam from a shower to increase air moisture. This may make it easier to breathe.  Drink enough fluid to keep your pee (urine) clear or pale yellow.  Eat soups and other clear broths.  Have a healthy diet.  Rest as needed.  Go back to work when your fever is gone or your doctor says it is okay.  You may need to stay home longer to avoid giving your URI to others.  You can also wear a face mask and wash your hands often to prevent spread of the virus.  Use your inhaler more if you have asthma.  Do not use any tobacco products, including cigarettes, chewing tobacco, or electronic cigarettes. If you need help quitting, ask your doctor. Contact a doctor if:  You are getting worse, not better.  Your symptoms are not helped by medicine.  You have chills.  You are getting more short of breath.  You have brown or red mucus.  You have yellow or brown discharge from your nose.  You have pain in your face, especially when you bend forward.  You have a fever.  You have puffy (swollen) neck glands.  You have pain while swallowing.  You have white areas in the back of your throat. Get help right away if:  You have very bad or constant:  Headache.  Ear pain.  Pain in your forehead, behind your eyes, and over your cheekbones (sinus pain).  Chest pain.  You have long-lasting (chronic) lung disease and any of the following:  Wheezing.  Long-lasting cough.  Coughing up blood.  A change in your usual mucus.  You have a stiff neck.  You have  changes in your:  Vision.  Hearing.  Thinking.  Mood. This information is not intended to replace advice given to you by your health care provider. Make sure you discuss any questions you have with your health care provider. Document Released: 12/30/2007 Document Revised: 03/15/2016 Document Reviewed: 10/18/2013 Elsevier Interactive Patient Education  2017 Elsevier Inc.  

## 2016-06-15 NOTE — MAU Provider Note (Signed)
Chief Complaint:  Tachycardia and URI   First Provider Initiated Contact with Patient 06/15/16 1444     HPI: Eileen [name] is a 25 y.o. [id number] at 39w5dwho presents to maternity admissions reporting upper respiratory infection with new chest pain when coughing and fast heart rate.  States has MVP and was worried because it was so fast at office. Has not had fever.  She denies LOF, vaginal bleeding, vaginal itching/burning, urinary symptoms, h/a, dizziness, n/v, diarrhea, constipation or fever/chills.     URI   This is a new problem. The current episode started in the past 7 days. The problem has been gradually worsening. There has been no fever. Associated symptoms include chest pain, congestion, coughing and rhinorrhea. Pertinent negatives include no abdominal pain, diarrhea, dysuria, headaches, nausea, vomiting or wheezing. She has tried nothing for the symptoms.   RN Note: Was at the doctor earlier today, was told she had a virus.  Coughing, runny nose; no fever.  Cough started yesterday.  Pulse at dr's office was over 150.  When chest starts hurting, the pain goes up into neck and into jaw.   Pain started early this morning, thought she was having a heart attack.  Scaring her.  Will come as a sharp pain, feels like something is crushing in her chest.  Currently not hurting like it was. Hx of MVP  Past Medical History: Past Medical History:  Diagnosis Date  . Abortion 08/04/2011  . Allergy   . Anemia   . Anxiety   . Chronic kidney disease    kidney infection.  . Dysuria   . Headache(784.0)   . Hypothyroid    history of.  . Mitral valve prolapse   . RA (rheumatoid arthritis) ([facility])     Past obstetric history: OB History  Gravida Para Term Preterm AB Living  6 3 3  0 2 3  SAB TAB Ectopic Multiple Live Births    2 0 0 3    # Outcome Date GA Lbr Len/2nd Weight Sex Delivery Anes PTL Lv  6 Current           5 Term 06/16/13 [redacted]w[redacted]d 19:30 / 00:31 6 lb 1.9 oz (2.775 kg) F Vag-Spont EPI  LIV   4 TAB 2013          3 Term 01/01/10 [redacted]w[redacted]d  7 lb 3 oz (3.26 kg) F Vag-Spont  N LIV  2 Term 08/2008   6 lb 11 oz (3.033 kg) F Vag-Spont  N LIV  1 TAB 2008              Past Surgical History: Past Surgical History:  Procedure Laterality Date  . LAPAROSCOPY N/A 05/25/2014   Procedure: LAPAROSCOPY DIAGNOSTIC;  Surgeon: Osborne Oman, MD;  Location: [facility];  Service: Gynecology;  Laterality: N/A;  . THERAPEUTIC ABORTION N/A 2012    Family History: Family History  Problem Relation Age of Onset  . Cancer Mother     SKIN  . Hyperlipidemia Mother   . Thyroid disease Mother   . Heart disease Mother   . Asthma Brother   . Diabetes Maternal Grandmother   . Hyperlipidemia Maternal Grandmother   . Emphysema Paternal Grandfather     Social History: Social History  Substance Use Topics  . Smoking status: Current Every Day Smoker    Packs/day: 0.50    Types: Cigarettes  . Smokeless tobacco: Never Used  . Alcohol use 0.0 oz/week     Comment: occasional but not while pregnant  Allergies:  Allergies  Allergen Reactions  . Eggs Or Egg-Derived Products Nausea Only  . Other Hives and Nausea And Vomiting    Green beans  . Red Dye Nausea And Vomiting  . Sulfa Antibiotics Hives and Nausea And Vomiting    Childhood reaction    Meds:  Prescriptions Prior to Admission  Medication Sig Dispense Refill Last Dose  . acetaminophen (TYLENOL) 500 MG tablet Take 500 mg by mouth daily as needed for mild pain or headache.    Not Taking  . DULoxetine (CYMBALTA) 30 MG capsule Take 1 capsule (30 mg total) by mouth daily. (Patient not taking: Reported on 06/09/2016) 30 capsule 3 Not Taking  . ondansetron (ZOFRAN ODT) 4 MG disintegrating tablet Take 1 tablet (4 mg total) by mouth every 6 (six) hours as needed for nausea. 20 tablet 0 Taking  . Pediatric Multiple Vit-C-FA (FLINSTONES GUMMIES OMEGA-3 DHA) CHEW Chew 2 each by mouth every morning.   Taking  . Prenatal MV-Min-FA-Omega-3 (PRENATAL  GUMMIES/DHA & FA) 0.4-32.5 MG CHEW Chew by mouth.   Taking  . promethazine (PHENERGAN) 25 MG tablet Take 1 tablet (25 mg total) by mouth every 6 (six) hours as needed. (Patient not taking: Reported on 06/09/2016) 30 tablet 1 Not Taking    I have reviewed patient's Past Medical Hx, Surgical Hx, Family Hx, Social Hx, medications and allergies.   ROS:  Review of Systems  HENT: Positive for congestion and rhinorrhea.   Respiratory: Positive for cough. Negative for wheezing.   Cardiovascular: Positive for chest pain.  Gastrointestinal: Negative for abdominal pain, diarrhea, nausea and vomiting.  Genitourinary: Negative for dysuria.  Neurological: Negative for headaches.   Other systems negative  Physical Exam  Patient Vitals for the past 24 hrs:  BP Temp Temp src Pulse Resp SpO2 Weight  06/15/16 1428 104/59 98.4 F (36.9 C) Oral 112 18 100 % 121 lb 6.4 oz (55.1 kg)  06/15/16 1426 - - - - - 100 % -   Constitutional: Well-developed, well-nourished female in no acute distress.  Cardiovascular: normal rhythm, slightly tachycardic 110-120s Respiratory: normal effort, clear to auscultation bilaterally, no wheezing GI: Abd soft, non-tender, gravid appropriate for gestational age.   No rebound or guarding. MS: Extremities nontender, no edema, normal ROM Neurologic: Alert and oriented x 4.  GU: Neg CVAT.  PELVIC EXAM: deferred  FHR 129   Labs: Results for orders placed or performed during the hospital encounter of 06/15/16 (from the past 24 hour(s))  Urinalysis, Routine w reflex microscopic (not at [facility])     Status: None   Collection Time: 06/15/16  2:43 PM  Result Value Ref Range   Color, Urine YELLOW YELLOW   APPearance CLEAR CLEAR   Specific Gravity, Urine 1.010 1.005 - 1.030   pH 7.5 5.0 - 8.0   Glucose, UA NEGATIVE NEGATIVE mg/dL   Hgb urine dipstick NEGATIVE NEGATIVE   Bilirubin Urine NEGATIVE NEGATIVE   Ketones, ur NEGATIVE NEGATIVE mg/dL   Protein, ur NEGATIVE NEGATIVE  mg/dL   Nitrite NEGATIVE NEGATIVE   Leukocytes, UA NEGATIVE NEGATIVE  CBC     Status: Abnormal   Collection Time: 06/15/16  3:49 PM  Result Value Ref Range   WBC 9.8 4.0 - 10.5 K/uL   RBC 3.41 (L) 3.87 - 5.11 MIL/uL   Hemoglobin 9.4 (L) 12.0 - 15.0 g/dL   HCT 28.0 (L) 36.0 - 46.0 %   MCV 82.1 78.0 - 100.0 fL   MCH 27.6 26.0 - 34.0 pg   MCHC  33.6 30.0 - 36.0 g/dL   RDW 14.3 11.5 - 15.5 %   Platelets 218 150 - 400 K/uL  Comprehensive metabolic panel     Status: Abnormal   Collection Time: 06/15/16  3:49 PM  Result Value Ref Range   Sodium 134 (L) 135 - 145 mmol/L   Potassium 4.2 3.5 - 5.1 mmol/L   Chloride 104 101 - 111 mmol/L   CO2 23 22 - 32 mmol/L   Glucose, Bld 88 65 - 99 mg/dL   BUN <5 (L) 6 - 20 mg/dL   Creatinine, Ser 0.46 0.44 - 1.00 mg/dL   Calcium 9.0 8.9 - 10.3 mg/dL   Total Protein 7.0 6.5 - 8.1 g/dL   Albumin 3.3 (L) 3.5 - 5.0 g/dL   AST 34 15 - 41 U/L   ALT 24 14 - 54 U/L   Alkaline Phosphatase 97 38 - 126 U/L   Total Bilirubin 0.4 0.3 - 1.2 mg/dL   GFR calc non Af Amer >60 >60 mL/min   GFR calc Af Amer >60 >60 mL/min   Anion gap 7 5 - 15   12 lead EKG :   Sinus Tachycardia with rate of 107, no ectopy  Imaging:  Dg Chest 2 View  Result Date: 06/15/2016 CLINICAL DATA:  Upper respiratory tract infection. EXAM: CHEST  2 VIEW COMPARISON:  04/27/2015 FINDINGS: The heart size and mediastinal contours are within normal limits. Both lungs are clear. The visualized skeletal structures are unremarkable. IMPRESSION: No active cardiopulmonary disease. Electronically Signed   By: Kerby Moors M.D.   On: 06/15/2016 16:18    MAU Course/MDM: I have ordered labs and reviewed results after consultation with Dr Nehemiah Settle.  He recommends ekg, CBC, CMET, and UA.  Urine is dilute, no sign of dehydration  Consult Dr Nehemiah Settle with presentation, exam findings and test results.  Treatments in MAU included Mucinex, CXR.    CXR was clear. Labs normal.  Suspect this is primarily URI  with bronchitis, likely viral.  Tachycardia could be from low grade fever or underlying problem Will refer to Cardiology as outpatient Rx Tessalon perles for cough Rx Mucinex for cough  Assessment: [facility] at [redacted]w[redacted]d Upper Respiratory Infection  Plan: Discharge home Rx Mucinex for cough Rx Tessalon perles for cough Supportive care Follow up in Office for prenatal visits and recheck of status  Encouraged to return here or to other Urgent Care/ED if she develops worsening of symptoms, increase in pain, fever, or other concerning symptoms.   Pt stable at time of discharge.  Hansel Feinstein CNM, MSN Certified Nurse-Midwife 06/15/2016 2:44 PM

## 2016-06-15 NOTE — MAU Note (Signed)
Was at the doctor earlier today, was told she had a virus.  Coughing, runny nose; no fever.  Cough started yesterday.  Pulse at dr's office was over 150.  When chest starts hurting, the pain goes up into neck and into jaw.   Pain started early this morning, thought she was having a heart attack.  Scaring her.  Will come as a sharp pain, feels like something is crushing in her chest.  Currently not hurting like it was. Hx of MVP

## 2016-06-15 NOTE — Progress Notes (Signed)
Patient stated a week ago cold symptoms started. Pt reports runny nose, cough, sore throat and chest pain making it hard to breath. Patient reports good fetal movement. Pulse ox 99%

## 2016-06-16 ENCOUNTER — Encounter (HOSPITAL_COMMUNITY): Payer: Self-pay

## 2016-06-16 ENCOUNTER — Other Ambulatory Visit: Payer: Self-pay | Admitting: Certified Nurse Midwife

## 2016-06-16 ENCOUNTER — Ambulatory Visit (HOSPITAL_COMMUNITY)
Admission: RE | Admit: 2016-06-16 | Discharge: 2016-06-16 | Disposition: A | Discharge status: Home / Self Care | Payer: Medicaid Other | Source: Ambulatory Visit | Attending: Obstetrics & Gynecology | Admitting: Obstetrics & Gynecology

## 2016-06-16 ENCOUNTER — Other Ambulatory Visit: Payer: Self-pay | Admitting: Obstetrics & Gynecology

## 2016-06-16 DIAGNOSIS — M069 Rheumatoid arthritis, unspecified: Secondary | ICD-10-CM | POA: Insufficient documentation

## 2016-06-16 DIAGNOSIS — E063 Autoimmune thyroiditis: Secondary | ICD-10-CM | POA: Diagnosis not present

## 2016-06-16 DIAGNOSIS — O99342 Other mental disorders complicating pregnancy, second trimester: Secondary | ICD-10-CM

## 2016-06-16 DIAGNOSIS — R000 Tachycardia, unspecified: Secondary | ICD-10-CM | POA: Insufficient documentation

## 2016-06-16 DIAGNOSIS — Z3A18 18 weeks gestation of pregnancy: Secondary | ICD-10-CM

## 2016-06-16 DIAGNOSIS — O99332 Smoking (tobacco) complicating pregnancy, second trimester: Secondary | ICD-10-CM | POA: Diagnosis not present

## 2016-06-16 DIAGNOSIS — O26892 Other specified pregnancy related conditions, second trimester: Secondary | ICD-10-CM | POA: Diagnosis not present

## 2016-06-16 DIAGNOSIS — M797 Fibromyalgia: Secondary | ICD-10-CM | POA: Insufficient documentation

## 2016-06-16 DIAGNOSIS — O99282 Endocrine, nutritional and metabolic diseases complicating pregnancy, second trimester: Secondary | ICD-10-CM | POA: Diagnosis not present

## 2016-06-16 DIAGNOSIS — O2690 Pregnancy related conditions, unspecified, unspecified trimester: Secondary | ICD-10-CM

## 2016-06-16 DIAGNOSIS — Z363 Encounter for antenatal screening for malformations: Secondary | ICD-10-CM

## 2016-06-16 DIAGNOSIS — Z3480 Encounter for supervision of other normal pregnancy, unspecified trimester: Secondary | ICD-10-CM

## 2016-06-16 DIAGNOSIS — F418 Other specified anxiety disorders: Secondary | ICD-10-CM | POA: Diagnosis not present

## 2016-06-16 HISTORY — DX: Major depressive disorder, single episode, unspecified: F32.9

## 2016-06-16 HISTORY — DX: Fibromyalgia: M79.7

## 2016-06-16 HISTORY — DX: Depression, unspecified: F32.A

## 2016-06-16 NOTE — Progress Notes (Signed)
[facility]  Patient Name: Eileen [name] Medical Record Number:  [id number] Date of Birth: 07-09-1991 Requesting Physician Name:  Woodroe Mode, MD Date of Service: 06/16/2016  Chief Complaint Multiple medical issues in pregnancy  History of Present Illness Eileen [name] was seen today secondary to multiple medical issues in pregnancy at the request of Woodroe Mode, MD.  The patient is a 25 y.o. [id number] [redacted]w[redacted]d with an EDD of 11/11/2016, by Last Menstrual Period dating method.  Eileen [name] has a history of depression, anxiety, fibromyalgia, rheumatoid arthritis, and hashimoto's thyroiditis.  She was taking Cymbalta, Latuda, and Xanax for her depression and anxiety, as well as Lyrica for her fibromyalgia.  She stopped taking all of these medications when she learned she was pregnant.  Since stopping she has noticed a worsening of her depression and anxiety as well as increased in the diffuse pain she has that is due to her fibromyalgia.  She has no suicidal or homicidal ideation at this time.  She has no vaginal bleeding, loss of fluid, or contractions.  Review of Systems Pertinent items are noted in HPI.  Patient History OB History  Gravida Para Term Preterm AB Living  6 3 3  0 2 3  SAB TAB Ectopic Multiple Live Births    2 0 0 3    # Outcome Date GA Lbr Len/2nd Weight Sex Delivery Anes PTL Lv  6 Current           5 Term 06/16/13 [redacted]w[redacted]d 19:30 / 00:31 6 lb 1.9 oz (2.775 kg) F Vag-Spont EPI  LIV  4 TAB 2013          3 Term 01/01/10 [redacted]w[redacted]d  7 lb 3 oz (3.26 kg) F Vag-Spont  N LIV  2 Term 08/2008   6 lb 11 oz (3.033 kg) F Vag-Spont  N LIV  1 TAB 2008              Past Medical History:  Diagnosis Date  . Abortion 08/04/2011  . Allergy   . Anemia   . Anxiety   . Chronic kidney disease    kidney infection.  . Depression   . Dysuria   . Fibromyalgia   . Headache(784.0)   . Hypothyroid    history of.  . Mitral valve prolapse   . RA (rheumatoid arthritis) ([place])      Past Surgical History:  Procedure Laterality Date  . LAPAROSCOPY N/A 05/25/2014   Procedure: LAPAROSCOPY DIAGNOSTIC;  Surgeon: Osborne Oman, MD;  Location: [facility];  Service: Gynecology;  Laterality: N/A;  . THERAPEUTIC ABORTION N/A 2012    Social History   Social History  . Marital status: Single    Spouse name: N/A  . Number of children: N/A  . Years of education: N/A   Social History Main Topics  . Smoking status: Current Every Day Smoker    Packs/day: 0.50    Types: Cigarettes  . Smokeless tobacco: Never Used  . Alcohol use 0.0 oz/week     Comment: occasional but not while pregnant  . Drug use: No  . Sexual activity: Yes    Partners: Male    Birth control/ protection: None     Comment: Mirena   Other Topics Concern  . Not on file   Social History Narrative  . No narrative on file    Family History  Problem Relation Age of Onset  . Cancer Mother     SKIN  . Hyperlipidemia Mother   .  Thyroid disease Mother   . Heart disease Mother   . Asthma Brother   . Diabetes Maternal Grandmother   . Hyperlipidemia Maternal Grandmother   . Emphysema Paternal Grandfather    In addition, the patient has no family history of mental retardation, birth defects, or genetic diseases.  Physical Examination There were no vitals filed for this visit. General appearance - alert, well appearing, and in no distress Mental status - alert, oriented to person, place, and time  Assessment and Recommendations 1.  Depression and Anxiety.  Although Eileen [name]'s symptoms have worsened, she reports she is managing relatively well and is able to manage her daily affairs.  She is very concerned about the risks of restarting her prior medications to her fetus.  We discussed that Xanax is typically avoided in pregnancy and that since Cymbalta and Latuda are relatively new medications their safety in pregnancy is not known.  However, there are numerous SSRI's that have a good safety profile  in pregnancy save for the risk of a neonatal abstinence syndrome.  This syndrome does not occur in all fetuses exposed to [facility], is self-limited, and does not appear to cause any long-term neurobehavioral effects.  Thus, while she does not wish to be treated for depression at this time, if her symptoms worsen and she has difficult with her daily affairs, starting citalopram would be reasonable. 2.  Fibromyalgia and Rheumatoid Arthritis.  Eileen [name] cannot discern which of her numerous pain complaints are due to fibromyalgia or rheumatoid arthritis.  As mentioned in the HPI she has experienced a worsening of pain since she stopped taking her medications.  However, as with her depression, these problems are under relatively good control and she is able to manage her daily activities without significant difficulty.  She does not wish to restart medications for these issues at this time.  I do not see any need to do so. 3.  Hashimoto's Thyroiditis.  Eileen [name] reports that she goes back and forth between being hypothyroid and euthyroid.  When her thyroid function tests were most recently drawn she reports she was euthyroid.  I recommend repeat a set of thyroid function tests as thyroid hormone requirements increase in pregnancy which can unmask subclinical hypothyroidism.  Hypothyroidism is not associated with significant pregnancy complications so long as thyroid hormone replacement is adequate.  Hormone requirement typically increase significantly during pregnancy.  If she is hypothyroid Synthroid should be started.  TSH should also be rechecked four weeks after every dose change or a minimum of once per trimester.  Thyroid replacement should be titrated to keep the TSH value in the lower half of the normal range.   I spent 30 minutes with Eileen [name] today of which 50% was face-to-face counseling.  Thank you for referring Eileen [name] to the [facility].  Please do not hesitate to contact us with questions.

## 2016-06-18 LAB — URINE CULTURE, OB REFLEX

## 2016-06-18 LAB — CULTURE, OB URINE

## 2016-06-22 ENCOUNTER — Other Ambulatory Visit: Payer: Self-pay | Admitting: Obstetrics and Gynecology

## 2016-06-22 MED ORDER — CEPHALEXIN 500 MG PO CAPS: 500.0000 mg | ORAL_CAPSULE | Freq: Three times a day (TID) | ORAL | 2 refills | Status: DC

## 2016-06-30 ENCOUNTER — Telehealth: Payer: Self-pay

## 2016-06-30 NOTE — Telephone Encounter (Signed)
Contacted pt and informed about lab results and rx sent.

## 2016-07-07 ENCOUNTER — Ambulatory Visit (INDEPENDENT_AMBULATORY_CARE_PROVIDER_SITE_OTHER): Payer: Medicaid Other | Admitting: Obstetrics and Gynecology

## 2016-07-07 VITALS — BP 115/76 | HR 117 | Wt 124.0 lb

## 2016-07-07 DIAGNOSIS — Z3482 Encounter for supervision of other normal pregnancy, second trimester: Secondary | ICD-10-CM

## 2016-07-07 DIAGNOSIS — R000 Tachycardia, unspecified: Secondary | ICD-10-CM

## 2016-07-07 DIAGNOSIS — Z3480 Encounter for supervision of other normal pregnancy, unspecified trimester: Secondary | ICD-10-CM

## 2016-07-07 NOTE — Progress Notes (Signed)
Subjective:  Eileen [name] is a 25 y.o. [id number] at [redacted]w[redacted]d being seen today for ongoing prenatal care.  She is currently monitored for the following issues for this low-risk pregnancy and has Goiter, unspecified; Anxiety; Depression with anxiety; Rheumatoid arthritis ([place]); Tobacco abuse; Supervision of normal pregnancy, antepartum; and Tachycardia on her problem list.  Patient reports tachycardia.  Contractions: Not present. Vag. Bleeding: None.  Movement: Present. Denies leaking of fluid.   The following portions of the patient's history were reviewed and updated as appropriate: allergies, current medications, past family history, past medical history, past social history, past surgical history and problem list. Problem list updated.  Objective:   Vitals:   07/07/16 1013  BP: 115/76  Pulse: (!) 117  Weight: 124 lb (56.2 kg)    Fetal Status: Fetal Heart Rate (bpm): 147   Movement: Present     General:  Alert, oriented and cooperative. Patient is in no acute distress.  Skin: Skin is warm and dry. No rash noted.   Cardiovascular: Normal heart rate noted  Respiratory: Normal respiratory effort, no problems with respiration noted  Abdomen: Soft, gravid, appropriate for gestational age. Pain/Pressure: Absent     Pelvic:  Cervical exam deferred        Extremities: Normal range of motion.  Edema: None  Mental Status: Normal mood and affect. Normal behavior. Normal judgment and thought content.   Urinalysis:      Assessment and Plan:  Pregnancy: [id number] at [redacted]w[redacted]d  1. Supervision of other normal pregnancy, antepartum   2. Tachycardia Will refer to cardilogy for further evaluation Pt reports H/O MVP and had had problems with tachycardia in prior pregnancies but no medications She also reports a H/O Hosimoto thyroiditis  - TSH  Preterm labor symptoms and general obstetric precautions including but not limited to vaginal bleeding, contractions, leaking of fluid and fetal movement were  reviewed in detail with the patient. Please refer to After Visit Summary for other counseling recommendations.  No Follow-up on file.   Chancy Milroy, MD

## 2016-07-07 NOTE — Progress Notes (Signed)
Patient reports she is doing well 

## 2016-07-08 ENCOUNTER — Encounter: Payer: Self-pay | Admitting: Cardiovascular Disease

## 2016-07-08 ENCOUNTER — Ambulatory Visit (INDEPENDENT_AMBULATORY_CARE_PROVIDER_SITE_OTHER): Payer: Medicaid Other | Admitting: Cardiovascular Disease

## 2016-07-08 VITALS — BP 118/66 | HR 124 | Ht <= 58 in | Wt 126.2 lb

## 2016-07-08 DIAGNOSIS — R002 Palpitations: Secondary | ICD-10-CM | POA: Diagnosis not present

## 2016-07-08 DIAGNOSIS — I341 Nonrheumatic mitral (valve) prolapse: Secondary | ICD-10-CM

## 2016-07-08 DIAGNOSIS — R000 Tachycardia, unspecified: Secondary | ICD-10-CM | POA: Diagnosis not present

## 2016-07-08 LAB — CBC WITH DIFFERENTIAL/PLATELET
BASOS ABS: 0 {cells}/uL (ref 0–200)
Basophils Relative: 0 %
EOS PCT: 1 %
Eosinophils Absolute: 119 cells/uL (ref 15–500)
HCT: 27.2 % — ABNORMAL LOW (ref 35.0–45.0)
HEMOGLOBIN: 8.7 g/dL — AB (ref 11.7–15.5)
LYMPHS ABS: 2023 {cells}/uL (ref 850–3900)
LYMPHS PCT: 17 %
MCH: 25.5 pg — AB (ref 27.0–33.0)
MCHC: 32 g/dL (ref 32.0–36.0)
MCV: 79.8 fL — ABNORMAL LOW (ref 80.0–100.0)
MPV: 10.8 fL (ref 7.5–12.5)
Monocytes Absolute: 714 cells/uL (ref 200–950)
Monocytes Relative: 6 %
NEUTROS PCT: 76 %
Neutro Abs: 9044 cells/uL — ABNORMAL HIGH (ref 1500–7800)
Platelets: 249 10*3/uL (ref 140–400)
RBC: 3.41 MIL/uL — ABNORMAL LOW (ref 3.80–5.10)
RDW: 15.1 % — AB (ref 11.0–15.0)
WBC: 11.9 10*3/uL — ABNORMAL HIGH (ref 3.8–10.8)

## 2016-07-08 LAB — TSH: TSH: 3.8 u[IU]/mL (ref 0.450–4.500)

## 2016-07-08 MED ORDER — METOPROLOL TARTRATE 25 MG PO TABS: ORAL_TABLET | ORAL | 5 refills | Status: DC

## 2016-07-08 NOTE — Progress Notes (Signed)
Cardiology Office Note   Date:  07/08/2016   ID:  Eileen [name], DOB 1990-08-24, MRN [id number]  PCP:  No PCP Per Patient  Cardiologist:   Skeet Latch, MD   Chief Complaint  Patient presents with  . Palpitations    History of Present Illness: Eileen [name] is a 25 y.o. pregnant female with anxiety, rheumatoid arthritis, Hashimoto's thyroiditis, and tobacco abuse who presents for an evaluation of tachycardia. She was seen by her OB/GYN on 07/07/16 and noted to be tachycardic with a heart rate of 117 bpm. She reported a history of mitral valve prolapse and was referred to cardiology for further evaluation.  TSH was within normal limits.  Potassium was within normal limits on 06/15/16 and her hemoglobin was 9.4 at that time.  She reports being diagnosed with mitral valve prolapse six years ago with her first pregnancy.  She has palpitations at baseline, but it is always worse with pregnancy.  Her heart rate is around 90 when not pregnant.  Since becoming pregnant her heart rate has been around 120-140.  It has been as high as 170 and she get lightheaded when it is this high.  She denies syncope.  She drinks sweet tea but rarely has coffee.  She doesn't use any over the counter cold/cough medication or illicits.  She does smoke 1/2 ppd.  [name] has been pregnant four times.  Currently her heart races most of the day.  She notes shortness of breath both at rest and with exercise.  She denies lower extremity edema, orthopnea or PND.  She has not been exercising regularly.  She notes chest pain both now and when she isn't pregnant.  It occurs most days and is typically associated her anxiety.  Since being pregnant she hasn't been taking Cymbalta or Xanax and her anxiety has been poorly-controlled.    Past Medical History:  Diagnosis Date  . Abortion 08/04/2011  . Allergy   . Anemia   . Anxiety   . Chronic kidney disease    kidney infection.  . Depression   . Dysuria   . Fibromyalgia    . Fibromyalgia   . Headache(784.0)   . Hypothyroid    history of.  . Mitral valve prolapse   . RA (rheumatoid arthritis) ([place])     Past Surgical History:  Procedure Laterality Date  . LAPAROSCOPY N/A 05/25/2014   Procedure: LAPAROSCOPY DIAGNOSTIC;  Surgeon: Osborne Oman, MD;  Location: [facility];  Service: Gynecology;  Laterality: N/A;  . THERAPEUTIC ABORTION N/A 2012     Current Outpatient Prescriptions  Medication Sig Dispense Refill  . acetaminophen (TYLENOL) 500 MG tablet Take 500 mg by mouth daily as needed for mild pain or headache.     . Prenatal MV-Min-FA-Omega-3 (PRENATAL GUMMIES/DHA & FA) 0.4-32.5 MG CHEW Chew by mouth.    . metoprolol tartrate (LOPRESSOR) 25 MG tablet 1/2 TABLET BY MOUTH TWICE A DAY 30 tablet 5   No current facility-administered medications for this visit.     Allergies:   Eggs or egg-derived products; Other; Red dye; and Sulfa antibiotics    Social History:  The patient  reports that she has been smoking Cigarettes.  She has been smoking about 0.50 packs per day. She has never used smokeless tobacco. She reports that she drinks alcohol. She reports that she does not use drugs.   Family History:  The patient's family history includes Asthma in her brother; Cancer in her mother; Diabetes in her maternal grandmother;  Emphysema in her paternal grandfather; Heart attack in her mother; Heart disease in her mother; Hyperlipidemia in her maternal grandmother and mother; Mitral valve prolapse in her mother; Thyroid disease in her mother.    ROS:  Please see the history of present illness.   Otherwise, review of systems are positive for none.   All other systems are reviewed and negative.    PHYSICAL EXAM: VS:  BP 118/66   Pulse (!) 124   Ht 4\' 10"  (1.473 m)   Wt 57.2 kg (126 lb 3.2 oz)   LMP 02/05/2016 (Exact Date)   BMI 26.38 kg/m  , BMI Body mass index is 26.38 kg/m. GENERAL:  Well appearing HEENT:  Pupils equal round and reactive, fundi not  visualized, oral mucosa unremarkable NECK:  No jugular venous distention, waveform within normal limits, carotid upstroke brisk and symmetric, no bruits, no thyromegaly LYMPHATICS:  No cervical adenopathy LUNGS:  Clear to auscultation bilaterally HEART:  Regular rhythm.  Tachycardic.  PMI not displaced or sustained,S1 and S2 within normal limits, no S3, no S4, no clicks, no rubs, no murmurs ABD:  Flat, positive bowel sounds normal in frequency in pitch, no bruits, no rebound, no guarding, no midline pulsatile mass, no hepatomegaly, no splenomegaly EXT:  2 plus pulses throughout, no edema, no cyanosis no clubbing SKIN:  No rashes no nodules NEURO:  Cranial nerves II through XII grossly intact, motor grossly intact throughout PSYCH:  Cognitively intact, oriented to person place and time    EKG:  EKG is ordered today. The ekg ordered today demonstrates sinus tachycardia rate 124 bpm.     Recent Labs: 06/15/2016: ALT 24; BUN <5; Creatinine, Ser 0.46; Hemoglobin 9.4; Platelets 218; Potassium 4.2; Sodium 134 07/07/2016: TSH 3.800    Lipid Panel    Component Value Date/Time   CHOL 212 (H) 06/28/2014 0954   TRIG 62.0 06/28/2014 0954   HDL 32.50 (L) 06/28/2014 0954   CHOLHDL 7 06/28/2014 0954   VLDL 12.4 06/28/2014 0954   LDLCALC 167 (H) 06/28/2014 0954      Wt Readings from Last 3 Encounters:  07/08/16 57.2 kg (126 lb 3.2 oz)  07/07/16 56.2 kg (124 lb)  06/16/16 54.9 kg (121 lb)      ASSESSMENT AND PLAN:  # Palpitations: # Mitral valve prolapse: [name] has sinus tachycardia at rest.  This is likely multifactorial from poorly-controlled anxiety, pregnancy, deconditioning, and mitral valve prolapse.  She is symptomatic from these palpitations.  We will start metoprolol tartrate 12.5mg  bid.  She had recent thyroid studies.  In November her hgb was down to 9.4 from 11.5 in September.  We will repeat a CBC and check a BMP and magensium.  We will also get an echo to ensure she  doesn't have any evidence of tachycardia induced cardiology.     Current medicines are reviewed at length with the patient today.  The patient does not have concerns regarding medicines.  The following changes have been made:  Metoprolol 12.5mg  bid  Labs/ tests ordered today include:   Orders Placed This Encounter  Procedures  . CBC with Differential/Platelet  . Magnesium  . Basic metabolic panel  . EKG 12-Lead  . ECHOCARDIOGRAM COMPLETE     Disposition:   FU with our APPs in 1 month.  Arion Shankles C. Oval Linsey, MD, [facility] in 3 months    This note was written with the assistance of speech recognition software.  Please excuse any transcriptional errors.  Signed, Joreen Swearingin C. Oval Linsey, MD, [facility]  07/08/2016 5:44 PM    [facility]

## 2016-07-08 NOTE — Patient Instructions (Signed)
Medication Instructions:  START METOPROLOL TART 25 MG 1/2 TABLET BY MOUTH TWICE A DAY  Labwork: CBC/BMET/MAGNESIUM AT [facility] ON THE FIRST FLOOR   Testing/Procedures: Your physician has requested that you have an echocardiogram. Echocardiography is a painless test that uses sound waves to create images of your heart. It provides your doctor with information about the size and shape of your heart and how well your heart's chambers and valves are working. This procedure takes approximately one hour. There are no restrictions for this procedure. [address] [address]  Follow-Up: Your physician recommends that you schedule a follow-up appointment in: [place]  Your physician recommends that you schedule a follow-up appointment in: [address] [facility]   If you need a refill on your cardiac medications before your next appointment, please call your pharmacy.

## 2016-07-09 LAB — MAGNESIUM: MAGNESIUM: 1.7 mg/dL (ref 1.5–2.5)

## 2016-07-09 LAB — BASIC METABOLIC PANEL
BUN: 4 mg/dL — ABNORMAL LOW (ref 7–25)
CALCIUM: 9.2 mg/dL (ref 8.6–10.2)
CO2: 19 mmol/L — AB (ref 20–31)
CREATININE: 0.43 mg/dL — AB (ref 0.50–1.10)
Chloride: 106 mmol/L (ref 98–110)
GLUCOSE: 95 mg/dL (ref 65–99)
Potassium: 4.1 mmol/L (ref 3.5–5.3)
Sodium: 138 mmol/L (ref 135–146)

## 2016-07-27 ENCOUNTER — Inpatient Hospital Stay (HOSPITAL_COMMUNITY)
Admission: AD | Admit: 2016-07-27 | Discharge: 2016-07-27 | Disposition: A | Discharge status: Home / Self Care | Payer: Medicaid Other | Source: Ambulatory Visit | Attending: Obstetrics and Gynecology | Admitting: Obstetrics and Gynecology

## 2016-07-27 ENCOUNTER — Encounter (HOSPITAL_COMMUNITY): Payer: Self-pay | Admitting: *Deleted

## 2016-07-27 DIAGNOSIS — Z3A24 24 weeks gestation of pregnancy: Secondary | ICD-10-CM | POA: Diagnosis not present

## 2016-07-27 DIAGNOSIS — M5432 Sciatica, left side: Secondary | ICD-10-CM

## 2016-07-27 DIAGNOSIS — O99332 Smoking (tobacco) complicating pregnancy, second trimester: Secondary | ICD-10-CM | POA: Diagnosis not present

## 2016-07-27 DIAGNOSIS — F17210 Nicotine dependence, cigarettes, uncomplicated: Secondary | ICD-10-CM | POA: Diagnosis not present

## 2016-07-27 DIAGNOSIS — M5430 Sciatica, unspecified side: Secondary | ICD-10-CM | POA: Insufficient documentation

## 2016-07-27 DIAGNOSIS — O26892 Other specified pregnancy related conditions, second trimester: Secondary | ICD-10-CM | POA: Insufficient documentation

## 2016-07-27 MED ORDER — CYCLOBENZAPRINE HCL 10 MG PO TABS: 10.0000 mg | ORAL_TABLET | Freq: Two times a day (BID) | ORAL | 0 refills | Status: DC | PRN

## 2016-07-27 NOTE — Discharge Instructions (Signed)
Back Pain, Adult Back pain is very common in adults.The cause of back pain is rarely dangerous and the pain often gets better over time.The cause of your back pain may not be known. Some common causes of back pain include:  Strain of the muscles or ligaments supporting the spine.  Wear and tear (degeneration) of the spinal disks.  Arthritis.  Direct injury to the back. For many people, back pain may return. Since back pain is rarely dangerous, most people can learn to manage this condition on their own. Follow these instructions at home: Watch your back pain for any changes. The following actions may help to lessen any discomfort you are feeling:  Remain active. It is stressful on your back to sit or stand in one place for long periods of time. Do not sit, drive, or stand in one place for more than 30 minutes at a time. Take short walks on even surfaces as soon as you are able.Try to increase the length of time you walk each day.  Exercise regularly as directed by your health care provider. Exercise helps your back heal faster. It also helps avoid future injury by keeping your muscles strong and flexible.  Do not stay in bed.Resting more than 1-2 days can delay your recovery.  Pay attention to your body when you bend and lift. The most comfortable positions are those that put less stress on your recovering back. Always use proper lifting techniques, including:  Bending your knees.  Keeping the load close to your body.  Avoiding twisting.  Find a comfortable position to sleep. Use a firm mattress and lie on your side with your knees slightly bent. If you lie on your back, put a pillow under your knees.  Avoid feeling anxious or stressed.Stress increases muscle tension and can worsen back pain.It is important to recognize when you are anxious or stressed and learn ways to manage it, such as with exercise.  Take medicines only as directed by your health care provider.  Over-the-counter medicines to reduce pain and inflammation are often the most helpful.Your health care provider may prescribe muscle relaxant drugs.These medicines help dull your pain so you can more quickly return to your normal activities and healthy exercise.  Apply ice to the injured area:  Put ice in a plastic bag.  Place a towel between your skin and the bag.  Leave the ice on for 20 minutes, 2-3 times a day for the first 2-3 days. After that, ice and heat may be alternated to reduce pain and spasms.  Maintain a healthy weight. Excess weight puts extra stress on your back and makes it difficult to maintain good posture. Contact a health care provider if:  You have pain that is not relieved with rest or medicine.  You have increasing pain going down into the legs or buttocks.  You have pain that does not improve in one week.  You have night pain.  You lose weight.  You have a fever or chills. Get help right away if:  You develop new bowel or bladder control problems.  You have unusual weakness or numbness in your arms or legs.  You develop nausea or vomiting.  You develop abdominal pain.  You feel faint. This information is not intended to replace advice given to you by your health care provider. Make sure you discuss any questions you have with your health care provider. Document Released: 07/13/2005 Document Revised: 11/21/2015 Document Reviewed: 11/14/2013 Elsevier Interactive Patient Education  2017 Elsevier  Inc.   Chronic Back Pain When back pain lasts longer than 3 months, it is called chronic back pain.The cause of your back pain may not be known. Some common causes include:  Wear and tear (degenerative disease) of the bones, ligaments, or disks in your back.  Inflammation and stiffness in your back (arthritis). People who have chronic back pain often go through certain periods in which the pain is more intense (flare-ups). Many people can learn to manage  the pain with home care. Follow these instructions at home: Pay attention to any changes in your symptoms. Take these actions to help with your pain: Activity  Avoid bending and activities that make the problem worse.  Do not sit or stand in one place for long periods of time.  Take brief periods of rest throughout the day. This will reduce your pain. Resting in a lying or standing position is usually better than sitting to rest.  When you are resting for longer periods, mix in some mild activity or stretching between periods of rest. This will help to prevent stiffness and pain.  Get regular exercise. Ask your health care provider what activities are safe for you.  Do not lift anything that is heavier than 10 lb (4.5 kg). Always use proper lifting technique, which includes:  Bending your knees.  Keeping the load close to your body.  Avoiding twisting. Managing pain  If directed, apply ice to the painful area. Your health care provider may recommend applying ice during the first 24-48 hours after a flare-up begins.  Put ice in a plastic bag.  Place a towel between your skin and the bag.  Leave the ice on for 20 minutes, 2-3 times per day.  After icing, apply heat to the affected area as often as told by your health care provider. Use the heat source that your health care provider recommends, such as a moist heat pack or a heating pad.  Place a towel between your skin and the heat source.  Leave the heat on for 20-30 minutes.  Remove the heat if your skin turns bright red. This is especially important if you are unable to feel pain, heat, or cold. You may have a greater risk of getting burned.  Try soaking in a warm tub.  Take over-the-counter and prescription medicines only as told by your health care provider.  Keep all follow-up visits as told by your health care provider. This is important. Contact a health care provider if:  You have pain that is not relieved with  rest or medicine. Get help right away if:  You have weakness or numbness in one or both of your legs or feet.  You have trouble controlling your bladder or your bowels.  You have nausea or vomiting.  You have pain in your abdomen.  You have shortness of breath or you faint. This information is not intended to replace advice given to you by your health care provider. Make sure you discuss any questions you have with your health care provider. Document Released: 08/20/2004 Document Revised: 11/21/2015 Document Reviewed: 12/31/2014 Elsevier Interactive Patient Education  2017 [employer].

## 2016-07-27 NOTE — MAU Note (Signed)
Urine in lab 

## 2016-07-27 NOTE — MAU Note (Signed)
Pt states she has trouble breathing sometimes, she has tachycardia and has an appointment on the 8th for an ECG.  She has lower back pain and it is going down her left side, her toes feel numb.  Denies LOF/VB.

## 2016-07-27 NOTE — L&D Delivery Note (Signed)
Delivery Note Patient presented this morning for IOL for cholestasis.  She was induced with a foley bulb and 1 cytotec.  She progressed to complete without augmentation.  At 4:27 PM a viable female was delivered via Vaginal, Spontaneous Delivery (Presentation: ROA).  APGAR: 9, 9; weight pending.   Placenta status: spontaneous, intact.  Active management of 3rd stage of labor.  Cord: 3VC with no complications.  Anesthesia: epidural - catheter will be left in place until BTL tomorrow. Episiotomy: None Lacerations: None Est. Blood Loss (mL): 200  Mom to postpartum.  Baby to Couplet care / Skin to Skin.  Lulu Riding 10/21/2016, 4:41 PM

## 2016-07-27 NOTE — MAU Provider Note (Signed)
History   [id number] @ 24.5 wks in with multiple vague complaints, none of which are pregnancy related except for her complaint of sciatic pain. Denies ROM or vag bleeding,   CSN: [id number]  Arrival date & time 07/27/16  1749   None     No chief complaint on file.   HPI  Past Medical History:  Diagnosis Date  . Abortion 08/04/2011  . Allergy   . Anemia   . Anxiety   . Chronic kidney disease    kidney infection.  . Depression   . Dysuria   . Fibromyalgia   . Fibromyalgia   . Headache(784.0)   . Hypothyroid    history of.  . Mitral valve prolapse   . RA (rheumatoid arthritis) ([place])     Past Surgical History:  Procedure Laterality Date  . LAPAROSCOPY N/A 05/25/2014   Procedure: LAPAROSCOPY DIAGNOSTIC;  Surgeon: Osborne Oman, MD;  Location: [facility];  Service: Gynecology;  Laterality: N/A;  . THERAPEUTIC ABORTION N/A 2012    Family History  Problem Relation Age of Onset  . Cancer Mother     SKIN  . Hyperlipidemia Mother   . Thyroid disease Mother   . Heart disease Mother   . Mitral valve prolapse Mother   . Heart attack Mother   . Asthma Brother   . Diabetes Maternal Grandmother   . Hyperlipidemia Maternal Grandmother   . Emphysema Paternal Grandfather     Social History  Substance Use Topics  . Smoking status: Current Every Day Smoker    Packs/day: 0.50    Types: Cigarettes  . Smokeless tobacco: Never Used  . Alcohol use 0.0 oz/week     Comment: occasional but not while pregnant    OB History    Gravida Para Term Preterm AB Living   6 3 3  0 2 3   SAB TAB Ectopic Multiple Live Births     2 0 0 3      Review of Systems  Constitutional: Negative.   HENT: Negative.   Eyes: Negative.   Respiratory: Positive for shortness of breath.   Cardiovascular: Negative.   Gastrointestinal: Negative.   Endocrine: Negative.   Genitourinary: Negative.   Musculoskeletal: Positive for back pain and myalgias.  Skin: Negative.   Neurological: Negative.    Hematological: Negative.   Psychiatric/Behavioral: Negative.     Allergies  Eggs or egg-derived products; Other; Red dye; and Sulfa antibiotics  Home Medications    BP 119/71 (BP Location: Right Arm)   Pulse (!) 124   Temp 98 F (36.7 C) (Oral)   Resp 16   LMP 02/05/2016 (Exact Date)   Physical Exam  Constitutional: She appears well-developed and well-nourished.  HENT:  Head: Normocephalic.  Eyes: Pupils are equal, round, and reactive to light.  Neck: Normal range of motion.  Cardiovascular: Normal rate, regular rhythm, normal heart sounds and intact distal pulses.   Pulmonary/Chest: Effort normal and breath sounds normal.  Abdominal: Soft. Bowel sounds are normal.  Musculoskeletal: Normal range of motion.  Neurological: She is alert. She has normal reflexes.  Skin: Skin is warm and dry.  Psychiatric: She has a normal mood and affect. Her behavior is normal. Judgment and thought content normal.    MAU Course  Procedures (including critical care time)  Labs Reviewed - No data to display No results found.   No diagnosis found.    MDM  VSS, O2 sat WNL,, FHR pattern stable, no uc's, Heart RRR, LCTAB, no obvious resp  distress. Will try flexiril for sciatic nerve pain and pt is to follow up with her rheumatologist for generalized body aches.

## 2016-08-03 ENCOUNTER — Ambulatory Visit (HOSPITAL_COMMUNITY): Payer: Medicaid Other | Attending: Cardiology

## 2016-08-03 ENCOUNTER — Other Ambulatory Visit: Payer: Self-pay

## 2016-08-03 DIAGNOSIS — R000 Tachycardia, unspecified: Secondary | ICD-10-CM | POA: Diagnosis not present

## 2016-08-03 DIAGNOSIS — Z8249 Family history of ischemic heart disease and other diseases of the circulatory system: Secondary | ICD-10-CM | POA: Insufficient documentation

## 2016-08-03 DIAGNOSIS — Z720 Tobacco use: Secondary | ICD-10-CM | POA: Diagnosis not present

## 2016-08-03 DIAGNOSIS — I341 Nonrheumatic mitral (valve) prolapse: Secondary | ICD-10-CM | POA: Insufficient documentation

## 2016-08-04 ENCOUNTER — Ambulatory Visit (INDEPENDENT_AMBULATORY_CARE_PROVIDER_SITE_OTHER): Payer: Medicaid Other | Admitting: Obstetrics and Gynecology

## 2016-08-04 VITALS — BP 118/76 | HR 137 | Temp 98.5°F | Wt 133.0 lb

## 2016-08-04 DIAGNOSIS — O26892 Other specified pregnancy related conditions, second trimester: Secondary | ICD-10-CM

## 2016-08-04 DIAGNOSIS — O99019 Anemia complicating pregnancy, unspecified trimester: Secondary | ICD-10-CM | POA: Insufficient documentation

## 2016-08-04 DIAGNOSIS — O9989 Other specified diseases and conditions complicating pregnancy, childbirth and the puerperium: Secondary | ICD-10-CM

## 2016-08-04 DIAGNOSIS — O99012 Anemia complicating pregnancy, second trimester: Secondary | ICD-10-CM

## 2016-08-04 DIAGNOSIS — M549 Dorsalgia, unspecified: Secondary | ICD-10-CM | POA: Insufficient documentation

## 2016-08-04 DIAGNOSIS — D649 Anemia, unspecified: Secondary | ICD-10-CM

## 2016-08-04 DIAGNOSIS — R000 Tachycardia, unspecified: Secondary | ICD-10-CM

## 2016-08-04 DIAGNOSIS — Z3480 Encounter for supervision of other normal pregnancy, unspecified trimester: Secondary | ICD-10-CM

## 2016-08-04 DIAGNOSIS — O99891 Other specified diseases and conditions complicating pregnancy: Secondary | ICD-10-CM

## 2016-08-04 MED ORDER — FERROUS SULFATE 325 (65 FE) MG PO TABS: 325.0000 mg | ORAL_TABLET | Freq: Two times a day (BID) | ORAL | 1 refills | Status: DC

## 2016-08-04 NOTE — Progress Notes (Signed)
Patient complains of back pain related to sciatic nerve pain, reports good fetal movement.

## 2016-08-04 NOTE — Progress Notes (Signed)
Subjective:  Eileen [name] is a 26 y.o. [phone] at [redacted]w[redacted]d being seen today for ongoing prenatal care.  She is currently monitored for the following issues for this high-risk pregnancy and has Goiter, unspecified; Anxiety; Depression with anxiety; Rheumatoid arthritis ([place]); Tobacco abuse; Supervision of normal pregnancy, antepartum; Tachycardia; Anemia affecting pregnancy; and Back pain affecting pregnancy on her problem list.  Patient reports backache.  Contractions: Irregular. Vag. Bleeding: None.  Movement: Present. Denies leaking of fluid.   The following portions of the patient's history were reviewed and updated as appropriate: allergies, current medications, past family history, past medical history, past social history, past surgical history and problem list. Problem list updated.  Objective:   Vitals:   08/04/16 0940  BP: 118/76  Pulse: (!) 137  Temp: 98.5 F (36.9 C)  Weight: 133 lb (60.3 kg)    Fetal Status: Fetal Heart Rate (bpm): 140   Movement: Present     General:  Alert, oriented and cooperative. Patient is in no acute distress.  Skin: Skin is warm and dry. No rash noted.   Cardiovascular: Normal heart rate noted  Respiratory: Normal respiratory effort, no problems with respiration noted  Abdomen: Soft, gravid, appropriate for gestational age. Pain/Pressure: Present     Pelvic:  Cervical exam deferred        Extremities: Normal range of motion.  Edema: None  Mental Status: Normal mood and affect. Normal behavior. Normal judgment and thought content.   Urinalysis:      Assessment and Plan:  Pregnancy: [id number] at [redacted]w[redacted]d  1. Supervision of other normal pregnancy, antepartum Glucola next visit Desires BTL, sign papers at next visit  2. Tachycardia Has seen cards, not taking B blocker, causes drowsiness Has f/u appt with cards this week, advise to discuss with them  3. Anemia affecting pregnancy in second trimester - ferrous sulfate (FERROUSUL) 325 (65 FE) MG  tablet; Take 1 tablet (325 mg total) by mouth 2 (two) times daily.  Dispense: 60 tablet; Refill: 1  4. Back pain affecting pregnancy in second trimester Flexeril PRN  Preterm labor symptoms and general obstetric precautions including but not limited to vaginal bleeding, contractions, leaking of fluid and fetal movement were reviewed in detail with the patient. Please refer to After Visit Summary for other counseling recommendations.  No Follow-up on file.   Chancy Milroy, MD

## 2016-08-04 NOTE — Patient Instructions (Signed)
Back Exercises Introduction If you have pain in your back, do these exercises 2-3 times each day or as told by your doctor. When the pain goes away, do the exercises once each day, but repeat the steps more times for each exercise (do more repetitions). If you do not have pain in your back, do these exercises once each day or as told by your doctor. Exercises Single Knee to Chest  Do these steps 3-5 times in a row for each leg: 1. Lie on your back on a firm bed or the floor with your legs stretched out. 2. Bring one knee to your chest. 3. Hold your knee to your chest by grabbing your knee or thigh. 4. Pull on your knee until you feel a gentle stretch in your lower back. 5. Keep doing the stretch for 10-30 seconds. 6. Slowly let go of your leg and straighten it. Pelvic Tilt  Do these steps 5-10 times in a row: 1. Lie on your back on a firm bed or the floor with your legs stretched out. 2. Bend your knees so they point up to the ceiling. Your feet should be flat on the floor. 3. Tighten your lower belly (abdomen) muscles to press your lower back against the floor. This will make your tailbone point up to the ceiling instead of pointing down to your feet or the floor. 4. Stay in this position for 5-10 seconds while you gently tighten your muscles and breathe evenly. Cat-Cow  Do these steps until your lower back bends more easily: 1. Get on your hands and knees on a firm surface. Keep your hands under your shoulders, and keep your knees under your hips. You may put padding under your knees. 2. Let your head hang down, and make your tailbone point down to the floor so your lower back is round like the back of a cat. 3. Stay in this position for 5 seconds. 4. Slowly lift your head and make your tailbone point up to the ceiling so your back hangs low (sags) like the back of a cow. 5. Stay in this position for 5 seconds. Press-Ups  Do these steps 5-10 times in a row: 1. Lie on your belly  (face-down) on the floor. 2. Place your hands near your head, about shoulder-width apart. 3. While you keep your back relaxed and keep your hips on the floor, slowly straighten your arms to raise the top half of your body and lift your shoulders. Do not use your back muscles. To make yourself more comfortable, you may change where you place your hands. 4. Stay in this position for 5 seconds. 5. Slowly return to lying flat on the floor. Bridges  Do these steps 10 times in a row: 1. Lie on your back on a firm surface. 2. Bend your knees so they point up to the ceiling. Your feet should be flat on the floor. 3. Tighten your butt muscles and lift your butt off of the floor until your waist is almost as high as your knees. If you do not feel the muscles working in your butt and the back of your thighs, slide your feet 1-2 inches farther away from your butt. 4. Stay in this position for 3-5 seconds. 5. Slowly lower your butt to the floor, and let your butt muscles relax. If this exercise is too easy, try doing it with your arms crossed over your chest. Belly Crunches  Do these steps 5-10 times in a row: 1. Lie   on your back on a firm bed or the floor with your legs stretched out. 2. Bend your knees so they point up to the ceiling. Your feet should be flat on the floor. 3. Cross your arms over your chest. 4. Tip your chin a little bit toward your chest but do not bend your neck. 5. Tighten your belly muscles and slowly raise your chest just enough to lift your shoulder blades a tiny bit off of the floor. 6. Slowly lower your chest and your head to the floor. Back Lifts  Do these steps 5-10 times in a row: 1. Lie on your belly (face-down) with your arms at your sides, and rest your forehead on the floor. 2. Tighten the muscles in your legs and your butt. 3. Slowly lift your chest off of the floor while you keep your hips on the floor. Keep the back of your head in line with the curve in your back.  Look at the floor while you do this. 4. Stay in this position for 3-5 seconds. 5. Slowly lower your chest and your face to the floor. Contact a doctor if:  Your back pain gets a lot worse when you do an exercise.  Your back pain does not lessen 2 hours after you exercise. If you have any of these problems, stop doing the exercises. Do not do them again unless your doctor says it is okay. Get help right away if:  You have sudden, very bad back pain. If this happens, stop doing the exercises. Do not do them again unless your doctor says it is okay. This information is not intended to replace advice given to you by your health care provider. Make sure you discuss any questions you have with your health care provider. Document Released: 08/15/2010 Document Revised: 12/19/2015 Document Reviewed: 09/06/2014  2017 Elsevier Back Pain in Pregnancy Introduction Back pain during pregnancy is common. Back pain may be caused by several factors that are related to changes during your pregnancy. Follow these instructions at home: Managing pain, stiffness, and swelling  If directed, apply ice for sudden (acute) back pain.  Put ice in a plastic bag.  Place a towel between your skin and the bag.  Leave the ice on for 20 minutes, 2-3 times per day.  If directed, apply heat to the affected area before you exercise:  Place a towel between your skin and the heat pack or heating pad.  Leave the heat on for 20-30 minutes.  Remove the heat if your skin turns bright red. This is especially important if you are unable to feel pain, heat, or cold. You may have a greater risk of getting burned. Activity  Exercise as told by your health care provider. Exercising is the best way to prevent or manage back pain.  Listen to your body when lifting. If lifting hurts, ask for help or bend your knees. This uses your leg muscles instead of your back muscles.  Squat down when picking up something from the floor.  Do not bend over.  Only use bed rest as told by your health care provider. Bed rest should only be used for the most severe episodes of back pain. Standing, Sitting, and Lying Down  Do not stand in one place for long periods of time.  Use good posture when sitting. Make sure your head rests over your shoulders and is not hanging forward. Use a pillow on your lower back if necessary.  Try sleeping on your side, preferably  the left side, with a pillow or two between your legs. If you are sore after a night's rest, your bed may be too soft. A firm mattress may provide more support for your back during pregnancy. General instructions  Do not wear high heels.  Eat a healthy diet. Try to gain weight within your health care provider's recommendations.  Use a maternity girdle, elastic sling, or back brace as told by your health care provider.  Take over-the-counter and prescription medicines only as told by your health care provider.  Keep all follow-up visits as told by your health care provider. This is important. This includes any visits with any specialists, such as a physical therapist. Contact a health care provider if:  Your back pain interferes with your daily activities.  You have increasing pain in other parts of your body. Get help right away if:  You develop numbness, tingling, weakness, or problems with the use of your arms or legs.  You develop severe back pain that is not controlled with medicine.  You have a sudden change in bowel or bladder control.  You develop shortness of breath, dizziness, or you faint.  You develop nausea, vomiting, or sweating.  You have back pain that is a rhythmic, cramping pain similar to labor pains. Labor pain is usually 1-2 minutes apart, lasts for about 1 minute, and involves a bearing down feeling or pressure in your pelvis.  You have back pain and your water breaks or you have vaginal bleeding.  You have back pain or numbness that  travels down your leg.  Your back pain developed after you fell.  You develop pain on one side of your back.  You see blood in your urine.  You develop skin blisters in the area of your back pain. This information is not intended to replace advice given to you by your health care provider. Make sure you discuss any questions you have with your health care provider. Document Released: 10/21/2005 Document Revised: 12/19/2015 Document Reviewed: 03/27/2015  2017 Elsevier

## 2016-08-18 ENCOUNTER — Ambulatory Visit: Payer: Medicaid Other | Admitting: Physician Assistant

## 2016-08-18 NOTE — Progress Notes (Deleted)
Cardiology Office Note   Date:  08/18/2016   ID:  Eileen [name], DOB 17-Aug-1990, MRN [id number]  PCP:  No PCP Per Patient  Cardiologist:  Dr. Oval Linsey 07/08/2016 Crytal Pensinger, Suanne Marker, PA-C   No chief complaint on file.   History of Present Illness: Eileen [name] is a 26 y.o. female with a history of RA, Hashimoto's thyroiditis is, tobacco abuse, tachycardia, reported MVP but not seen on echo, sinus tachycardia  Eileen [name] presents for ***   Past Medical History:  Diagnosis Date  . Abortion 08/04/2011  . Allergy   . Anemia   . Anxiety   . Chronic kidney disease    kidney infection.  . Depression   . Dysuria   . Fibromyalgia   . Fibromyalgia   . Headache(784.0)   . Hypothyroid    history of.  . Mitral valve prolapse   . RA (rheumatoid arthritis) ([place])     Past Surgical History:  Procedure Laterality Date  . LAPAROSCOPY N/A 05/25/2014   Procedure: LAPAROSCOPY DIAGNOSTIC;  Surgeon: Osborne Oman, MD;  Location: [facility];  Service: Gynecology;  Laterality: N/A;  . THERAPEUTIC ABORTION N/A 2012    Current Outpatient Prescriptions  Medication Sig Dispense Refill  . acetaminophen (TYLENOL) 500 MG tablet Take 500 mg by mouth daily as needed for mild pain or headache.     . cyclobenzaprine (FLEXERIL) 10 MG tablet Take 1 tablet (10 mg total) by mouth 2 (two) times daily as needed for muscle spasms. (Patient not taking: Reported on 08/04/2016) 20 tablet 0  . ferrous sulfate (FERROUSUL) 325 (65 FE) MG tablet Take 1 tablet (325 mg total) by mouth 2 (two) times daily. 60 tablet 1  . metoprolol tartrate (LOPRESSOR) 25 MG tablet 1/2 TABLET BY MOUTH TWICE A DAY (Patient not taking: Reported on 08/04/2016) 30 tablet 5  . Prenatal MV-Min-FA-Omega-3 (PRENATAL GUMMIES/DHA & FA) 0.4-32.5 MG CHEW Chew by mouth.     No current facility-administered medications for this visit.     Allergies:   Eggs or egg-derived products; Other; Red dye; and Sulfa antibiotics    Social History:  The  patient  reports that she has been smoking Cigarettes.  She has been smoking about 0.50 packs per day. She has never used smokeless tobacco. She reports that she drinks alcohol. She reports that she does not use drugs.   Family History:  The patient's family history includes Asthma in her brother; Cancer in her mother; Diabetes in her maternal grandmother; Emphysema in her paternal grandfather; Heart attack in her mother; Heart disease in her mother; Hyperlipidemia in her maternal grandmother and mother; Mitral valve prolapse in her mother; Thyroid disease in her mother.    ROS:  Please see the history of present illness. All other systems are reviewed and negative.    PHYSICAL EXAM: VS:  LMP 02/05/2016 (Exact Date)  , BMI There is no height or weight on file to calculate BMI. GEN: Well nourished, well developed, female in no acute distress  HEENT: normal for age  Neck: no JVD, no carotid bruit, no masses Cardiac: RRR; no murmur, no rubs, or gallops Respiratory:  clear to auscultation bilaterally, normal work of breathing GI: soft, nontender, nondistended, + BS MS: no deformity or atrophy; no edema; distal pulses are 2+ in all 4 extremities   Skin: warm and dry, no rash Neuro:  Strength and sensation are intact Psych: euthymic mood, full affect   EKG:  EKG {ACTION; IS/IS [id number] ordered today. The ekg ordered  today demonstrates ***  ECHO: 08/03/2016 - Left ventricle: The cavity size was normal. Wall thickness was   normal. Systolic function was vigorous. The estimated ejection   fraction was in the range of 65% to 70%. Wall motion was normal;   there were no regional wall motion abnormalities. Doppler   parameters are consistent with abnormal left ventricular   relaxation (grade 1 diastolic dysfunction). - Aortic valve: There was no stenosis. - Mitral valve: There was no significant regurgitation. - Right ventricle: The cavity size was normal. Systolic function   was  normal. - Pulmonary arteries: No complete TR doppler jet so unable to   estimate PA systolic pressure. - Inferior vena cava: The vessel was normal in size. The   respirophasic diameter changes were in the normal range (>= 50%),   consistent with normal central venous pressure. Impressions: - Sinus tachycardia. Normal LV size with EF 65-70%. Normal RV size   and systolic function. No significant valvular abnormalities.   Recent Labs: 06/15/2016: ALT 24 07/07/2016: TSH 3.800 07/08/2016: BUN 4; Creat 0.43; Hemoglobin 8.7; Magnesium 1.7; Platelets 249; Potassium 4.1; Sodium 138    Lipid Panel    Component Value Date/Time   CHOL 212 (H) 06/28/2014 0954   TRIG 62.0 06/28/2014 0954   HDL 32.50 (L) 06/28/2014 0954   CHOLHDL 7 06/28/2014 0954   VLDL 12.4 06/28/2014 0954   LDLCALC 167 (H) 06/28/2014 0954     Wt Readings from Last 3 Encounters:  08/04/16 133 lb (60.3 kg)  07/08/16 126 lb 3.2 oz (57.2 kg)  07/07/16 124 lb (56.2 kg)     Other studies Reviewed: Additional studies/ records that were reviewed today include: ***.  ASSESSMENT AND PLAN:  1.  ***   Current medicines are reviewed at length with the patient today.  The patient {ACTIONS; HAS/DOES NOT HAVE:19233} concerns regarding medicines.  The following changes have been made:  {PLAN; NO CHANGE:13088:s}  Labs/ tests ordered today include: *** No orders of the defined types were placed in this encounter.    Disposition:   FU with ***  Signed, Rosaria Ferries, PA-C  08/18/2016 8:00 AM    [facility] Phone: [phone]; Fax: [phone]  This note was written with the assistance of speech recognition software. Please excuse any transcriptional errors.

## 2016-08-19 ENCOUNTER — Encounter: Payer: Self-pay | Admitting: *Deleted

## 2016-08-20 ENCOUNTER — Telehealth: Payer: Self-pay | Admitting: *Deleted

## 2016-08-20 NOTE — Telephone Encounter (Signed)
Pt called to office stating that she has developed a rash on her abdomen, back and would like to know what to do.  Attempt to contact pt. No answer, LM on VM to call office.

## 2016-08-20 NOTE — Telephone Encounter (Signed)
Spoke with pt regarding rash that she has developed. Pt states rash is on her abdomen, chest and now back. Pt states rash is very itchy. Per R.Denney, pt is to take Benadryl.  Pt has appt for 2GTT and ROB on Tuesday. Rachelle would like to add LFT and Bile Acid to lab draw.  Pt has been made aware. Pt advised to be seen at [facility] if needed or symptoms worsen.

## 2016-08-25 ENCOUNTER — Other Ambulatory Visit: Payer: Medicaid Other

## 2016-08-25 ENCOUNTER — Ambulatory Visit (INDEPENDENT_AMBULATORY_CARE_PROVIDER_SITE_OTHER): Payer: Medicaid Other | Admitting: Obstetrics & Gynecology

## 2016-08-25 DIAGNOSIS — Z23 Encounter for immunization: Secondary | ICD-10-CM | POA: Diagnosis not present

## 2016-08-25 DIAGNOSIS — Z3480 Encounter for supervision of other normal pregnancy, unspecified trimester: Secondary | ICD-10-CM

## 2016-08-25 DIAGNOSIS — Z3490 Encounter for supervision of normal pregnancy, unspecified, unspecified trimester: Secondary | ICD-10-CM

## 2016-08-25 DIAGNOSIS — Z3483 Encounter for supervision of other normal pregnancy, third trimester: Secondary | ICD-10-CM

## 2016-08-25 NOTE — Progress Notes (Signed)
   PRENATAL VISIT NOTE  Subjective:  Eileen [name] is a 26 y.o. [phone] at [redacted]w[redacted]d being seen today for ongoing prenatal care.  She is currently monitored for the following issues for this low-risk pregnancy and has Goiter, unspecified; Anxiety; Depression with anxiety; Rheumatoid arthritis ([place]); Tobacco abuse; Supervision of normal pregnancy, antepartum; Tachycardia; Anemia affecting pregnancy; and Back pain affecting pregnancy on her problem list.  Patient reports itching.  Contractions: Irregular. Vag. Bleeding: None.  Movement: Present. Denies leaking of fluid.   The following portions of the patient's history were reviewed and updated as appropriate: allergies, current medications, past family history, past medical history, past social history, past surgical history and problem list. Problem list updated.  Objective:   Vitals:   08/25/16 0915  BP: 108/72  Pulse: (!) 114  Weight: 133 lb (60.3 kg)    Fetal Status: Fetal Heart Rate (bpm): 136   Movement: Present     General:  Alert, oriented and cooperative. Patient is in no acute distress.  Skin: Skin is warm and dry. No rash noted.   Cardiovascular: Normal heart rate noted  Respiratory: Normal respiratory effort, no problems with respiration noted  Abdomen: Soft, gravid, appropriate for gestational age. Pain/Pressure: Present     Pelvic:  Cervical exam deferred        Extremities: Normal range of motion.  Edema: None  Mental Status: Normal mood and affect. Normal behavior. Normal judgment and thought content.   Assessment and Plan:  Pregnancy: [id number] at [redacted]w[redacted]d  1. Supervision of other normal pregnancy, antepartum R/o cholestasis of pregnancy - Glucose Tolerance, 2 Hours w/1 Hour - RPR - HIV antibody - CBC - Tdap vaccine greater than or equal to 7yo IM  Preterm labor symptoms and general obstetric precautions including but not limited to vaginal bleeding, contractions, leaking of fluid and fetal movement were reviewed in  detail with the patient. Please refer to After Visit Summary for other counseling recommendations.  Return in about 2 weeks (around 09/08/2016).   Woodroe Mode, MD

## 2016-08-25 NOTE — Progress Notes (Signed)
Patient states that she has contractions that come and go, reports good fetal movement.

## 2016-08-26 LAB — CBC
HEMATOCRIT: 24 % — AB (ref 34.0–46.6)
HEMOGLOBIN: 7.5 g/dL — AB (ref 11.1–15.9)
MCH: 22.1 pg — AB (ref 26.6–33.0)
MCHC: 31.3 g/dL — ABNORMAL LOW (ref 31.5–35.7)
MCV: 71 fL — AB (ref 79–97)
Platelets: 249 10*3/uL (ref 150–379)
RBC: 3.4 x10E6/uL — AB (ref 3.77–5.28)
RDW: 16.3 % — ABNORMAL HIGH (ref 12.3–15.4)
WBC: 11.7 10*3/uL — ABNORMAL HIGH (ref 3.4–10.8)

## 2016-08-26 LAB — HEPATIC FUNCTION PANEL
ALK PHOS: 169 IU/L — AB (ref 39–117)
ALT: 10 IU/L (ref 0–32)
AST: 22 IU/L (ref 0–40)
Albumin: 3.5 g/dL (ref 3.5–5.5)
BILIRUBIN TOTAL: 0.2 mg/dL (ref 0.0–1.2)
Bilirubin, Direct: 0.07 mg/dL (ref 0.00–0.40)
Total Protein: 6.3 g/dL (ref 6.0–8.5)

## 2016-08-26 LAB — HIV ANTIBODY (ROUTINE TESTING W REFLEX): HIV Screen 4th Generation wRfx: NONREACTIVE

## 2016-08-26 LAB — RPR: RPR Ser Ql: NONREACTIVE

## 2016-08-26 LAB — BILE ACIDS, TOTAL: Bile Acids Total: 3.7 umol/L — ABNORMAL LOW (ref 4.7–24.5)

## 2016-08-26 LAB — GLUCOSE TOLERANCE, 2 HOURS W/ 1HR
GLUCOSE, FASTING: 87 mg/dL (ref 65–91)
Glucose, 1 hour: 139 mg/dL (ref 65–179)
Glucose, 2 hour: 112 mg/dL (ref 65–152)

## 2016-09-08 ENCOUNTER — Encounter: Payer: Self-pay | Admitting: Obstetrics & Gynecology

## 2016-09-08 ENCOUNTER — Ambulatory Visit (INDEPENDENT_AMBULATORY_CARE_PROVIDER_SITE_OTHER): Payer: Medicaid Other | Admitting: Obstetrics & Gynecology

## 2016-09-08 VITALS — BP 110/74 | HR 113 | Wt 134.0 lb

## 2016-09-08 DIAGNOSIS — Z3483 Encounter for supervision of other normal pregnancy, third trimester: Secondary | ICD-10-CM

## 2016-09-08 DIAGNOSIS — Z3480 Encounter for supervision of other normal pregnancy, unspecified trimester: Secondary | ICD-10-CM

## 2016-09-08 MED ORDER — AMITRIPTYLINE HCL 10 MG PO TABS: 10.0000 mg | ORAL_TABLET | Freq: Every day | ORAL | 2 refills | Status: DC

## 2016-09-08 NOTE — Progress Notes (Signed)
   PRENATAL VISIT NOTE  Subjective:  Eileen [name] is a 26 y.o. [phone] at [redacted]w[redacted]d being seen today for ongoing prenatal care.  She is currently monitored for the following issues for this low-risk pregnancy and has Goiter, unspecified; Anxiety; Depression with anxiety; Rheumatoid arthritis ([place]); Tobacco abuse; Supervision of normal pregnancy, antepartum; Tachycardia; Anemia affecting pregnancy; and Back pain affecting pregnancy on her problem list.  Patient reports occasional contractions and leg pain at night and can't sleep.  Contractions: Irregular. Vag. Bleeding: None.  Movement: Present. Denies leaking of fluid.   The following portions of the patient's history were reviewed and updated as appropriate: allergies, current medications, past family history, past medical history, past social history, past surgical history and problem list. Problem list updated.  Objective:   Vitals:   09/08/16 0934  BP: 110/74  Pulse: (!) 113  Weight: 134 lb (60.8 kg)    Fetal Status: Fetal Heart Rate (bpm): 150 Fundal Height: 31 cm Movement: Present     General:  Alert, oriented and cooperative. Patient is in no acute distress.  Skin: Skin is warm and dry. No rash noted.   Cardiovascular: Normal heart rate noted  Respiratory: Normal respiratory effort, no problems with respiration noted  Abdomen: Soft, gravid, appropriate for gestational age. Pain/Pressure: Present     Pelvic:  Cervical exam deferred        Extremities: Normal range of motion.  Edema: None  Mental Status: Normal mood and affect. Normal behavior. Normal judgment and thought content.   Assessment and Plan:  Pregnancy: [id number] at [redacted]w[redacted]d  1. Supervision of other normal pregnancy, antepartum Sleep disturbance and nerve pain - amitriptyline (ELAVIL) 10 MG tablet; Take 1 tablet (10 mg total) by mouth at bedtime.  Dispense: 30 tablet; Refill: 2  Preterm labor symptoms and general obstetric precautions including but not limited to  vaginal bleeding, contractions, leaking of fluid and fetal movement were reviewed in detail with the patient. Please refer to After Visit Summary for other counseling recommendations.  Return in about 2 weeks (around 09/22/2016).   Woodroe Mode, MD

## 2016-09-16 ENCOUNTER — Encounter (HOSPITAL_COMMUNITY): Payer: Self-pay | Admitting: *Deleted

## 2016-09-16 ENCOUNTER — Inpatient Hospital Stay (HOSPITAL_COMMUNITY)
Admission: AD | Admit: 2016-09-16 | Discharge: 2016-09-16 | Disposition: A | Discharge status: Home / Self Care | Payer: Medicaid Other | Source: Ambulatory Visit | Attending: Obstetrics & Gynecology | Admitting: Obstetrics & Gynecology

## 2016-09-16 DIAGNOSIS — R102 Pelvic and perineal pain: Secondary | ICD-10-CM | POA: Diagnosis present

## 2016-09-16 DIAGNOSIS — F17210 Nicotine dependence, cigarettes, uncomplicated: Secondary | ICD-10-CM | POA: Diagnosis not present

## 2016-09-16 DIAGNOSIS — O4703 False labor before 37 completed weeks of gestation, third trimester: Secondary | ICD-10-CM | POA: Diagnosis not present

## 2016-09-16 DIAGNOSIS — O479 False labor, unspecified: Secondary | ICD-10-CM

## 2016-09-16 DIAGNOSIS — N949 Unspecified condition associated with female genital organs and menstrual cycle: Secondary | ICD-10-CM

## 2016-09-16 DIAGNOSIS — Z3A32 32 weeks gestation of pregnancy: Secondary | ICD-10-CM | POA: Insufficient documentation

## 2016-09-16 DIAGNOSIS — O99333 Smoking (tobacco) complicating pregnancy, third trimester: Secondary | ICD-10-CM | POA: Diagnosis not present

## 2016-09-16 LAB — URINALYSIS, ROUTINE W REFLEX MICROSCOPIC
Bilirubin Urine: NEGATIVE
Glucose, UA: NEGATIVE mg/dL
Hgb urine dipstick: NEGATIVE
Ketones, ur: NEGATIVE mg/dL
LEUKOCYTES UA: NEGATIVE
NITRITE: NEGATIVE
PROTEIN: NEGATIVE mg/dL
Specific Gravity, Urine: 1.005 (ref 1.005–1.030)
pH: 8 (ref 5.0–8.0)

## 2016-09-16 LAB — WET PREP, GENITAL
Sperm: NONE SEEN
Trich, Wet Prep: NONE SEEN
WBC, Wet Prep HPF POC: NONE SEEN
Yeast Wet Prep HPF POC: NONE SEEN

## 2016-09-16 NOTE — MAU Provider Note (Signed)
Chief Complaint: No chief complaint on file.   First Provider Initiated Contact with Patient 09/16/16 1130     SUBJECTIVE HPI: Eileen [name] is a 26 y.o. [id number] at [redacted]w[redacted]d who presents to Maternity Admissions reporting pelvic pressure with tachycardia in 120s. Has h/o tachycardia followed by Cardiologist and was started on Lopressor 12.5 mg BID but did not take the medication due to side effect of drowsiness. Pt did not report this to cardiologist and missed 3 month f/u due to family getting flu. Pt denies h/o CP, SOB. Denies sensation of contraction, leakage of fluid, or bleeding. Reports good fetal movements. Main complaint is pelvic fullness radiating to lumbar region. No dysuria, fevers or chills. Has h/a relieved with tylenol at home, denies LE edema.  Location: pelvis, lower Quality: fullness Severity: 4/10 in pain scale Duration: intermitent Context: pregnancy Timing: yesterday Modifying factors: none Associated signs and symptoms: radiates to lower back  Pregnancy Course:   Past Medical History:  Diagnosis Date  . Abortion 08/04/2011  . Allergy   . Anemia   . Anxiety   . Chronic kidney disease    kidney infection.  . Depression   . Dysuria   . Fibromyalgia   . Fibromyalgia   . Headache(784.0)   . Hypothyroid    history of.  . Mitral valve prolapse   . RA (rheumatoid arthritis) (H[facility])    OB History  Gravida Para Term Preterm AB Living  6 3 3  0 2 3  SAB TAB Ectopic Multiple Live Births    2 0 0 3    # Outcome Date GA Lbr Len/2nd Weight Sex Delivery Anes PTL Lv  6 Current           5 Term 06/16/13 [redacted]w[redacted]d 19:30 / 00:31 6 lb 1.9 oz (2.775 kg) F Vag-Spont EPI  LIV  4 TAB 2013          3 Term 01/01/10 [redacted]w[redacted]d  7 lb 3 oz (3.26 kg) F Vag-Spont  N LIV  2 Term 08/2008   6 lb 11 oz (3.033 kg) F Vag-Spont  N LIV  1 TAB 2008             Past Surgical History:  Procedure Laterality Date  . LAPAROSCOPY N/A 05/25/2014   Procedure: LAPAROSCOPY DIAGNOSTIC;  Surgeon: Osborne Oman, MD;  Location: [facility];  Service: Gynecology;  Laterality: N/A;  . THERAPEUTIC ABORTION N/A 2012   Family History  Problem Relation Age of Onset  . Cancer Mother     SKIN  . Hyperlipidemia Mother   . Thyroid disease Mother   . Heart disease Mother   . Mitral valve prolapse Mother   . Heart attack Mother   . Asthma Brother   . Diabetes Maternal Grandmother   . Hyperlipidemia Maternal Grandmother   . Emphysema Paternal Grandfather    Social History  Substance Use Topics  . Smoking status: Current Every Day Smoker    Packs/day: 0.50    Types: Cigarettes  . Smokeless tobacco: Never Used  . Alcohol use 0.0 oz/week     Comment: occasional but not while pregnant   Allergies  Allergen Reactions  . Eggs Or Egg-Derived Products Nausea Only  . Other Hives and Nausea And Vomiting    Green beans  . Red Dye Nausea And Vomiting  . Sulfa Antibiotics Hives and Nausea And Vomiting    Childhood reaction   No prescriptions prior to admission.    I have reviewed patient's Past Medical Hx,  Surgical Hx, Family Hx, Social Hx, medications and allergies.   ROS:  Review of Systems  Constitutional: Negative for chills, diaphoresis, fatigue and fever.  HENT: Negative for congestion, sinus pain and sore throat.   Respiratory: Negative for cough, chest tightness, shortness of breath and wheezing.   Cardiovascular: Negative for chest pain, palpitations and leg swelling.  Gastrointestinal: Negative for constipation, diarrhea, nausea and vomiting.  Genitourinary: Positive for pelvic pain. Negative for dysuria, flank pain, hematuria, urgency, vaginal bleeding and vaginal discharge.  Musculoskeletal: Negative for myalgias and neck pain.  Skin: Negative for color change and rash.  Neurological: Negative for weakness and headaches.    Physical Exam   Patient Vitals for the past 24 hrs:  BP Temp Temp src Pulse Resp SpO2 Height Weight  09/16/16 1252 105/58 - - 120 16 - - -  09/16/16 1105  112/62 98.2 F (36.8 C) Oral (!) 121 18 99 % 4\' 10"  (1.473 m) 134 lb (60.8 kg)   Constitutional: Well-developed, well-nourished female in no acute distress.  Cardiovascular: normal rate Respiratory: normal effort GI: Abd soft, non-tender, gravid appropriate for gestational age. Pos BS x 4 MS: Extremities nontender, no edema, normal ROM Neurologic: Alert and oriented x 4.  GU: Neg CVAT.  Pelvic: NEFG, physiologic discharge, no blood, cervix clean. No CMT  Dilation: Closed (1 external, closed internal) Exam by:: Ramondo Dietze,CNM  FHT:  Baseline 135 bpm, moderate variability, accelerations present, no decelerations Contractions: rare    Labs: Results for orders placed or performed during the hospital encounter of 09/16/16 (from the past 24 hour(s))  Urinalysis, Routine w reflex microscopic     Status: None   Collection Time: 09/16/16 11:00 AM  Result Value Ref Range   Color, Urine YELLOW YELLOW   APPearance CLEAR CLEAR   Specific Gravity, Urine 1.005 1.005 - 1.030   pH 8.0 5.0 - 8.0   Glucose, UA NEGATIVE NEGATIVE mg/dL   Hgb urine dipstick NEGATIVE NEGATIVE   Bilirubin Urine NEGATIVE NEGATIVE   Ketones, ur NEGATIVE NEGATIVE mg/dL   Protein, ur NEGATIVE NEGATIVE mg/dL   Nitrite NEGATIVE NEGATIVE   Leukocytes, UA NEGATIVE NEGATIVE  Wet prep, genital     Status: Abnormal   Collection Time: 09/16/16 12:26 PM  Result Value Ref Range   Yeast Wet Prep HPF POC NONE SEEN NONE SEEN   Trich, Wet Prep NONE SEEN NONE SEEN   Clue Cells Wet Prep HPF POC PRESENT (A) NONE SEEN   WBC, Wet Prep HPF POC NONE SEEN NONE SEEN   Sperm NONE SEEN     Imaging:  No results found.  MAU Course: Orders Placed This Encounter  Procedures  . Wet prep, genital  . Urinalysis, Routine w reflex microscopic  . ED EKG  . EKG 12-Lead  . Discharge patient   No orders of the defined types were placed in this encounter.   MDM:  Assessment: 1. Braxton Hicks contractions   2. [redacted] weeks gestation of pregnancy    3. Round ligament pain     Plan: Discharge home in stable condition. Reassuring fetal strip. No signs of active infection with neg UA. Remains afebrile and does not appear toxic. Will need to f/u with cardiologist on d/c. Told to call and consider changing therapy given side effects. Follow-up [place] Follow up.   Specialty:  Obstetrics and Gynecology Contact information: [address], [place]  Oval Linsey, MD Follow up.   Specialty:  Cardiology Why:  Call to reschedule your follow up appointment that was missed. Discuss with them the side effects from your medication  Contact information: [address] [place] [address] [phone]           Allergies as of 09/16/2016      Reactions   Eggs Or Egg-derived Products Nausea Only   Other Hives, Nausea And Vomiting   Green beans   Red Dye Nausea And Vomiting   Sulfa Antibiotics Hives, Nausea And Vomiting   Childhood reaction      Medication List    TAKE these medications   acetaminophen 500 MG tablet Commonly known as:  TYLENOL Take 500 mg by mouth daily as needed for mild pain or headache.   amitriptyline 10 MG tablet Commonly known as:  ELAVIL Take 1 tablet (10 mg total) by mouth at bedtime.   cyclobenzaprine 10 MG tablet Commonly known as:  FLEXERIL Take 1 tablet (10 mg total) by mouth 2 (two) times daily as needed for muscle spasms.   ferrous sulfate 325 (65 FE) MG tablet Commonly known as:  FERROUSUL Take 1 tablet (325 mg total) by mouth 2 (two) times daily.   PRENATAL GUMMIES/DHA & FA 0.4-32.5 MG Chew Chew 2 tablets by mouth daily.       Oxford Bing, DO, PGY-1 09/16/2016, 3:50 PM  CNM attestation:  I have seen and examined this patient; I agree with above documentation in the resident's note.   Eileen [name] is a 26 y.o. [phone] at [redacted]w[redacted]d  reporting pelvic pressure and  possible contractions +FM, denies LOF, VB, vaginal discharge.  PE: BP 105/58 (BP Location: Left Arm)   Pulse 120   Temp 98.2 F (36.8 C) (Oral)   Resp 16   Ht 4\' 10"  (1.473 m)   Wt 134 lb (60.8 kg)   LMP 02/05/2016 (Exact Date)   SpO2 99%   BMI 28.01 kg/m  Gen: calm comfortable, NAD Resp: normal effort, no distress Abd: gravid  ROS, labs, PMH reviewed NST Category 1 FHT 135, moderate with 15x15 accels, no decels Toco: rare UCs   Plan: - fetal kick counts reinforced, preterm labor precautions - continue routine follow up in OB clinic  Mathis Bud, CNM 4:13 PM

## 2016-09-16 NOTE — Discharge Instructions (Signed)

## 2016-09-16 NOTE — MAU Note (Signed)
Pt reports contractions and tightening for 2-3 weeks but has worsened the last 2 days. Also reports pressure and dysuria.

## 2016-09-17 LAB — GC/CHLAMYDIA PROBE AMP (~~LOC~~) NOT AT ARMC
Chlamydia: NEGATIVE
NEISSERIA GONORRHEA: NEGATIVE

## 2016-09-18 ENCOUNTER — Encounter (HOSPITAL_COMMUNITY): Payer: Self-pay | Admitting: *Deleted

## 2016-09-18 ENCOUNTER — Inpatient Hospital Stay (HOSPITAL_COMMUNITY)
Admission: AD | Admit: 2016-09-18 | Discharge: 2016-09-18 | Disposition: A | Discharge status: Home / Self Care | Payer: Medicaid Other | Source: Ambulatory Visit | Attending: Obstetrics & Gynecology | Admitting: Obstetrics & Gynecology

## 2016-09-18 DIAGNOSIS — O99013 Anemia complicating pregnancy, third trimester: Secondary | ICD-10-CM

## 2016-09-18 DIAGNOSIS — O219 Vomiting of pregnancy, unspecified: Secondary | ICD-10-CM

## 2016-09-18 DIAGNOSIS — O26893 Other specified pregnancy related conditions, third trimester: Secondary | ICD-10-CM | POA: Diagnosis not present

## 2016-09-18 DIAGNOSIS — O99891 Other specified diseases and conditions complicating pregnancy: Secondary | ICD-10-CM

## 2016-09-18 DIAGNOSIS — Z3A32 32 weeks gestation of pregnancy: Secondary | ICD-10-CM | POA: Diagnosis not present

## 2016-09-18 DIAGNOSIS — I341 Nonrheumatic mitral (valve) prolapse: Secondary | ICD-10-CM | POA: Diagnosis not present

## 2016-09-18 DIAGNOSIS — O26833 Pregnancy related renal disease, third trimester: Secondary | ICD-10-CM | POA: Diagnosis not present

## 2016-09-18 DIAGNOSIS — O99343 Other mental disorders complicating pregnancy, third trimester: Secondary | ICD-10-CM | POA: Insufficient documentation

## 2016-09-18 DIAGNOSIS — F17210 Nicotine dependence, cigarettes, uncomplicated: Secondary | ICD-10-CM | POA: Diagnosis not present

## 2016-09-18 DIAGNOSIS — M797 Fibromyalgia: Secondary | ICD-10-CM | POA: Insufficient documentation

## 2016-09-18 DIAGNOSIS — O99333 Smoking (tobacco) complicating pregnancy, third trimester: Secondary | ICD-10-CM | POA: Diagnosis not present

## 2016-09-18 DIAGNOSIS — R109 Unspecified abdominal pain: Secondary | ICD-10-CM

## 2016-09-18 DIAGNOSIS — O212 Late vomiting of pregnancy: Secondary | ICD-10-CM | POA: Diagnosis present

## 2016-09-18 DIAGNOSIS — M549 Dorsalgia, unspecified: Secondary | ICD-10-CM

## 2016-09-18 DIAGNOSIS — M069 Rheumatoid arthritis, unspecified: Secondary | ICD-10-CM | POA: Insufficient documentation

## 2016-09-18 DIAGNOSIS — O9989 Other specified diseases and conditions complicating pregnancy, childbirth and the puerperium: Secondary | ICD-10-CM | POA: Diagnosis not present

## 2016-09-18 DIAGNOSIS — O99283 Endocrine, nutritional and metabolic diseases complicating pregnancy, third trimester: Secondary | ICD-10-CM | POA: Insufficient documentation

## 2016-09-18 DIAGNOSIS — O26899 Other specified pregnancy related conditions, unspecified trimester: Secondary | ICD-10-CM

## 2016-09-18 LAB — CBC
HCT: 22.5 % — ABNORMAL LOW (ref 36.0–46.0)
Hemoglobin: 6.8 g/dL — CL (ref 12.0–15.0)
MCH: 20.6 pg — AB (ref 26.0–34.0)
MCHC: 30.2 g/dL (ref 30.0–36.0)
MCV: 68.2 fL — AB (ref 78.0–100.0)
PLATELETS: 199 10*3/uL (ref 150–400)
RBC: 3.3 MIL/uL — AB (ref 3.87–5.11)
RDW: 17.1 % — ABNORMAL HIGH (ref 11.5–15.5)
WBC: 12 10*3/uL — ABNORMAL HIGH (ref 4.0–10.5)

## 2016-09-18 LAB — COMPREHENSIVE METABOLIC PANEL
ALT: 8 U/L — AB (ref 14–54)
AST: 19 U/L (ref 15–41)
Albumin: 2.9 g/dL — ABNORMAL LOW (ref 3.5–5.0)
Alkaline Phosphatase: 184 U/L — ABNORMAL HIGH (ref 38–126)
Anion gap: 8 (ref 5–15)
BILIRUBIN TOTAL: 0.7 mg/dL (ref 0.3–1.2)
CO2: 21 mmol/L — ABNORMAL LOW (ref 22–32)
CREATININE: 0.41 mg/dL — AB (ref 0.44–1.00)
Calcium: 8.6 mg/dL — ABNORMAL LOW (ref 8.9–10.3)
Chloride: 105 mmol/L (ref 101–111)
Glucose, Bld: 87 mg/dL (ref 65–99)
POTASSIUM: 3.3 mmol/L — AB (ref 3.5–5.1)
Sodium: 134 mmol/L — ABNORMAL LOW (ref 135–145)
TOTAL PROTEIN: 6.3 g/dL — AB (ref 6.5–8.1)

## 2016-09-18 LAB — URINALYSIS, ROUTINE W REFLEX MICROSCOPIC
BILIRUBIN URINE: NEGATIVE
GLUCOSE, UA: NEGATIVE mg/dL
HGB URINE DIPSTICK: NEGATIVE
KETONES UR: NEGATIVE mg/dL
Leukocytes, UA: NEGATIVE
NITRITE: NEGATIVE
PH: 7 (ref 5.0–8.0)
Protein, ur: NEGATIVE mg/dL
SPECIFIC GRAVITY, URINE: 1.006 (ref 1.005–1.030)

## 2016-09-18 LAB — AMYLASE: Amylase: 58 U/L (ref 28–100)

## 2016-09-18 LAB — LIPASE, BLOOD: LIPASE: 19 U/L (ref 11–51)

## 2016-09-18 MED ORDER — SODIUM CHLORIDE 0.9 % IV SOLN
510.0000 mg | Freq: Once | INTRAVENOUS | Status: AC
  Administered 2016-09-18: 510 mg via INTRAVENOUS
  Filled 2016-09-18: qty 17

## 2016-09-18 MED ORDER — SODIUM CHLORIDE 0.9 % IV SOLN
INTRAVENOUS | Status: DC
  Administered 2016-09-18: 20:00:00 via INTRAVENOUS

## 2016-09-18 MED ORDER — FAMOTIDINE 20 MG PO TABS: 20.0000 mg | ORAL_TABLET | Freq: Every day | ORAL | 1 refills | Status: DC

## 2016-09-18 NOTE — MAU Note (Signed)
Not having pain at present; pt has pain after she eats and she rates the pain at 7; describes pain as a sharp stabbing pain in her R ribs and in her back between her shoulder blades

## 2016-09-18 NOTE — Progress Notes (Signed)
Notified of Critical lab value from lab, hemoglobin is 6.8. Notified Jorje Guild NP. No new orders received at this time.

## 2016-09-18 NOTE — MAU Provider Note (Signed)
History     CSN: [id number]  Arrival date and time: 09/18/16 [id number]   First Provider Initiated Contact with Patient 09/18/16 1858       Chief Complaint  Patient presents with  . Abdominal Pain  . Emesis During Pregnancy   HPI  Eileen [name] is a 26 y.o. [phone] at [redacted]w[redacted]d who presents with abdominal pain. First noticed symptoms 3 years ago. Symptoms have been more consistent for the last 2 weeks. Describes RUQ pain that radiates to inferior right shoulder blade. Pain occurs while eating and at times wakes her in the middle of the night. Patient vomits after eating. Endorses heartburn as well that she has been treating with tums. Vomited 3 times today. Ate oatmeal this morning without symptoms. Ate a ritz cracker around 4 pm; was able to keep down but did have some pain. Loose stools x 2 days; light brown/yellow in color; 3 episodes per day.  Denies fever/chills, diarrhea, lower abdominal pain, vaginal bleeding, or LOF. Positive fetal movement.  Denies symptoms at this time.   OB History    Gravida Para Term Preterm AB Living   6 3 3  0 2 3   SAB TAB Ectopic Multiple Live Births     2 0 0 3      Past Medical History:  Diagnosis Date  . Abortion 08/04/2011  . Anemia   . Anxiety   . Chronic kidney disease    kidney infection.  . Depression   . Dysuria   . Fibromyalgia   . Headache(784.0)   . Hypothyroid    history of.  . Mitral valve prolapse   . RA (rheumatoid arthritis) ([place])     Past Surgical History:  Procedure Laterality Date  . DILATION AND CURETTAGE OF UTERUS    . LAPAROSCOPY N/A 05/25/2014   Procedure: LAPAROSCOPY DIAGNOSTIC;  Surgeon: Osborne Oman, MD;  Location: [facility];  Service: Gynecology;  Laterality: N/A;  . THERAPEUTIC ABORTION N/A 2012    Family History  Problem Relation Age of Onset  . Cancer Mother     SKIN  . Hyperlipidemia Mother   . Thyroid disease Mother   . Heart disease Mother   . Mitral valve prolapse Mother   . Heart attack Mother   .  Asthma Brother   . Diabetes Maternal Grandmother   . Hyperlipidemia Maternal Grandmother   . Emphysema Paternal Grandfather     Social History  Substance Use Topics  . Smoking status: Current Every Day Smoker    Packs/day: 0.50    Types: Cigarettes  . Smokeless tobacco: Current User  . Alcohol use 0.0 oz/week     Comment: occasional but not while pregnant    Allergies:  Allergies  Allergen Reactions  . Eggs Or Egg-Derived Products Nausea Only  . Other Hives and Nausea And Vomiting    Green beans  . Red Dye Nausea And Vomiting  . Sulfa Antibiotics Hives and Nausea And Vomiting    Childhood reaction    Prescriptions Prior to Admission  Medication Sig Dispense Refill Last Dose  . acetaminophen (TYLENOL) 500 MG tablet Take 500 mg by mouth daily as needed for mild pain or headache.    Past Week at Unknown time  . amitriptyline (ELAVIL) 10 MG tablet Take 1 tablet (10 mg total) by mouth at bedtime. 30 tablet 2 09/15/2016 at Unknown time  . cyclobenzaprine (FLEXERIL) 10 MG tablet Take 1 tablet (10 mg total) by mouth 2 (two) times daily as needed for muscle  spasms. (Patient not taking: Reported on 08/04/2016) 20 tablet 0 Not Taking  . ferrous sulfate (FERROUSUL) 325 (65 FE) MG tablet Take 1 tablet (325 mg total) by mouth 2 (two) times daily. (Patient not taking: Reported on 08/25/2016) 60 tablet 1 Not Taking  . Prenatal MV-Min-FA-Omega-3 (PRENATAL GUMMIES/DHA & FA) 0.4-32.5 MG CHEW Chew 2 tablets by mouth daily.    09/15/2016 at Unknown time    Review of Systems  Constitutional: Negative for chills and fever.  Respiratory: Negative for shortness of breath.   Cardiovascular: Positive for palpitations. Negative for chest pain.  Gastrointestinal: Positive for abdominal pain, nausea and vomiting. Negative for constipation and diarrhea.  Genitourinary: Negative.   Neurological: Positive for light-headedness.   Physical Exam   Blood pressure 116/74, pulse (!) 125, temperature 98.8 F (37.1  C), temperature source Oral, resp. rate 16, weight 135 lb (61.2 kg), last menstrual period 02/05/2016, unknown if currently breastfeeding.  Physical Exam  Nursing note and vitals reviewed. Constitutional: She is oriented to person, place, and time. She appears well-developed and well-nourished. No distress.  HENT:  Head: Normocephalic and atraumatic.  Eyes: Conjunctivae are normal. Right eye exhibits no discharge. Left eye exhibits no discharge. No scleral icterus.  Neck: Normal range of motion.  Cardiovascular: Normal rate, regular rhythm and normal heart sounds.   No murmur heard. Respiratory: Effort normal and breath sounds normal. No respiratory distress. She has no wheezes.  GI: Soft. Bowel sounds are normal. There is no tenderness. There is negative Murphy's sign.  Neurological: She is alert and oriented to person, place, and time.  Skin: Skin is warm and dry. She is not diaphoretic.  Psychiatric: She has a normal mood and affect. Her behavior is normal. Judgment and thought content normal.   Fetal Tracing:  Baseline: 130 Variability: moderate Accelerations: 15x15 Decelerations: none  Toco: UI MAU Course  Procedures Results for orders placed or performed during the hospital encounter of 09/18/16 (from the past 24 hour(s))  Urinalysis, Routine w reflex microscopic     Status: None   Collection Time: 09/18/16  6:20 PM  Result Value Ref Range   Color, Urine YELLOW YELLOW   APPearance CLEAR CLEAR   Specific Gravity, Urine 1.006 1.005 - 1.030   pH 7.0 5.0 - 8.0   Glucose, UA NEGATIVE NEGATIVE mg/dL   Hgb urine dipstick NEGATIVE NEGATIVE   Bilirubin Urine NEGATIVE NEGATIVE   Ketones, ur NEGATIVE NEGATIVE mg/dL   Protein, ur NEGATIVE NEGATIVE mg/dL   Nitrite NEGATIVE NEGATIVE   Leukocytes, UA NEGATIVE NEGATIVE  CBC     Status: Abnormal   Collection Time: 09/18/16  7:02 PM  Result Value Ref Range   WBC 12.0 (H) 4.0 - 10.5 K/uL   RBC 3.30 (L) 3.87 - 5.11 MIL/uL    Hemoglobin 6.8 (LL) 12.0 - 15.0 g/dL   HCT 22.5 (L) 36.0 - 46.0 %   MCV 68.2 (L) 78.0 - 100.0 fL   MCH 20.6 (L) 26.0 - 34.0 pg   MCHC 30.2 30.0 - 36.0 g/dL   RDW 17.1 (H) 11.5 - 15.5 %   Platelets 199 150 - 400 K/uL  Amylase     Status: None   Collection Time: 09/18/16  7:02 PM  Result Value Ref Range   Amylase 58 28 - 100 U/L  Comprehensive metabolic panel     Status: Abnormal   Collection Time: 09/18/16  7:02 PM  Result Value Ref Range   Sodium 134 (L) 135 - 145 mmol/L   Potassium  3.3 (L) 3.5 - 5.1 mmol/L   Chloride 105 101 - 111 mmol/L   CO2 21 (L) 22 - 32 mmol/L   Glucose, Bld 87 65 - 99 mg/dL   BUN <5 (L) 6 - 20 mg/dL   Creatinine, Ser 0.41 (L) 0.44 - 1.00 mg/dL   Calcium 8.6 (L) 8.9 - 10.3 mg/dL   Total Protein 6.3 (L) 6.5 - 8.1 g/dL   Albumin 2.9 (L) 3.5 - 5.0 g/dL   AST 19 15 - 41 U/L   ALT 8 (L) 14 - 54 U/L   Alkaline Phosphatase 184 (H) 38 - 126 U/L   Total Bilirubin 0.7 0.3 - 1.2 mg/dL   GFR calc non Af Amer >60 >60 mL/min   GFR calc Af Amer >60 >60 mL/min   Anion gap 8 5 - 15  Lipase, blood     Status: None   Collection Time: 09/18/16  7:02 PM  Result Value Ref Range   Lipase 19 11 - 51 U/L    MDM Category 1 tracing CBC, CMP, amylase, lipase Unable to do gall bladder scan today since recently ate/drank. Patient stable & labs WNL -- will order outpatient gall bladder scan CBC -- hemoglobin 6.8 -- will give feraheme in MAU  Care turned over to [place], NP 09/18/2016 8:22 PM   Assessment and Plan  [redacted] weeks gestation Abdominal pain Nausea and vomiting  Discharge home [facility] 2 weeks as scheduled Follow up for RUQ [place] next week Return for worsening sx Declines antiemetics Rx Pepcid  Allergies as of 09/18/2016      Reactions   Eggs Or Egg-derived Products Nausea Only   Other Hives, Nausea And Vomiting   Green beans   Red Dye Nausea And Vomiting   Sulfa Antibiotics Hives, Nausea And Vomiting   Childhood  reaction      Medication List    TAKE these medications   acetaminophen 500 MG tablet Commonly known as:  TYLENOL Take 500 mg by mouth daily as needed for mild pain or headache.   amitriptyline 10 MG tablet Commonly known as:  ELAVIL Take 1 tablet (10 mg total) by mouth at bedtime.   cyclobenzaprine 10 MG tablet Commonly known as:  FLEXERIL Take 1 tablet (10 mg total) by mouth 2 (two) times daily as needed for muscle spasms.   famotidine 20 MG tablet Commonly known as:  PEPCID Take 1 tablet (20 mg total) by mouth daily.   ferrous sulfate 325 (65 FE) MG tablet Commonly known as:  FERROUSUL Take 1 tablet (325 mg total) by mouth 2 (two) times daily.   PRENATAL GUMMIES/DHA & FA 0.4-32.5 MG Chew Chew 2 tablets by mouth daily.      Julianne Handler, CNM  09/18/2016 9:42 PM

## 2016-09-18 NOTE — Discharge Instructions (Signed)
Abdominal Pain During Pregnancy °Belly (abdominal) pain is common during pregnancy. Most of the time, it is not a serious problem. Other times, it can be a sign that something is wrong with the pregnancy. Always tell your doctor if you have belly pain. °Follow these instructions at home: °Monitor your belly pain for any changes. The following actions may help you feel better: °· Do not have sex (intercourse) or put anything in your vagina until you feel better. °· Rest until your pain stops. °· Drink clear fluids if you feel sick to your stomach (nauseous). Do not eat solid food until you feel better. °· Only take medicine as told by your doctor. °· Keep all doctor visits as told. °Get help right away if: °· You are bleeding, leaking fluid, or pieces of tissue come out of your vagina. °· You have more pain or cramping. °· You keep throwing up (vomiting). °· You have pain when you pee (urinate) or have blood in your pee. °· You have a fever. °· You do not feel your baby moving as much. °· You feel very weak or feel like passing out. °· You have trouble breathing, with or without belly pain. °· You have a very bad headache and belly pain. °· You have fluid leaking from your vagina and belly pain. °· You keep having watery poop (diarrhea). °· Your belly pain does not go away after resting, or the pain gets worse. °This information is not intended to replace advice given to you by your health care provider. Make sure you discuss any questions you have with your health care provider. °Document Released: 07/01/2009 Document Revised: 02/19/2016 Document Reviewed: 02/09/2013 °Elsevier Interactive Patient Education © 2017 [employer] ° °

## 2016-09-18 NOTE — MAU Note (Signed)
Keeps having pains in her rt shoulder and rt ribs.  Can't eat, starts hurts and later gets sick.  Not having any pain right now. Loose stools

## 2016-09-21 ENCOUNTER — Telehealth: Payer: Self-pay

## 2016-09-21 NOTE — Telephone Encounter (Addendum)
Returned call, patient stated that she got everything worked out.

## 2016-09-22 ENCOUNTER — Encounter: Payer: Medicaid Other | Admitting: Certified Nurse Midwife

## 2016-09-28 ENCOUNTER — Ambulatory Visit (INDEPENDENT_AMBULATORY_CARE_PROVIDER_SITE_OTHER): Payer: Medicaid Other | Admitting: Obstetrics & Gynecology

## 2016-09-28 VITALS — BP 115/79 | HR 120 | Wt 134.0 lb

## 2016-09-28 DIAGNOSIS — D649 Anemia, unspecified: Secondary | ICD-10-CM

## 2016-09-28 DIAGNOSIS — Z3480 Encounter for supervision of other normal pregnancy, unspecified trimester: Secondary | ICD-10-CM

## 2016-09-28 DIAGNOSIS — L299 Pruritus, unspecified: Secondary | ICD-10-CM

## 2016-09-28 DIAGNOSIS — O99013 Anemia complicating pregnancy, third trimester: Secondary | ICD-10-CM

## 2016-09-28 NOTE — Progress Notes (Signed)
   PRENATAL VISIT NOTE  Subjective:  Eileen [name] is a 26 y.o. DW [id number] at [redacted]w[redacted]d being seen today for ongoing prenatal care.  She is currently monitored for the following issues for this low-risk pregnancy and has Goiter, unspecified; Anxiety; Depression with anxiety; Rheumatoid arthritis ([place]); Tobacco abuse; Supervision of normal pregnancy, antepartum; Tachycardia; Anemia affecting pregnancy; and Back pain affecting pregnancy on her problem list.  Patient reports itching all over, started this morning, feels like the itching she had with last pregnancy, cholestasis .Marland Kitchen  Contractions: Irregular. Vag. Bleeding: None.  Movement: Present. Denies leaking of fluid.   The following portions of the patient's history were reviewed and updated as appropriate: allergies, current medications, past family history, past medical history, past social history, past surgical history and problem list. Problem list updated.  Objective:   Vitals:   09/28/16 1001  BP: 115/79  Pulse: (!) 120  Weight: 60.8 kg (134 lb)    Fetal Status: Fetal Heart Rate (bpm): 130   Movement: Present     General:  Alert, oriented and cooperative. Patient is in no acute distress.  Skin: Skin is warm and dry. No rash noted.   Cardiovascular: Normal heart rate noted  Respiratory: Normal respiratory effort, no problems with respiration noted  Abdomen: Soft, gravid, appropriate for gestational age. Pain/Pressure: Present     Pelvic:  Cervical exam deferred        Extremities: Normal range of motion.  Edema: Trace  Mental Status: Normal mood and affect. Normal behavior. Normal judgment and thought content.   Assessment and Plan:  Pregnancy: [id number] at [redacted]w[redacted]d  1. Anemia affecting pregnancy in third trimester -had an iron transfusion but vomits when taking iron pills - rec iron rich foods  2. Supervision of other normal pregnancy, antepartum 3. H/o cholestasis, now itching- check bile acids   Preterm labor symptoms and  general obstetric precautions including but not limited to vaginal bleeding, contractions, leaking of fluid and fetal movement were reviewed in detail with the patient. Please refer to After Visit Summary for other counseling recommendations.  Return in about 2 weeks (around 10/12/2016).   Emily Filbert, MD

## 2016-09-28 NOTE — Patient Instructions (Signed)
Iron-Rich Diet Iron is a mineral that helps your body to produce hemoglobin. Hemoglobin is a protein in your red blood cells that carries oxygen to your body's tissues. Eating too little iron may cause you to feel weak and tired, and it can increase your risk for infection. Eating enough iron is necessary for your body's metabolism, muscle function, and nervous system. Iron is naturally found in many foods. It can also be added to foods or fortified in foods. There are two types of dietary iron:  Heme iron. Heme iron is absorbed by the body more easily than nonheme iron. Heme iron is found in meat, poultry, and fish.  Nonheme iron. Nonheme iron is found in dietary supplements, iron-fortified grains, beans, and vegetables. You may need to follow an iron-rich diet if:  You have been diagnosed with iron deficiency or iron-deficiency anemia.  You have a condition that prevents you from absorbing dietary iron, such as:  Infection in your intestines.  Celiac disease. This involves long-lasting (chronic) inflammation of your intestines.  You do not eat enough iron.  You eat a diet that is high in foods that impair iron absorption.  You have lost a lot of blood.  You have heavy bleeding during your menstrual cycle.  You are pregnant. What is my plan? Your health care provider may help you to determine how much iron you need per day based on your condition. Generally, when a person consumes sufficient amounts of iron in the diet, the following iron needs are met:  Men.  24-56 years old: 11 mg per day.  59-71 years old: 8 mg per day.  Women.  46-49 years old: 15 mg per day.  61-31 years old: 18 mg per day.  Over 8 years old: 8 mg per day.  Pregnant women: 27 mg per day.  Breastfeeding women: 9 mg per day. What do I need to know about an iron-rich diet?  Eat fresh fruits and vegetables that are high in vitamin C along with foods that are high in iron. This will help increase the  amount of iron that your body absorbs from food, especially with foods containing nonheme iron. Foods that are high in vitamin C include oranges, peppers, tomatoes, and mango.  Take iron supplements only as directed by your health care provider. Overdose of iron can be life-threatening. If you were prescribed iron supplements, take them with orange juice or a vitamin C supplement.  Cook foods in pots and pans that are made from iron.  Eat nonheme iron-containing foods alongside foods that are high in heme iron. This helps to improve your iron absorption.  Certain foods and drinks contain compounds that impair iron absorption. Avoid eating these foods in the same meal as iron-rich foods or with iron supplements. These include:  Coffee, black tea, and red wine.  Milk, dairy products, and foods that are high in calcium.  Beans, soybeans, and peas.  Whole grains.  When eating foods that contain both nonheme iron and compounds that impair iron absorption, follow these tips to absorb iron better.  Soak beans overnight before cooking.  Soak whole grains overnight and drain them before using.  Ferment flours before baking, such as using yeast in bread dough. What foods can I eat? Grains  Iron-fortified breakfast cereal. Iron-fortified whole-wheat bread. Enriched rice. Sprouted grains. Vegetables  Spinach. Potatoes with skin. Green peas. Broccoli. Red and green bell peppers. Fermented vegetables. Fruits  Prunes. Raisins. Oranges. Strawberries. Mango. Grapefruit. Meats and Other Protein Sources  Sources  °Beef liver. Oysters. Beef. Shrimp. Turkey. Chicken. Tuna. Sardines. Chickpeas. Nuts. Tofu. °Beverages  °Tomato juice. Fresh orange juice. Prune juice. Hibiscus tea. Fortified instant breakfast shakes. °Condiments  °Tahini. Fermented soy sauce. °Sweets and Desserts  °Black-strap molasses. °Other  °Wheat germ. °The items listed above may not be a complete list of recommended foods or beverages.  Contact your dietitian for more options.  °What foods are not recommended? °Grains  °Whole grains. Bran cereal. Bran flour. Oats. °Vegetables  °Artichokes. Brussels sprouts. Kale. °Fruits  °Blueberries. Raspberries. Strawberries. Figs. °Meats and Other Protein Sources  °Soybeans. Products made from soy protein. °Dairy  °Milk. Cream. Cheese. Yogurt. Cottage cheese. °Beverages  °Coffee. Black tea. Red wine. °Sweets and Desserts  °Cocoa. Chocolate. Ice cream. °Other  °Basil. Oregano. Parsley. °The items listed above may not be a complete list of foods and beverages to avoid. Contact your dietitian for more information.  °This information is not intended to replace advice given to you by your health care provider. Make sure you discuss any questions you have with your health care provider. °Document Released: 02/24/2005 Document Revised: 01/31/2016 Document Reviewed: 02/07/2014 °Elsevier Interactive Patient Education © 2017 [employer] Inc[employer] ° ° ° °

## 2016-09-28 NOTE — Addendum Note (Signed)
Addended by: Lewie Loron D on: 09/28/2016 10:29 AM   Modules accepted: Orders

## 2016-09-28 NOTE — Progress Notes (Signed)
Pt states that she is having severe itching, esp feet and hands. Pt states she has had Cholestasis with previous pregnancy. Pt states some ctx. Pt some leg cramps that wake her up at night.

## 2016-09-30 ENCOUNTER — Other Ambulatory Visit: Payer: Self-pay | Admitting: Obstetrics & Gynecology

## 2016-09-30 ENCOUNTER — Telehealth: Payer: Self-pay

## 2016-09-30 DIAGNOSIS — O26619 Liver and biliary tract disorders in pregnancy, unspecified trimester: Principal | ICD-10-CM

## 2016-09-30 DIAGNOSIS — O26649 Intrahepatic cholestasis of pregnancy, unspecified trimester: Secondary | ICD-10-CM | POA: Insufficient documentation

## 2016-09-30 DIAGNOSIS — K831 Obstruction of bile duct: Secondary | ICD-10-CM | POA: Insufficient documentation

## 2016-09-30 LAB — CBC
HEMATOCRIT: 27.1 % — AB (ref 34.0–46.6)
HEMOGLOBIN: 8.5 g/dL — AB (ref 11.1–15.9)
MCH: 22.7 pg — AB (ref 26.6–33.0)
MCHC: 31.4 g/dL — AB (ref 31.5–35.7)
MCV: 73 fL — ABNORMAL LOW (ref 79–97)
Platelets: 178 10*3/uL (ref 150–379)
RBC: 3.74 x10E6/uL — ABNORMAL LOW (ref 3.77–5.28)
RDW: 26.2 % — ABNORMAL HIGH (ref 12.3–15.4)
WBC: 9.4 10*3/uL (ref 3.4–10.8)

## 2016-09-30 LAB — BILE ACIDS, TOTAL: Bile Acids Total: 10.3 umol/L (ref 4.7–24.5)

## 2016-09-30 MED ORDER — URSODIOL 500 MG PO TABS: 500.0000 mg | ORAL_TABLET | Freq: Two times a day (BID) | ORAL | 3 refills | Status: DC

## 2016-09-30 NOTE — Telephone Encounter (Signed)
Patient called back and stated that she was told that she would be induced for cholestasis and that she had it with her last pregnancy and knows all of the symptoms. Patient requested that provider review info and follow up. Routed to provider for review.

## 2016-09-30 NOTE — Telephone Encounter (Signed)
Patient called in for lab results  ?

## 2016-09-30 NOTE — Telephone Encounter (Signed)
Contacted patient and advised that provider sent over rx and that she is to have a NST twice a week starting with one this week, and to follow up with provider visit on Monday. Advised that scheduler is out of the office and someone will contact you with the appt times, patient agreed.

## 2016-10-01 ENCOUNTER — Ambulatory Visit (HOSPITAL_COMMUNITY)
Admission: RE | Admit: 2016-10-01 | Discharge: 2016-10-01 | Disposition: A | Discharge status: Home / Self Care | Payer: Medicaid Other | Source: Ambulatory Visit | Attending: Obstetrics & Gynecology | Admitting: Obstetrics & Gynecology

## 2016-10-01 ENCOUNTER — Encounter (HOSPITAL_COMMUNITY): Payer: Self-pay

## 2016-10-01 ENCOUNTER — Other Ambulatory Visit (HOSPITAL_COMMUNITY): Payer: Self-pay | Admitting: *Deleted

## 2016-10-01 VITALS — BP 118/71 | HR 103 | Wt 136.0 lb

## 2016-10-01 DIAGNOSIS — O26613 Liver and biliary tract disorders in pregnancy, third trimester: Secondary | ICD-10-CM | POA: Diagnosis present

## 2016-10-01 DIAGNOSIS — K831 Obstruction of bile duct: Secondary | ICD-10-CM

## 2016-10-01 DIAGNOSIS — Z3A34 34 weeks gestation of pregnancy: Secondary | ICD-10-CM | POA: Diagnosis not present

## 2016-10-01 DIAGNOSIS — O26619 Liver and biliary tract disorders in pregnancy, unspecified trimester: Secondary | ICD-10-CM

## 2016-10-01 DIAGNOSIS — R6250 Unspecified lack of expected normal physiological development in childhood: Secondary | ICD-10-CM

## 2016-10-01 NOTE — Procedures (Signed)
Eileen [name] Nov 27, 1990 [redacted]w[redacted]d  Fetus A Non-Stress Test Interpretation for 10/01/16  Indication: Cholestasis  Fetal Heart Rate A Mode: External Baseline Rate (A): 120 bpm Variability: Moderate Accelerations: 15 x 15 Decelerations: None Multiple birth?: No  Uterine Activity Mode: Toco Contraction Frequency (min): irreg UC noted. Contraction Duration (sec): 60-180 Contraction Quality: Mild Resting Tone Palpated: Relaxed Resting Time: Adequate  Interpretation (Fetal Testing) Nonstress Test Interpretation: Reactive Overall Impression: Reassuring for gestational age Comments: FHR tracing rev'd by Dr. Lisbeth Renshaw.

## 2016-10-05 ENCOUNTER — Encounter: Payer: Medicaid Other | Admitting: Obstetrics and Gynecology

## 2016-10-06 ENCOUNTER — Ambulatory Visit (INDEPENDENT_AMBULATORY_CARE_PROVIDER_SITE_OTHER): Payer: Medicaid Other | Admitting: Obstetrics and Gynecology

## 2016-10-06 VITALS — BP 111/74 | HR 110 | Wt 136.2 lb

## 2016-10-06 DIAGNOSIS — K831 Obstruction of bile duct: Secondary | ICD-10-CM

## 2016-10-06 DIAGNOSIS — O26613 Liver and biliary tract disorders in pregnancy, third trimester: Secondary | ICD-10-CM

## 2016-10-06 DIAGNOSIS — Z3480 Encounter for supervision of other normal pregnancy, unspecified trimester: Secondary | ICD-10-CM

## 2016-10-06 DIAGNOSIS — O26619 Liver and biliary tract disorders in pregnancy, unspecified trimester: Principal | ICD-10-CM

## 2016-10-06 MED ORDER — PROMETHAZINE HCL 25 MG PO TABS: 25.0000 mg | ORAL_TABLET | Freq: Four times a day (QID) | ORAL | 1 refills | Status: DC | PRN

## 2016-10-06 NOTE — Progress Notes (Signed)
   PRENATAL VISIT NOTE  Subjective:  Eileen [name] is a 26 y.o. [phone] at [redacted]w[redacted]d being seen today for ongoing prenatal care.  She is currently monitored for the following issues for this high-risk pregnancy and has Goiter, unspecified; Anxiety; Depression with anxiety; Rheumatoid arthritis ([place]); Tobacco abuse; Supervision of normal pregnancy, antepartum; Tachycardia; Anemia affecting pregnancy; Back pain affecting pregnancy; and Cholestasis during pregnancy, antepartum on her problem list.  Patient reports persistent pruritis. She has not been taking the Actigall because she doesn't like taking medications.  Contractions: Irregular. Vag. Bleeding: None.  Movement: Absent. Denies leaking of fluid.   The following portions of the patient's history were reviewed and updated as appropriate: allergies, current medications, past family history, past medical history, past social history, past surgical history and problem list. Problem list updated.  Objective:   Vitals:   10/06/16 1023  BP: 111/74  Pulse: (!) 110  Weight: 136 lb 3.2 oz (61.8 kg)    Fetal Status: Fetal Heart Rate (bpm): NST Fundal Height: 34 cm Movement: Absent     General:  Alert, oriented and cooperative. Patient is in no acute distress.  Skin: Skin is warm and dry. No rash noted.   Cardiovascular: Normal heart rate noted  Respiratory: Normal respiratory effort, no problems with respiration noted  Abdomen: Soft, gravid, appropriate for gestational age. Pain/Pressure: Absent     Pelvic:  Cervical exam deferred        Extremities: Normal range of motion.  Edema: Trace  Mental Status: Normal mood and affect. Normal behavior. Normal judgment and thought content.   Assessment and Plan:  Pregnancy: [id number] at [redacted]w[redacted]d  1. Cholestasis during pregnancy, antepartum Informed patient to take Actigall  Benadryl will help with pruritis Rx phenergan provided NST reviewed and reactive with a baseline 130, mod variability, + accels, no  decels  2. Supervision of other normal pregnancy, antepartum Follow up growth with NST on 3/15 Patient desires cultures to be done at her next visit Will plan for induction of labor at 37 weeks  Preterm labor symptoms and general obstetric precautions including but not limited to vaginal bleeding, contractions, leaking of fluid and fetal movement were reviewed in detail with the patient. Please refer to After Visit Summary for other counseling recommendations.  No Follow-up on file.   Mora Bellman, MD

## 2016-10-08 ENCOUNTER — Other Ambulatory Visit (HOSPITAL_COMMUNITY): Payer: Self-pay | Admitting: *Deleted

## 2016-10-08 ENCOUNTER — Ambulatory Visit (HOSPITAL_COMMUNITY)
Admission: RE | Admit: 2016-10-08 | Discharge: 2016-10-08 | Disposition: A | Discharge status: Home / Self Care | Payer: Medicaid Other | Source: Ambulatory Visit | Attending: Obstetrics & Gynecology | Admitting: Obstetrics & Gynecology

## 2016-10-08 ENCOUNTER — Encounter (HOSPITAL_COMMUNITY): Payer: Self-pay

## 2016-10-08 ENCOUNTER — Other Ambulatory Visit (HOSPITAL_COMMUNITY): Payer: Self-pay | Admitting: Maternal and Fetal Medicine

## 2016-10-08 VITALS — BP 118/79 | HR 115 | Wt 136.8 lb

## 2016-10-08 DIAGNOSIS — O99280 Endocrine, nutritional and metabolic diseases complicating pregnancy, unspecified trimester: Secondary | ICD-10-CM

## 2016-10-08 DIAGNOSIS — O26619 Liver and biliary tract disorders in pregnancy, unspecified trimester: Principal | ICD-10-CM

## 2016-10-08 DIAGNOSIS — K831 Obstruction of bile duct: Secondary | ICD-10-CM | POA: Diagnosis not present

## 2016-10-08 DIAGNOSIS — O2693 Pregnancy related conditions, unspecified, third trimester: Secondary | ICD-10-CM

## 2016-10-08 DIAGNOSIS — O99333 Smoking (tobacco) complicating pregnancy, third trimester: Secondary | ICD-10-CM | POA: Diagnosis not present

## 2016-10-08 DIAGNOSIS — E079 Disorder of thyroid, unspecified: Secondary | ICD-10-CM | POA: Insufficient documentation

## 2016-10-08 DIAGNOSIS — O99283 Endocrine, nutritional and metabolic diseases complicating pregnancy, third trimester: Secondary | ICD-10-CM | POA: Diagnosis not present

## 2016-10-08 DIAGNOSIS — Z3A35 35 weeks gestation of pregnancy: Secondary | ICD-10-CM | POA: Insufficient documentation

## 2016-10-08 DIAGNOSIS — O99340 Other mental disorders complicating pregnancy, unspecified trimester: Secondary | ICD-10-CM

## 2016-10-08 DIAGNOSIS — O26613 Liver and biliary tract disorders in pregnancy, third trimester: Secondary | ICD-10-CM | POA: Insufficient documentation

## 2016-10-08 DIAGNOSIS — O99343 Other mental disorders complicating pregnancy, third trimester: Secondary | ICD-10-CM | POA: Diagnosis not present

## 2016-10-08 DIAGNOSIS — R6250 Unspecified lack of expected normal physiological development in childhood: Secondary | ICD-10-CM

## 2016-10-08 HISTORY — DX: Intrahepatic cholestasis of pregnancy, unspecified trimester: O26.649

## 2016-10-08 HISTORY — DX: Obstruction of bile duct: K83.1

## 2016-10-08 HISTORY — DX: Liver and biliary tract disorders in pregnancy, unspecified trimester: O26.619

## 2016-10-08 NOTE — Procedures (Signed)
Eileen [name] 02-01-1991 [redacted]w[redacted]d  Fetus A Non-Stress Test Interpretation for 10/08/16  Indication: cholestasis  Fetal Heart Rate A Mode: External Baseline Rate (A): 140 bpm Variability: Moderate Accelerations: 15 x 15 Decelerations: None  Uterine Activity Mode: Toco Contraction Frequency (min): occ UC noted Contraction Duration (sec): 60-80 Contraction Quality: Mild Resting Tone Palpated: Relaxed Resting Time: Adequate  Interpretation (Fetal Testing) Nonstress Test Interpretation: Reactive Comments: FHR tracing rev'd by Dr. Lisbeth Renshaw

## 2016-10-12 ENCOUNTER — Other Ambulatory Visit (HOSPITAL_COMMUNITY)
Admission: RE | Admit: 2016-10-12 | Discharge: 2016-10-12 | Disposition: A | Discharge status: Home / Self Care | Payer: Medicaid Other | Source: Ambulatory Visit | Attending: Obstetrics and Gynecology | Admitting: Obstetrics and Gynecology

## 2016-10-12 ENCOUNTER — Ambulatory Visit (INDEPENDENT_AMBULATORY_CARE_PROVIDER_SITE_OTHER): Payer: Medicaid Other | Admitting: Obstetrics and Gynecology

## 2016-10-12 VITALS — BP 112/76 | HR 120 | Wt 136.0 lb

## 2016-10-12 DIAGNOSIS — Z113 Encounter for screening for infections with a predominantly sexual mode of transmission: Secondary | ICD-10-CM | POA: Diagnosis not present

## 2016-10-12 DIAGNOSIS — K831 Obstruction of bile duct: Secondary | ICD-10-CM | POA: Diagnosis not present

## 2016-10-12 DIAGNOSIS — O26613 Liver and biliary tract disorders in pregnancy, third trimester: Secondary | ICD-10-CM

## 2016-10-12 DIAGNOSIS — Z3480 Encounter for supervision of other normal pregnancy, unspecified trimester: Secondary | ICD-10-CM

## 2016-10-12 DIAGNOSIS — Z3483 Encounter for supervision of other normal pregnancy, third trimester: Secondary | ICD-10-CM

## 2016-10-12 DIAGNOSIS — Z3689 Encounter for other specified antenatal screening: Secondary | ICD-10-CM

## 2016-10-12 DIAGNOSIS — O26619 Liver and biliary tract disorders in pregnancy, unspecified trimester: Secondary | ICD-10-CM

## 2016-10-12 LAB — OB RESULTS CONSOLE GBS: STREP GROUP B AG: NEGATIVE

## 2016-10-12 NOTE — Progress Notes (Signed)
Pt states that she feels dizzy this morning.

## 2016-10-12 NOTE — Progress Notes (Signed)
   PRENATAL VISIT NOTE  Subjective:  Eileen [name] is a 26 y.o. [phone] at [redacted]w[redacted]d being seen today for ongoing prenatal care.  She is currently monitored for the following issues for this high-risk pregnancy and has Goiter, unspecified; Anxiety; Depression with anxiety; Rheumatoid arthritis ([place]); Tobacco abuse; Supervision of normal pregnancy, antepartum; Tachycardia; Anemia affecting pregnancy; Back pain affecting pregnancy; and Cholestasis during pregnancy, antepartum on her problem list.  Patient reports persistent pruritis.  Contractions: Irregular. Vag. Bleeding: None.  Movement: Present. Denies leaking of fluid.   The following portions of the patient's history were reviewed and updated as appropriate: allergies, current medications, past family history, past medical history, past social history, past surgical history and problem list. Problem list updated.  Objective:   Vitals:   10/12/16 0955  BP: 112/76  Pulse: (!) 120  Weight: 136 lb (61.7 kg)    Fetal Status: Fetal Heart Rate (bpm): NST Fundal Height: 36 cm Movement: Present  Presentation: Vertex  General:  Alert, oriented and cooperative. Patient is in no acute distress.  Skin: Skin is warm and dry. No rash noted.   Cardiovascular: Normal heart rate noted  Respiratory: Normal respiratory effort, no problems with respiration noted  Abdomen: Soft, gravid, appropriate for gestational age. Pain/Pressure: Present     Pelvic:  Cervical exam performed Dilation: 1 Effacement (%): Thick Station: Ballotable  Extremities: Normal range of motion.     Mental Status: Normal mood and affect. Normal behavior. Normal judgment and thought content.   Assessment and Plan:  Pregnancy: [id number] at [redacted]w[redacted]d  1. Supervision of other normal pregnancy, antepartum Cultures today - GC/Chlamydia probe amp ([place])not at [facility] - Culture, beta strep (group b only)  2. Cholestasis during pregnancy, antepartum - Continue Actigall - Patient  scheduled for IOL at 37 weeks on 3/28 - NST reviewed and reactive with baseline 125, mod variability, +accels, no decels - Pt informed that the ultrasound is considered a limited OB ultrasound and is not intended to be a complete ultrasound exam.  Patient also informed that the ultrasound is not being completed with the intent of assessing for fetal or placental anomalies or any pelvic abnormalities.  Explained that the purpose of today's ultrasound is to assess for  AFI which was noted to be 9.7.  Patient acknowledges the purpose of the exam and the limitations of the study.      Preterm labor symptoms and general obstetric precautions including but not limited to vaginal bleeding, contractions, leaking of fluid and fetal movement were reviewed in detail with the patient. Please refer to After Visit Summary for other counseling recommendations.  Return in about 1 week (around 10/19/2016) for ROB, NST.   Mora Bellman, MD

## 2016-10-13 LAB — GC/CHLAMYDIA PROBE AMP (~~LOC~~) NOT AT ARMC
Chlamydia: NEGATIVE
Neisseria Gonorrhea: NEGATIVE

## 2016-10-14 ENCOUNTER — Other Ambulatory Visit (HOSPITAL_COMMUNITY): Payer: Medicaid Other

## 2016-10-14 ENCOUNTER — Ambulatory Visit: Payer: Medicaid Other | Admitting: Cardiovascular Disease

## 2016-10-14 ENCOUNTER — Other Ambulatory Visit: Payer: Self-pay | Admitting: Advanced Practice Midwife

## 2016-10-15 ENCOUNTER — Ambulatory Visit: Payer: Medicaid Other | Admitting: *Deleted

## 2016-10-15 DIAGNOSIS — K831 Obstruction of bile duct: Secondary | ICD-10-CM

## 2016-10-15 DIAGNOSIS — Z3480 Encounter for supervision of other normal pregnancy, unspecified trimester: Secondary | ICD-10-CM

## 2016-10-15 DIAGNOSIS — O26619 Liver and biliary tract disorders in pregnancy, unspecified trimester: Principal | ICD-10-CM

## 2016-10-15 NOTE — Progress Notes (Signed)
Pt is in office for NST.   NST in office today reactive per Dr Roselie Awkward.  Pt was removed from monitor and has appt scheduled next week.

## 2016-10-16 LAB — CULTURE, BETA STREP (GROUP B ONLY): Strep Gp B Culture: NEGATIVE

## 2016-10-19 ENCOUNTER — Ambulatory Visit (INDEPENDENT_AMBULATORY_CARE_PROVIDER_SITE_OTHER): Payer: Medicaid Other | Admitting: Obstetrics and Gynecology

## 2016-10-19 ENCOUNTER — Telehealth (HOSPITAL_COMMUNITY): Payer: Self-pay | Admitting: *Deleted

## 2016-10-19 VITALS — BP 108/76 | HR 114 | Wt 140.0 lb

## 2016-10-19 DIAGNOSIS — O26613 Liver and biliary tract disorders in pregnancy, third trimester: Secondary | ICD-10-CM | POA: Diagnosis not present

## 2016-10-19 DIAGNOSIS — O26619 Liver and biliary tract disorders in pregnancy, unspecified trimester: Principal | ICD-10-CM

## 2016-10-19 DIAGNOSIS — K831 Obstruction of bile duct: Secondary | ICD-10-CM | POA: Diagnosis not present

## 2016-10-19 DIAGNOSIS — Z3689 Encounter for other specified antenatal screening: Secondary | ICD-10-CM | POA: Diagnosis not present

## 2016-10-19 DIAGNOSIS — Z3480 Encounter for supervision of other normal pregnancy, unspecified trimester: Secondary | ICD-10-CM

## 2016-10-19 NOTE — Telephone Encounter (Signed)
Preadmission screen  

## 2016-10-19 NOTE — Progress Notes (Signed)
   PRENATAL VISIT NOTE  Subjective:  Eileen [name] is a 26 y.o. [phone] at [redacted]w[redacted]d being seen today for ongoing prenatal care.  She is currently monitored for the following issues for this high-risk pregnancy and has Goiter, unspecified; Anxiety; Depression with anxiety; Rheumatoid arthritis ([place]); Tobacco abuse; Supervision of normal pregnancy, antepartum; Tachycardia; Anemia affecting pregnancy; Back pain affecting pregnancy; and Cholestasis during pregnancy, antepartum on her problem list.  Patient reports right wrist pain not improving with wrist brace.  Contractions: Irregular. Vag. Bleeding: None.  Movement: Present. Denies leaking of fluid.   The following portions of the patient's history were reviewed and updated as appropriate: allergies, current medications, past family history, past medical history, past social history, past surgical history and problem list. Problem list updated.  Objective:   Vitals:   10/19/16 1006  BP: 108/76  Pulse: (!) 114  Weight: 140 lb (63.5 kg)    Fetal Status: Fetal Heart Rate (bpm): NST   Movement: Present     General:  Alert, oriented and cooperative. Patient is in no acute distress.  Skin: Skin is warm and dry. No rash noted.   Cardiovascular: Normal heart rate noted  Respiratory: Normal respiratory effort, no problems with respiration noted  Abdomen: Soft, gravid, appropriate for gestational age. Pain/Pressure: Present     Pelvic:  Cervical exam deferred        Extremities: Normal range of motion.     Mental Status: Normal mood and affect. Normal behavior. Normal judgment and thought content.   Assessment and Plan:  Pregnancy: [id number] at [redacted]w[redacted]d  1. Cholestasis during pregnancy, antepartum Continue Actigall NST reviewed and reactive with baseline 140, mod variability, + accels, no decels Pt informed that the ultrasound is considered a limited OB ultrasound and is not intended to be a complete ultrasound exam.  Patient also informed that the  ultrasound is not being completed with the intent of assessing for fetal or placental anomalies or any pelvic abnormalities.  Explained that the purpose of today's ultrasound is to assess for  AFI which was 8.4.  Patient acknowledges the purpose of the exam and the limitations of the study.    Patient scheduled for IOL on 3/28 at 37 weeks   2. Supervision of other normal pregnancy, antepartum Patient is doing well Will refer to Ortho if wrist pain persists pp  Term labor symptoms and general obstetric precautions including but not limited to vaginal bleeding, contractions, leaking of fluid and fetal movement were reviewed in detail with the patient. Please refer to After Visit Summary for other counseling recommendations.  No Follow-up on file.   Mora Bellman, MD

## 2016-10-19 NOTE — Progress Notes (Signed)
Pt complaints of right hand pain/numbness. Pt has tried wrist splints, with little relief.

## 2016-10-21 ENCOUNTER — Inpatient Hospital Stay (HOSPITAL_COMMUNITY)
Admission: RE | Admit: 2016-10-21 | Discharge: 2016-10-23 | DRG: 767 | Disposition: A | Discharge status: Home / Self Care | Payer: Medicaid Other | Source: Ambulatory Visit | Attending: Obstetrics & Gynecology | Admitting: Obstetrics & Gynecology

## 2016-10-21 ENCOUNTER — Inpatient Hospital Stay (HOSPITAL_COMMUNITY): Payer: Medicaid Other | Admitting: Anesthesiology

## 2016-10-21 ENCOUNTER — Encounter (HOSPITAL_COMMUNITY): Payer: Self-pay

## 2016-10-21 DIAGNOSIS — Z3A37 37 weeks gestation of pregnancy: Secondary | ICD-10-CM | POA: Diagnosis not present

## 2016-10-21 DIAGNOSIS — O99334 Smoking (tobacco) complicating childbirth: Secondary | ICD-10-CM | POA: Diagnosis present

## 2016-10-21 DIAGNOSIS — O9902 Anemia complicating childbirth: Secondary | ICD-10-CM | POA: Diagnosis present

## 2016-10-21 DIAGNOSIS — O26893 Other specified pregnancy related conditions, third trimester: Secondary | ICD-10-CM | POA: Diagnosis present

## 2016-10-21 DIAGNOSIS — D649 Anemia, unspecified: Secondary | ICD-10-CM | POA: Diagnosis present

## 2016-10-21 DIAGNOSIS — O2662 Liver and biliary tract disorders in childbirth: Secondary | ICD-10-CM | POA: Diagnosis present

## 2016-10-21 DIAGNOSIS — K831 Obstruction of bile duct: Secondary | ICD-10-CM | POA: Diagnosis present

## 2016-10-21 DIAGNOSIS — Z302 Encounter for sterilization: Secondary | ICD-10-CM

## 2016-10-21 DIAGNOSIS — F17210 Nicotine dependence, cigarettes, uncomplicated: Secondary | ICD-10-CM | POA: Diagnosis present

## 2016-10-21 DIAGNOSIS — O26613 Liver and biliary tract disorders in pregnancy, third trimester: Secondary | ICD-10-CM

## 2016-10-21 DIAGNOSIS — R000 Tachycardia, unspecified: Secondary | ICD-10-CM | POA: Diagnosis present

## 2016-10-21 DIAGNOSIS — O26643 Intrahepatic cholestasis of pregnancy, third trimester: Secondary | ICD-10-CM | POA: Diagnosis present

## 2016-10-21 LAB — CBC
HEMATOCRIT: 28.6 % — AB (ref 36.0–46.0)
HEMOGLOBIN: 9 g/dL — AB (ref 12.0–15.0)
MCH: 23.6 pg — ABNORMAL LOW (ref 26.0–34.0)
MCHC: 31.5 g/dL (ref 30.0–36.0)
MCV: 75.1 fL — ABNORMAL LOW (ref 78.0–100.0)
Platelets: 207 10*3/uL (ref 150–400)
RBC: 3.81 MIL/uL — AB (ref 3.87–5.11)
RDW: 25.2 % — ABNORMAL HIGH (ref 11.5–15.5)
WBC: 11.1 10*3/uL — ABNORMAL HIGH (ref 4.0–10.5)

## 2016-10-21 LAB — TYPE AND SCREEN
ABO/RH(D): O POS
ANTIBODY SCREEN: NEGATIVE

## 2016-10-21 LAB — ABO/RH: ABO/RH(D): O POS

## 2016-10-21 LAB — RPR: RPR: NONREACTIVE

## 2016-10-21 MED ORDER — LIDOCAINE HCL (PF) 1 % IJ SOLN
INTRAMUSCULAR | Status: DC | PRN
  Administered 2016-10-21 (×2): 5 mL via EPIDURAL

## 2016-10-21 MED ORDER — EPHEDRINE 5 MG/ML INJ
10.0000 mg | INTRAVENOUS | Status: DC | PRN
  Filled 2016-10-21: qty 2

## 2016-10-21 MED ORDER — ONDANSETRON HCL 4 MG PO TABS
4.0000 mg | ORAL_TABLET | ORAL | Status: DC | PRN
Start: 2016-10-21 — End: 2016-10-23
  Administered 2016-10-21 – 2016-10-22 (×5): 4 mg via ORAL
  Filled 2016-10-21 (×5): qty 1

## 2016-10-21 MED ORDER — DIBUCAINE 1 % RE OINT: 1.0000 "application " | TOPICAL_OINTMENT | RECTAL | Status: DC | PRN

## 2016-10-21 MED ORDER — LACTATED RINGERS IV SOLN
500.0000 mL | INTRAVENOUS | Status: DC | PRN
  Administered 2016-10-21: 300 mL via INTRAVENOUS

## 2016-10-21 MED ORDER — TERBUTALINE SULFATE 1 MG/ML IJ SOLN
0.2500 mg | Freq: Once | INTRAMUSCULAR | Status: DC | PRN
  Filled 2016-10-21: qty 1

## 2016-10-21 MED ORDER — OXYCODONE-ACETAMINOPHEN 5-325 MG PO TABS: 2.0000 | ORAL_TABLET | ORAL | Status: DC | PRN

## 2016-10-21 MED ORDER — OXYCODONE-ACETAMINOPHEN 5-325 MG PO TABS: 1.0000 | ORAL_TABLET | ORAL | Status: DC | PRN

## 2016-10-21 MED ORDER — OXYCODONE HCL 5 MG PO TABS
5.0000 mg | ORAL_TABLET | ORAL | Status: DC | PRN
  Administered 2016-10-21: 5 mg via ORAL
  Filled 2016-10-21: qty 1

## 2016-10-21 MED ORDER — PHENYLEPHRINE 40 MCG/ML (10ML) SYRINGE FOR IV PUSH (FOR BLOOD PRESSURE SUPPORT)
80.0000 ug | PREFILLED_SYRINGE | INTRAVENOUS | Status: DC | PRN
  Filled 2016-10-21: qty 5

## 2016-10-21 MED ORDER — SOD CITRATE-CITRIC ACID 500-334 MG/5ML PO SOLN: 30.0000 mL | ORAL | Status: DC | PRN

## 2016-10-21 MED ORDER — MISOPROSTOL 25 MCG QUARTER TABLET
25.0000 ug | ORAL_TABLET | ORAL | Status: DC | PRN
  Administered 2016-10-21: 25 ug via VAGINAL
  Filled 2016-10-21 (×2): qty 1

## 2016-10-21 MED ORDER — LIDOCAINE HCL (PF) 1 % IJ SOLN
30.0000 mL | INTRAMUSCULAR | Status: DC | PRN
  Filled 2016-10-21: qty 30

## 2016-10-21 MED ORDER — PHENYLEPHRINE 40 MCG/ML (10ML) SYRINGE FOR IV PUSH (FOR BLOOD PRESSURE SUPPORT)
80.0000 ug | PREFILLED_SYRINGE | INTRAVENOUS | Status: DC | PRN
  Filled 2016-10-21: qty 5
  Filled 2016-10-21: qty 10

## 2016-10-21 MED ORDER — OXYCODONE HCL 5 MG PO TABS
10.0000 mg | ORAL_TABLET | ORAL | Status: DC | PRN
  Administered 2016-10-21 – 2016-10-23 (×7): 10 mg via ORAL
  Filled 2016-10-21 (×7): qty 2

## 2016-10-21 MED ORDER — WITCH HAZEL-GLYCERIN EX PADS: 1.0000 "application " | MEDICATED_PAD | CUTANEOUS | Status: DC | PRN

## 2016-10-21 MED ORDER — SENNOSIDES-DOCUSATE SODIUM 8.6-50 MG PO TABS
2.0000 | ORAL_TABLET | ORAL | Status: DC
  Administered 2016-10-21 – 2016-10-22 (×2): 2 via ORAL
  Filled 2016-10-21 (×2): qty 2

## 2016-10-21 MED ORDER — ONDANSETRON HCL 4 MG/2ML IJ SOLN: 4.0000 mg | Freq: Four times a day (QID) | INTRAMUSCULAR | Status: DC | PRN

## 2016-10-21 MED ORDER — PRENATAL MULTIVITAMIN CH: 1.0000 | ORAL_TABLET | Freq: Every day | ORAL | Status: DC

## 2016-10-21 MED ORDER — DIPHENHYDRAMINE HCL 50 MG/ML IJ SOLN: 12.5000 mg | INTRAMUSCULAR | Status: DC | PRN

## 2016-10-21 MED ORDER — SIMETHICONE 80 MG PO CHEW: 80.0000 mg | CHEWABLE_TABLET | ORAL | Status: DC | PRN

## 2016-10-21 MED ORDER — ACETAMINOPHEN 325 MG PO TABS: 650.0000 mg | ORAL_TABLET | ORAL | Status: DC | PRN

## 2016-10-21 MED ORDER — ZOLPIDEM TARTRATE 5 MG PO TABS: 5.0000 mg | ORAL_TABLET | Freq: Every evening | ORAL | Status: DC | PRN

## 2016-10-21 MED ORDER — COCONUT OIL OIL
1.0000 "application " | TOPICAL_OIL | Status: DC | PRN
  Filled 2016-10-21: qty 120

## 2016-10-21 MED ORDER — OXYTOCIN BOLUS FROM INFUSION
500.0000 mL | Freq: Once | INTRAVENOUS | Status: AC
  Administered 2016-10-21: 500 mL via INTRAVENOUS

## 2016-10-21 MED ORDER — FAMOTIDINE 20 MG PO TABS
20.0000 mg | ORAL_TABLET | Freq: Every day | ORAL | Status: DC
  Administered 2016-10-21: 20 mg via ORAL
  Filled 2016-10-21 (×2): qty 1

## 2016-10-21 MED ORDER — IBUPROFEN 600 MG PO TABS
600.0000 mg | ORAL_TABLET | Freq: Four times a day (QID) | ORAL | Status: DC
  Administered 2016-10-21 – 2016-10-23 (×6): 600 mg via ORAL
  Filled 2016-10-21 (×6): qty 1

## 2016-10-21 MED ORDER — ACETAMINOPHEN 325 MG PO TABS
650.0000 mg | ORAL_TABLET | ORAL | Status: DC | PRN
Start: 2016-10-21 — End: 2016-10-23
  Filled 2016-10-21: qty 2

## 2016-10-21 MED ORDER — TETANUS-DIPHTH-ACELL PERTUSSIS 5-2.5-18.5 LF-MCG/0.5 IM SUSP: 0.5000 mL | Freq: Once | INTRAMUSCULAR | Status: DC

## 2016-10-21 MED ORDER — FENTANYL CITRATE (PF) 100 MCG/2ML IJ SOLN: 100.0000 ug | INTRAMUSCULAR | Status: DC | PRN

## 2016-10-21 MED ORDER — OXYTOCIN 40 UNITS IN LACTATED RINGERS INFUSION - SIMPLE MED
2.5000 [IU]/h | INTRAVENOUS | Status: DC
  Filled 2016-10-21: qty 1000

## 2016-10-21 MED ORDER — ONDANSETRON HCL 4 MG/2ML IJ SOLN: 4.0000 mg | INTRAMUSCULAR | Status: DC | PRN

## 2016-10-21 MED ORDER — LACTATED RINGERS IV SOLN
INTRAVENOUS | Status: DC
  Administered 2016-10-21: 13:00:00 via INTRAVENOUS

## 2016-10-21 MED ORDER — LACTATED RINGERS IV SOLN
500.0000 mL | Freq: Once | INTRAVENOUS | Status: AC
  Administered 2016-10-21: 500 mL via INTRAVENOUS

## 2016-10-21 MED ORDER — DIPHENHYDRAMINE HCL 25 MG PO CAPS: 25.0000 mg | ORAL_CAPSULE | Freq: Four times a day (QID) | ORAL | Status: DC | PRN

## 2016-10-21 MED ORDER — FENTANYL 2.5 MCG/ML BUPIVACAINE 1/10 % EPIDURAL INFUSION (WH - ANES)
14.0000 mL/h | INTRAMUSCULAR | Status: DC | PRN
  Administered 2016-10-21: 14 mL/h via EPIDURAL
  Filled 2016-10-21: qty 100

## 2016-10-21 MED ORDER — BENZOCAINE-MENTHOL 20-0.5 % EX AERO: 1.0000 "application " | INHALATION_SPRAY | CUTANEOUS | Status: DC | PRN

## 2016-10-21 MED ORDER — OXYTOCIN 40 UNITS IN LACTATED RINGERS INFUSION - SIMPLE MED: 1.0000 m[IU]/min | INTRAVENOUS | Status: DC

## 2016-10-21 NOTE — Anesthesia Postprocedure Evaluation (Signed)
Anesthesia Post Note  Patient: Eileen [name]  Procedure(s) Performed: * No procedures listed *  Patient location during evaluation: Mother Baby Anesthesia Type: Epidural Level of consciousness: awake and alert Pain management: pain level controlled Vital Signs Assessment: post-procedure vital signs reviewed and stable Respiratory status: spontaneous breathing, nonlabored ventilation and respiratory function stable Cardiovascular status: stable Postop Assessment: no headache, no backache and epidural receding Anesthetic complications: no        Last Vitals:  Vitals:   10/21/16 1800 10/21/16 1852  BP: 119/69 (!) 141/70  Pulse: 95 (!) 112  Resp: 18 18  Temp: 37.1 C 36.7 C    Last Pain:  Vitals:   10/21/16 1852  TempSrc: Oral  PainSc: 4    Pain Goal:                 [employer]

## 2016-10-21 NOTE — H&P (Signed)
LABOR AND DELIVERY ADMISSION HISTORY AND PHYSICAL NOTE  Eileen [name] is a 26 y.o. female [phone] with IUP at [redacted]w[redacted]d by LMP c/w 18 week [place] presenting for IOL for cholestasis.  She was diagnosed based on symptoms and h/o cholestasis.  Has continued to have itching despite use of actigall.  This pregnancy is complicated by maternal tachycardia, seen by cardiology and diagnosed with sinus tachycardia.  Patient did not tolerate metoprolol.  Echo was wnl.  Feeling well today; was having rare contractions at home.  She reports positive fetal movement. She denies leakage of fluid or vaginal bleeding.  Prenatal History/Complications:  Past Medical History: Past Medical History:  Diagnosis Date  . Abortion 08/04/2011  . Anemia   . Anxiety   . Cholestasis during pregnancy   . Chronic kidney disease    kidney infection.  . Depression   . Dysuria   . Fibromyalgia   . Headache(784.0)   . Hypothyroid    history of.  . Mitral valve prolapse   . RA (rheumatoid arthritis) ([place])     Past Surgical History: Past Surgical History:  Procedure Laterality Date  . DILATION AND CURETTAGE OF UTERUS    . LAPAROSCOPY N/A 05/25/2014   Procedure: LAPAROSCOPY DIAGNOSTIC;  Surgeon: Osborne Oman, MD;  Location: [facility];  Service: Gynecology;  Laterality: N/A;  . THERAPEUTIC ABORTION N/A 2012    Obstetrical History: OB History    Gravida Para Term Preterm AB Living   6 3 3  0 2 3   SAB TAB Ectopic Multiple Live Births     2 0 0 3      Social History: Social History   Social History  . Marital status: Single    Spouse name: N/A  . Number of children: N/A  . Years of education: N/A   Social History Main Topics  . Smoking status: Current Every Day Smoker    Packs/day: 0.50    Types: Cigarettes  . Smokeless tobacco: Current User  . Alcohol use 0.0 oz/week     Comment: occasional but not while pregnant  . Drug use: No  . Sexual activity: Yes    Partners: Male    Birth control/ protection: None      Comment: Mirena   Other Topics Concern  . None   Social History Narrative  . None    Family History: Family History  Problem Relation Age of Onset  . Cancer Mother     SKIN  . Hyperlipidemia Mother   . Thyroid disease Mother   . Heart disease Mother   . Mitral valve prolapse Mother   . Heart attack Mother   . Asthma Brother   . Diabetes Maternal Grandmother   . Hyperlipidemia Maternal Grandmother   . Emphysema Paternal Grandfather     Allergies: Allergies  Allergen Reactions  . Eggs Or Egg-Derived Products Nausea Only  . Other Hives and Nausea And Vomiting    Green beans  . Red Dye Nausea And Vomiting  . Sulfa Antibiotics Hives and Nausea And Vomiting    Childhood reaction    Prescriptions Prior to Admission  Medication Sig Dispense Refill Last Dose  . acetaminophen (TYLENOL) 500 MG tablet Take 500 mg by mouth daily as needed for mild pain or headache.    Taking  . amitriptyline (ELAVIL) 10 MG tablet Take 1 tablet (10 mg total) by mouth at bedtime. (Patient not taking: Reported on 09/28/2016) 30 tablet 2 Not Taking  . cyclobenzaprine (FLEXERIL) 10 MG tablet Take  1 tablet (10 mg total) by mouth 2 (two) times daily as needed for muscle spasms. (Patient not taking: Reported on 08/04/2016) 20 tablet 0 Not Taking  . famotidine (PEPCID) 20 MG tablet Take 1 tablet (20 mg total) by mouth daily. 30 tablet 1 Taking  . ferrous sulfate (FERROUSUL) 325 (65 FE) MG tablet Take 1 tablet (325 mg total) by mouth 2 (two) times daily. (Patient not taking: Reported on 08/25/2016) 60 tablet 1 Not Taking  . Prenatal MV-Min-FA-Omega-3 (PRENATAL GUMMIES/DHA & FA) 0.4-32.5 MG CHEW Chew 2 tablets by mouth daily.    Taking  . promethazine (PHENERGAN) 25 MG tablet Take 1 tablet (25 mg total) by mouth every 6 (six) hours as needed for nausea or vomiting. 30 tablet 1 Taking  . ursodiol (ACTIGALL) 500 MG tablet Take 1 tablet (500 mg total) by mouth 2 (two) times daily. 60 tablet 3 Taking  *only taking  pepcid, prenatals, phenergan, and actigall*   Review of Systems   All systems reviewed and negative except as stated in HPI  Last menstrual period 02/05/2016, unknown if currently breastfeeding. General appearance: alert, cooperative, appears stated age and no distress Lungs: no respiratory distress Heart: tachycardic, 2+ pulses Abdomen: soft, non-tender; gravid Extremities: No calf swelling or tenderness Presentation: cephalic Fetal monitoring: category I - baseline 145, moderate variability, +accels, no decels Uterine activity: occasional ctx, uterine irritability   Prenatal labs: ABO, Rh: O/Positive/-- (09/12 1023) Antibody: Negative (09/12 1023) Rubella: immune RPR: Non Reactive (01/30 1030)  HBsAg: Negative (09/12 1023)  HIV: Non Reactive (01/30 1030)  GBS: Negative (03/19 0000)  2 hr Glucola: 87/139/112 Genetic screening:  materniT21 wnl Anatomy US: boy, wnl  Prenatal Transfer Tool  Maternal Diabetes: No Genetic Screening: Normal Maternal Ultrasounds/Referrals: Normal Fetal Ultrasounds or other Referrals:  None Maternal Substance Abuse:  Yes:  Type: Smoker Significant Maternal Medications:  Meds include: Other:  amitriptyline, flexeril, metoprolol, pepcid, phenergan, actigall Significant Maternal Lab Results: Lab values include: Group B Strep negative  No results found for this or any previous visit (from the past 24 hour(s)).  Patient Active Problem List   Diagnosis Date Noted  . Cholestasis during pregnancy in third trimester 10/21/2016  . Cholestasis during pregnancy, antepartum 09/30/2016  . Anemia affecting pregnancy 08/04/2016  . Back pain affecting pregnancy 08/04/2016  . Tachycardia 06/16/2016  . Supervision of normal pregnancy, antepartum 06/09/2016  . Rheumatoid arthritis ([place]) 04/07/2016  . Tobacco abuse 04/07/2016  . Depression with anxiety 11/01/2013  . Anxiety 08/01/2013  . Goiter, unspecified 06/06/2007    Assessment: Eileen [name] is a 26  y.o. [id number] at [redacted]w[redacted]d here for IOL for cholestasis of pregnancy  #Labor: induction; foley bulb and 1st cytotec given at [facility] #Pain: Plans on epidural #FWB: Category I #ID:  none #MOF: breast/bottle #MOC:BTL #Circ:  Yes (outpatient) #tachycardia: stable, monitor #cholestasis: IOL as above  Lulu Riding 10/21/2016, 7:52 AM  OB FELLOW HISTORY AND PHYSICAL ATTESTATION  I confirm that I have verified the information documented in the resident's note and that I have also personally reperformed the physical exam and all medical decision making activities.   Eileen [name] 10/21/2016, 9:54 AM

## 2016-10-21 NOTE — Anesthesia Pain Management Evaluation Note (Addendum)
  CRNA Pain Management Visit Note  Patient: Eileen [name], 26 y.o., female  "Hello I am a member of the anesthesia team at [facility]. We have an anesthesia team available at all times to provide care throughout the hospital, including epidural management and anesthesia for C-section. I don't know your plan for the delivery whether it a natural birth, water birth, IV sedation, nitrous supplementation, doula or epidural, but we want to meet your pain goals."   1.Was your pain managed to your expectations on prior hospitalizations?   Yes   2.What is your expectation for pain management during this hospitalization?     Epidural  3.How can we help you reach that goal?  Epidural when possible.  Record the patient's initial score and the patient's pain goal.   Pain: 2  Pain Goal: 5 [facility] wants you to be able to say your pain was always managed very well.  Eileen [name] 10/21/2016

## 2016-10-21 NOTE — Progress Notes (Signed)
LABOR PROGRESS NOTE  Subjective: More comfortable s/p epidural  Objective: BP 110/76   Pulse (!) 109   Temp 98.1 F (36.7 C) (Oral)   Resp 18   Ht 4\' 10"  (1.473 m)   Wt 140 lb (63.5 kg)   LMP 02/05/2016 (Exact Date)   BMI 29.26 kg/m    Dilation: 6 Effacement (%): 80 Cervical Position: Posterior Station: -2 Presentation: Vertex Exam by:: Queen Blossom, RN Ivin Poot, RN   Assessment / Plan: 26 y.o. [phone] at [redacted]w[redacted]d here for IOL for cholestasis of pregnancy  Labor: given 1 cytotec, now contracting well on her own s/p foley bulb. AROM with clear fluid ~1400.  Expectant management. Fetal Wellbeing:  Category I; baseline 135, mod variability, +accels, no decels Pain Control:  Epidural in place Anticipated MOD:  Vaginal tachycardia: stable, monitor cholestasis: IOL as above   Lulu Riding, MD 10/21/2016, 2:00 PM

## 2016-10-21 NOTE — Progress Notes (Signed)
Pt admitted for IOL for cholestasis, see H&P for full admission documentation Good fm, irregular uc's, no vb or lof VS: 98.5, 104, 16, 123/75 FHR: 135, mod variability, 15x15accels, no decels=Cat 1 UCs: irregular Cytotec 12mcg placed in posterior fornix Cervical foley bulb inserted and inflated w/ 34ml LR w/o difficulty  Plan pitocin when foley bulb out Roma Schanz, CNM, [facility] 10/21/2016 [phone]

## 2016-10-21 NOTE — Anesthesia Procedure Notes (Signed)
Procedures

## 2016-10-21 NOTE — Anesthesia Preprocedure Evaluation (Signed)
Anesthesia Evaluation  Patient identified by MRN, date of birth, ID band Patient awake    Reviewed: Allergy & Precautions, H&P , Patient's Chart, lab work & pertinent test results, reviewed documented beta blocker date and time   History of Anesthesia Complications Negative for: history of anesthetic complications  Airway Mallampati: II  TM Distance: >3 FB Neck ROM: full    Dental no notable dental hx. (+) Dental Advisory Given   Pulmonary Current Smoker,    Pulmonary exam normal breath sounds clear to auscultation       Cardiovascular negative cardio ROS   Rhythm:regular Rate:Normal     Neuro/Psych  Headaches, PSYCHIATRIC DISORDERS Anxiety    GI/Hepatic negative GI ROS, Neg liver ROS,   Endo/Other  negative endocrine ROS  Renal/GU negative Renal ROS     Musculoskeletal   Abdominal   Peds  Hematology   Anesthesia Other Findings   Reproductive/Obstetrics                             Anesthesia Physical  Anesthesia Plan  ASA: II  Anesthesia Plan: Epidural   Post-op Pain Management:    Induction:   Airway Management Planned: Natural Airway  Additional Equipment:   Intra-op Plan:   Post-operative Plan:   Informed Consent: I have reviewed the patients History and Physical, chart, labs and discussed the procedure including the risks, benefits and alternatives for the proposed anesthesia with the patient or authorized representative who has indicated his/her understanding and acceptance.   Dental advisory given  Plan Discussed with: Anesthesiologist  Anesthesia Plan Comments:         Anesthesia Quick Evaluation

## 2016-10-21 NOTE — Anesthesia Procedure Notes (Signed)
Epidural Patient location during procedure: OB Start time: 10/21/2016 12:28 PM End time: 10/21/2016 12:37 PM  Staffing Anesthesiologist: Duane Boston Performed: anesthesiologist   Preanesthetic Checklist Completed: patient identified, site marked, pre-op evaluation, timeout performed, IV checked, risks and benefits discussed and monitors and equipment checked  Epidural Patient position: sitting Prep: DuraPrep Patient monitoring: heart rate, cardiac monitor, continuous pulse ox and blood pressure Approach: midline Location: L2-L3 Injection technique: LOR saline  Needle:  Needle type: Tuohy  Needle gauge: 17 G Needle length: 9 cm Needle insertion depth: 5 cm Catheter size: 20 Guage Catheter at skin depth: 9 cm Test dose: negative and Other  Assessment Events: blood not aspirated, injection not painful, no injection resistance and negative IV test  Additional Notes Informed consent obtained prior to proceeding including risk of failure, 1% risk of PDPH, risk of minor discomfort and bruising.  Discussed rare but serious complications including epidural abscess, permanent nerve injury, epidural hematoma.  Discussed alternatives to epidural analgesia and patient desires to proceed.  Timeout performed pre-procedure verifying patient name, procedure, and platelet count.  Patient tolerated procedure well.

## 2016-10-21 NOTE — Lactation Note (Signed)
This note was copied from a baby's chart. Lactation Consultation Note  Patient Name: Eileen [name] Date: 10/21/2016 Reason for consult: Initial assessment   Initial consult with Exp BF mom of < 1 hour old infant. Mom reports she did not BF her 33 and 26 yo. She reports she BF her 26 yo for 3 months, she experienced cracked/bleeding nipples and low milk supply and supplemented with formula later in the process. She reports she would like to try to BF this infant. Mom reports + breast changes with pregnancy.   Infant STS with mom and cueing to feed. Mom with small compressible breasts with everted nipples. Mom was able to independently hand express colostrum very easily. Infant latched on both breasts easily, Mom reports after 5 minutes each time that there is pain/pinching and she would take infant off. Infant was spoon fed 6 cc colostrum via spoon and tolerated it well (mom and GM were shown how to spoon feed). Enc mom to hand express and spoon feed after each BF. Mom then relatched him to the right breast, he was still feeding when I left the room.   Mom kept asking when to give formula, enc mom to hand express and pump to offer infant EBM. Mom voiced understanding. LPT infant policy given and explained due to infant being [redacted] week GA. Enc mom to keep infant hat on, feed infant at least every 3 hours, keep light dimmed and call for assistance with feeding as needed. Feeding log given with instructions for use.  BF Resources Handout and [place] Brochure given, mom informed of IP/OP Services, BF Support Groups and [place] phone #. Mom is a [facility] client and is aware to call and make appt post d/c.           Maternal Data Formula Feeding for Exclusion: Yes Reason for exclusion: Mother's choice to formula and breast feed on admission Has patient been taught Hand Expression?: Yes Does the patient have breastfeeding experience prior to this delivery?: Yes  Feeding Feeding Type: Breast Fed Length  of feed: 15 min  LATCH Score/Interventions Latch: Grasps breast easily, tongue down, lips flanged, rhythmical sucking.  Audible Swallowing: Spontaneous and intermittent  Type of Nipple: Everted at rest and after stimulation  Comfort (Breast/Nipple): Filling, red/small blisters or bruises, mild/mod discomfort  Problem noted: Mild/Moderate discomfort (deepened latch)  Hold (Positioning): Assistance needed to correctly position infant at breast and maintain latch. Intervention(s): Breastfeeding basics reviewed;Support Pillows;Position options;Skin to skin  LATCH Score: 8  Lactation Tools Discussed/Used WIC Program: Yes   Consult Status Consult Status: Follow-up Date: 10/22/16 Follow-up type: In-patient    Debby Freiberg Derrell Milanes 10/21/2016, 5:45 PM

## 2016-10-22 ENCOUNTER — Encounter (HOSPITAL_COMMUNITY)
Admission: RE | Disposition: A | Discharge status: Home / Self Care | Payer: Self-pay | Source: Ambulatory Visit | Attending: Obstetrics & Gynecology

## 2016-10-22 ENCOUNTER — Inpatient Hospital Stay (HOSPITAL_COMMUNITY): Payer: Medicaid Other | Admitting: Anesthesiology

## 2016-10-22 ENCOUNTER — Encounter (HOSPITAL_COMMUNITY): Payer: Self-pay

## 2016-10-22 DIAGNOSIS — Z302 Encounter for sterilization: Secondary | ICD-10-CM

## 2016-10-22 HISTORY — PX: TUBAL LIGATION: SHX77

## 2016-10-22 SURGERY — LIGATION, FALLOPIAN TUBE, POSTPARTUM
Anesthesia: Epidural | Site: Abdomen | Laterality: Bilateral

## 2016-10-22 MED ORDER — MIDAZOLAM HCL 5 MG/5ML IJ SOLN
INTRAMUSCULAR | Status: DC | PRN
  Administered 2016-10-22: 1 mg via INTRAVENOUS

## 2016-10-22 MED ORDER — LIDOCAINE-EPINEPHRINE (PF) 2 %-1:200000 IJ SOLN
INTRAMUSCULAR | Status: AC
Start: 2016-10-22 — End: 2016-10-22
  Filled 2016-10-22: qty 20

## 2016-10-22 MED ORDER — METOCLOPRAMIDE HCL 10 MG PO TABS
10.0000 mg | ORAL_TABLET | Freq: Once | ORAL | Status: AC
  Administered 2016-10-22: 10 mg via ORAL
  Filled 2016-10-22: qty 1

## 2016-10-22 MED ORDER — LIDOCAINE-EPINEPHRINE (PF) 2 %-1:200000 IJ SOLN
INTRAMUSCULAR | Status: DC | PRN
  Administered 2016-10-22: 10 mL via EPIDURAL
  Administered 2016-10-22: 2 mL via EPIDURAL
  Administered 2016-10-22: 5 mL via EPIDURAL
  Administered 2016-10-22: 3 mL via EPIDURAL

## 2016-10-22 MED ORDER — BUPIVACAINE HCL (PF) 0.25 % IJ SOLN
INTRAMUSCULAR | Status: AC
  Filled 2016-10-22: qty 30

## 2016-10-22 MED ORDER — MIDAZOLAM HCL 2 MG/2ML IJ SOLN
INTRAMUSCULAR | Status: AC
  Filled 2016-10-22: qty 2

## 2016-10-22 MED ORDER — SODIUM BICARBONATE 8.4 % IV SOLN
INTRAVENOUS | Status: AC
  Filled 2016-10-22: qty 50

## 2016-10-22 MED ORDER — FENTANYL CITRATE (PF) 100 MCG/2ML IJ SOLN: 25.0000 ug | INTRAMUSCULAR | Status: DC | PRN

## 2016-10-22 MED ORDER — METOCLOPRAMIDE HCL 10 MG PO TABS: 10.0000 mg | ORAL_TABLET | Freq: Once | ORAL | Status: DC

## 2016-10-22 MED ORDER — SODIUM CHLORIDE 0.9% FLUSH
INTRAVENOUS | Status: AC
  Filled 2016-10-22: qty 3

## 2016-10-22 MED ORDER — POLYSACCHARIDE IRON COMPLEX 150 MG PO CAPS
150.0000 mg | ORAL_CAPSULE | Freq: Two times a day (BID) | ORAL | Status: DC
  Administered 2016-10-22 (×2): 150 mg via ORAL
  Filled 2016-10-22 (×2): qty 1

## 2016-10-22 MED ORDER — LACTATED RINGERS IV SOLN
INTRAVENOUS | Status: DC | PRN
  Administered 2016-10-22: 10:00:00 via INTRAVENOUS

## 2016-10-22 MED ORDER — BUPIVACAINE HCL (PF) 0.25 % IJ SOLN
INTRAMUSCULAR | Status: DC | PRN
  Administered 2016-10-22: 20 mL

## 2016-10-22 MED ORDER — LIDOCAINE-EPINEPHRINE (PF) 2 %-1:200000 IJ SOLN
INTRAMUSCULAR | Status: AC
  Filled 2016-10-22: qty 20

## 2016-10-22 MED ORDER — FAMOTIDINE 20 MG PO TABS: 40.0000 mg | ORAL_TABLET | Freq: Once | ORAL | Status: DC

## 2016-10-22 MED ORDER — ONDANSETRON HCL 4 MG/2ML IJ SOLN
INTRAMUSCULAR | Status: DC | PRN
  Administered 2016-10-22: 4 mg via INTRAVENOUS

## 2016-10-22 MED ORDER — FAMOTIDINE 20 MG PO TABS
40.0000 mg | ORAL_TABLET | Freq: Once | ORAL | Status: AC
  Administered 2016-10-22: 40 mg via ORAL
  Filled 2016-10-22: qty 2

## 2016-10-22 MED ORDER — LACTATED RINGERS IV SOLN: INTRAVENOUS | Status: DC

## 2016-10-22 SURGICAL SUPPLY — 23 items
CONTAINER PREFILL 10% NBF 15ML (MISCELLANEOUS) ×4 IMPLANT
DRSG OPSITE POSTOP 3X4 (GAUZE/BANDAGES/DRESSINGS) ×3 IMPLANT
DURAPREP 26ML APPLICATOR (WOUND CARE) ×3 IMPLANT
GLOVE BIOGEL PI IND STRL 6.5 (GLOVE) ×1 IMPLANT
GLOVE BIOGEL PI IND STRL 7.0 (GLOVE) ×1 IMPLANT
GLOVE BIOGEL PI INDICATOR 6.5 (GLOVE) ×2
GLOVE BIOGEL PI INDICATOR 7.0 (GLOVE) ×2
GLOVE SURG SS PI 6.0 STRL IVOR (GLOVE) ×3 IMPLANT
GOWN STRL REUS W/TWL LRG LVL3 (GOWN DISPOSABLE) ×6 IMPLANT
NEEDLE HYPO 22GX1.5 SAFETY (NEEDLE) IMPLANT
NS IRRIG 1000ML POUR BTL (IV SOLUTION) ×3 IMPLANT
PACK ABDOMINAL MINOR (CUSTOM PROCEDURE TRAY) ×3 IMPLANT
PROTECTOR NERVE ULNAR (MISCELLANEOUS) ×3 IMPLANT
SPONGE GAUZE 2X2 8PLY STER LF (GAUZE/BANDAGES/DRESSINGS)
SPONGE GAUZE 2X2 8PLY STRL LF (GAUZE/BANDAGES/DRESSINGS) ×1 IMPLANT
SPONGE LAP 4X18 X RAY DECT (DISPOSABLE) IMPLANT
SUT PLAIN 0 NONE (SUTURE) ×3 IMPLANT
SUT VIC AB 0 CT1 27 (SUTURE) ×3
SUT VIC AB 0 CT1 27XBRD ANBCTR (SUTURE) ×1 IMPLANT
SUT VIC AB 3-0 PS2 18 (SUTURE) ×3 IMPLANT
SYR CONTROL 10ML LL (SYRINGE) IMPLANT
TOWEL OR 17X24 6PK STRL BLUE (TOWEL DISPOSABLE) ×6 IMPLANT
TRAY FOLEY CATH SILVER 14FR (SET/KITS/TRAYS/PACK) ×3 IMPLANT

## 2016-10-22 NOTE — Lactation Note (Signed)
This note was copied from a baby's chart. Lactation Consultation Note  Patient Name: Eileen [name] [id number] Date: 10/22/2016 Reason for consult: Follow-up assessment Baby at 29 hr of life. Mom is worried about supply because "my milk never came in with my other children". She tried using the [place] before she went to surgery "my I only got a little bit". Discussed baby behavior, feeding frequency, pumping, supplementing, baby belly size, voids, wt loss, maternal diet, breast changes, and nipple care. She reported bilateral nipple cracks. No skin break down or bruising noted. She claims that her L nipple was bleeding, it looks like a normal ridge in her nipple surface but gave her comfort gels. Mom is aware of lactation services and support group. She will offer the breast on demand 8+/24hr, post pump, and supplement as need per volume guidelines.    Maternal Data    Feeding Feeding Type: Breast Fed Length of feed: 30 min (on and off)  LATCH Score/Interventions Latch: Grasps breast easily, tongue down, lips flanged, rhythmical sucking.  Audible Swallowing: A few with stimulation Intervention(s): Skin to skin;Hand expression  Type of Nipple: Everted at rest and after stimulation  Comfort (Breast/Nipple): Filling, red/small blisters or bruises, mild/mod discomfort  Problem noted: Mild/Moderate discomfort;Cracked, bleeding, blisters, bruises Interventions  (Cracked/bleeding/bruising/blister): Expressed breast milk to nipple Interventions (Mild/moderate discomfort): Comfort gels (coconut oil)  Hold (Positioning): No assistance needed to correctly position infant at breast. Intervention(s): Support Pillows  LATCH Score: 8  Lactation Tools Discussed/Used Pump Review: Milk Storage   Consult Status Consult Status: Follow-up Date: 10/23/16 Follow-up type: In-patient    Denzil Hughes 10/22/2016, 9:44 PM

## 2016-10-22 NOTE — Progress Notes (Signed)
25 y.o. yo [phone]  with undesired fertility,status post vaginal delivery, desires permanent sterilization. Risks and benefits of procedure discussed with patient including permanence of method, bleeding, infection, injury to surrounding organs and need for additional procedures. Risk failure of 0.5-1% with increased risk of ectopic gestation if pregnancy occurs was also discussed with patient.

## 2016-10-22 NOTE — Transfer of Care (Signed)
Immediate Anesthesia Transfer of Care Note  Patient: Eileen [name]  Procedure(s) Performed: Procedure(s): POST PARTUM TUBAL LIGATION (Bilateral)  Patient Location: PACU  Anesthesia Type:Epidural  Level of Consciousness: awake, alert  and oriented  Airway & Oxygen Therapy: Patient Spontanous Breathing  Post-op Assessment: Report given to RN and Post -op Vital signs reviewed and stable  Post vital signs: Reviewed and stable  Last Vitals:  Vitals:   10/22/16 1317 10/22/16 1726  BP: (!) 115/58 109/72  Pulse: 69 90  Resp: 16 16  Temp: 36.9 C 36.7 C    Last Pain:  Vitals:   10/22/16 1726  TempSrc: Oral  PainSc:       Patients Stated Pain Goal: 3 (56/81/27 5170)  Complications: No apparent anesthesia complications

## 2016-10-22 NOTE — Progress Notes (Signed)
Pt complaining of right side abdomin pain radiating to back.  Abd tender to touch. Pain of 8. MD notified and coming to bedside.

## 2016-10-22 NOTE — Anesthesia Postprocedure Evaluation (Signed)
Anesthesia Post Note  Patient: Eileen [name]  Procedure(s) Performed: Procedure(s) (LRB): POST PARTUM TUBAL LIGATION (Bilateral)  Patient location during evaluation: Mother Baby Anesthesia Type: Epidural Level of consciousness: awake, awake and alert and oriented Pain management: pain level controlled Vital Signs Assessment: post-procedure vital signs reviewed and stable Respiratory status: spontaneous breathing and nonlabored ventilation Cardiovascular status: stable Postop Assessment: no headache, no backache, epidural receding, patient able to bend at knees, no signs of nausea or vomiting and adequate PO intake Anesthetic complications: no        Last Vitals:  Vitals:   10/22/16 1300 10/22/16 1317  BP:  (!) 115/58  Pulse:  69  Resp:  16  Temp: 36.7 C 36.9 C    Last Pain:  Vitals:   10/22/16 1317  TempSrc: Oral  PainSc: 4    Pain Goal: Patients Stated Pain Goal: 3 (10/22/16 1200)               Shamond Skelton Hristova

## 2016-10-22 NOTE — Progress Notes (Signed)
Post Partum Day #1 Subjective: no complaints, up ad lib, voiding, tolerating PO and breast feeding is going well  Objective: Blood pressure 118/67, pulse 97, temperature 98 F (36.7 C), temperature source Oral, resp. rate 18, height 4\' 10"  (1.473 m), weight 140 lb (63.5 kg), last menstrual period 02/05/2016, unknown if currently breastfeeding.  Physical Exam:  General: alert, cooperative and no distress Lochia: appropriate Uterine Fundus: firm Incision: none DVT Evaluation: No evidence of DVT seen on physical exam. No cords or calf tenderness. No significant calf/ankle edema.   Recent Labs  10/21/16 0745  HGB 9.0*  HCT 28.6*    Assessment/Plan: Plan for discharge tomorrow, Breastfeeding and Contraception BTL, scheduled for this AM, has been NPO since midnight. Anemia: asymptomatic. Iron started.    LOS: 1 day   Morene Crocker, CNM 10/22/2016, 8:01 AM

## 2016-10-22 NOTE — Anesthesia Preprocedure Evaluation (Signed)
Anesthesia Evaluation  Patient identified by MRN, date of birth, ID band Patient awake    Reviewed: Allergy & Precautions, H&P , Patient's Chart, lab work & pertinent test results, reviewed documented beta blocker date and time   History of Anesthesia Complications Negative for: history of anesthetic complications  Airway Mallampati: II  TM Distance: >3 FB Neck ROM: full    Dental no notable dental hx. (+) Dental Advisory Given   Pulmonary Current Smoker,    Pulmonary exam normal breath sounds clear to auscultation       Cardiovascular negative cardio ROS   Rhythm:regular Rate:Normal     Neuro/Psych  Headaches, PSYCHIATRIC DISORDERS Anxiety    GI/Hepatic negative GI ROS, Neg liver ROS,   Endo/Other  negative endocrine ROS  Renal/GU negative Renal ROS     Musculoskeletal   Abdominal   Peds  Hematology   Anesthesia Other Findings   Reproductive/Obstetrics                             Anesthesia Physical  Anesthesia Plan  ASA: II  Anesthesia Plan: Epidural   Post-op Pain Management:    Induction:   Airway Management Planned: Natural Airway  Additional Equipment:   Intra-op Plan:   Post-operative Plan:   Informed Consent: I have reviewed the patients History and Physical, chart, labs and discussed the procedure including the risks, benefits and alternatives for the proposed anesthesia with the patient or authorized representative who has indicated his/her understanding and acceptance.   Dental advisory given  Plan Discussed with: Anesthesiologist  Anesthesia Plan Comments:         Anesthesia Quick Evaluation

## 2016-10-22 NOTE — Transfer of Care (Signed)
Immediate Anesthesia Transfer of Care Note  Patient: Eileen [name]  Procedure(s) Performed: Procedure(s): POST PARTUM TUBAL LIGATION (Bilateral)  Patient Location: PACU  Anesthesia Type:Epidural  Level of Consciousness: awake, alert  and oriented  Airway & Oxygen Therapy: Patient Spontanous Breathing  Post-op Assessment: Report given to RN and Post -op Vital signs reviewed and stable  Post vital signs: Reviewed and stable  Last Vitals:  Vitals:   10/21/16 2321 10/22/16 0902  BP: 118/67 118/73  Pulse: 97 91  Resp: 18 18  Temp: 36.7 C 36.3 C    Last Pain:  Vitals:   10/22/16 0902  TempSrc: Oral  PainSc:          Complications: No apparent anesthesia complications

## 2016-10-22 NOTE — Op Note (Signed)
PREOPERATIVE DIAGNOSIS:  Undesired fertility  POSTOPERATIVE DIAGNOSIS:  Undesired fertility  PROCEDURE:  Postpartum Bilateral Tubal Sterilization using Pomeroy method   ANESTHESIA:  Epidural  COMPLICATIONS:  None immediate.  ESTIMATED BLOOD LOSS:  Less than 20cc.  FLUIDS: 100 cc LR.  URINE OUTPUT:  400 cc of clear urine.  INDICATIONS: 26 y.o. yo [id number]  with undesired fertility,status post vaginal delivery, desires permanent sterilization. Risks and benefits of procedure discussed with patient including permanence of method, bleeding, infection, injury to surrounding organs and need for additional procedures. Risk failure of 0.5-1% with increased risk of ectopic gestation if pregnancy occurs was also discussed with patient.   FINDINGS:  Normal uterus, tubes, and ovaries.  TECHNIQUE: After informed consent was obtained, the patient was taken to the operating room where anesthesia was induced and found to be adequate. A small transverse, infraumbilical skin incision was made with the scalpel. This incision was carried down to the underlying layer of fascia. The fascia was grasped with Kocher clamps tented up and entered sharply with Mayo scissors. Underlying peritoneum was then identified tented up and entered sharply with Metzenbaum scissors. The fascia was tagged with 0 Vicryl. The patient's left fallopian tube was then identified, brought to the incision, and grasped with a Babcock clamp. The tube was then followed out to the fimbria. The Babcock clamp was then used to grasp the tube approximately 4 cm from the cornual region. A 3 cm segment of the tube was then ligated with free tie of plain gut suture, transected and excised. Good hemostasis was noted and the tube was returned to the abdomen. The right fallopian tube was then identified to its fimbriated end, ligated, and a 3 cm segment excised in a similar fashion. Excellent hemostasis was noted, and the tube returned to the abdomen. The  fascia was re-approximated with 0 Vicryl. The skin was closed in a subcuticular fashion with 3-0 Vicryl. Quarter percent Marcaine solution was then injected at the incision site. The patient tolerated the procedure well. Sponge, lap, and needle count were correct x2. The patient was taken to recovery room in stable condition.

## 2016-10-22 NOTE — Progress Notes (Signed)
MOB was referred for history of depression/anxiety. * Referral screened out by Clinical Social Worker because none of the following criteria appear to apply: ~ History of anxiety/depression during this pregnancy, or of post-partum depression. ~ Diagnosis of anxiety and/or depression within last 3 years OR * MOB's symptoms currently being treated with medication and/or therapy. Please contact the Clinical Social Worker if needs arise, or if MOB requests.

## 2016-10-23 MED ORDER — IBUPROFEN 600 MG PO TABS: 600.0000 mg | ORAL_TABLET | Freq: Four times a day (QID) | ORAL | 0 refills | Status: DC

## 2016-10-23 MED ORDER — OXYCODONE HCL 5 MG PO TABS: 5.0000 mg | ORAL_TABLET | ORAL | 0 refills | Status: DC | PRN

## 2016-10-23 NOTE — Discharge Instructions (Signed)

## 2016-10-23 NOTE — Lactation Note (Signed)
This note was copied from a baby's chart. Lactation Consultation Note  Patient Name: Eileen [name] Baby [id number] Date: 10/23/2016 Reason for consult: Follow-up assessment Mom asked to see [place] to inquire about medication she is planning to start postpartum for depression. Mom inquired about Cymbalta, L3 per Walker Kehr, precautions discussed with Mom and information from Rodell Perna "Medications and Mother's Milk" given for Mom to review and discuss with MD. Mom reports this med works for her depression as well as fibromyalgia and RA. Mom also inquired about supplements to take to support breast milk production. Information on Fenugreek given for review.   Maternal Data    Feeding Feeding Type: Breast Fed Length of feed: 25 min  LATCH Score/Interventions Latch: Grasps breast easily, tongue down, lips flanged, rhythmical sucking.  Audible Swallowing: A few with stimulation  Type of Nipple: Everted at rest and after stimulation  Comfort (Breast/Nipple): Filling, red/small blisters or bruises, mild/mod discomfort  Problem noted: Cracked, bleeding, blisters, bruises;Mild/Moderate discomfort Interventions  (Cracked/bleeding/bruising/blister): Expressed breast milk to nipple Interventions (Mild/moderate discomfort): Comfort gels  Hold (Positioning): Assistance needed to correctly position infant at breast and maintain latch. Intervention(s): Breastfeeding basics reviewed;Support Pillows;Position options;Skin to skin  LATCH Score: 7  Lactation Tools Discussed/Used Tools: Comfort gels   Consult Status Consult Status: Complete Date: 10/23/16 Follow-up type: In-patient    [name] 10/23/2016, 10:41 AM

## 2016-10-23 NOTE — Lactation Note (Signed)
This note was copied from a baby's chart. Lactation Consultation Note  Patient Name: Eileen [name] Date: 10/23/2016 Reason for consult: Follow-up assessment;Breast/nipple pain;Other (Comment) (early term at 37.0 wks. ) Mom had baby latched to left breast when LC arrived, baby demonstrating some good suckling bursts but bottom lip tucked and Mom reports discomfort with nursing. LC demonstrated how to un-tuck lower lip for more depth with latch. Reviewed positioning with Mom to obtain more depth with initial latch and sustain depth during the feeding. Slight bruising noted on nipples, blister on left nipple. Advised to apply EBM, comfort gels given with instructions. Advised baby should be at breast 8-12 times or more in 24 hours and with feeding ques. Cluster feeding reviewed. Engorgement care discussed. Advised of OP services and support group. Encouraged to call for questions/concerns.   Maternal Data    Feeding Feeding Type: Breast Fed Length of feed: 25 min  LATCH Score/Interventions Latch: Grasps breast easily, tongue down, lips flanged, rhythmical sucking.  Audible Swallowing: A few with stimulation  Type of Nipple: Everted at rest and after stimulation  Comfort (Breast/Nipple): Filling, red/small blisters or bruises, mild/mod discomfort  Problem noted: Cracked, bleeding, blisters, bruises;Mild/Moderate discomfort Interventions  (Cracked/bleeding/bruising/blister): Expressed breast milk to nipple Interventions (Mild/moderate discomfort): Comfort gels  Hold (Positioning): Assistance needed to correctly position infant at breast and maintain latch. Intervention(s): Breastfeeding basics reviewed;Support Pillows;Position options;Skin to skin  LATCH Score: 7  Lactation Tools Discussed/Used Tools: Comfort gels   Consult Status Consult Status: Complete Date: 10/23/16 Follow-up type: In-patient    [name] 10/23/2016, 9:40 AM

## 2016-10-23 NOTE — Discharge Summary (Signed)
OB Discharge Summary  Patient Name: Eileen [name] DOB: [birth year] MRN: [id number]  Date of admission: 10/21/2016 Delivering MD: Maryelizabeth Kaufmann   Date of discharge: 10/23/2016  Admitting diagnosis: INDUCTION desires sterilization Intrauterine pregnancy: [redacted]w[redacted]d     Secondary diagnosis:Active Problems:   Cholestasis during pregnancy in third trimester   Encounter for sterilization     Discharge diagnosis: Term Pregnancy Delivered                                                                     Post partum procedures:postpartum tubal ligation  Augmentation: Cytotec and Foley Balloon  Complications: None  Hospital course:  Induction of Labor With Vaginal Delivery   26 y.o. yo [id number] at [redacted]w[redacted]d was admitted to the hospital 10/21/2016 for induction of labor.  Indication for induction: Cholestasis of pregnancy.  Patient had an uncomplicated labor course as follows: Membrane Rupture Time/Date: 1:56 PM ,10/21/2016   Intrapartum Procedures: Episiotomy: None [1]                                         Lacerations:  None [1]  Patient had delivery of a Viable infant.  Information for the patient's newborn:  Narely, Nobles [[id number]]  Delivery Method: Vaginal, Spontaneous Delivery (Filed from Delivery Summary)   10/21/2016  Details of delivery can be found in separate delivery note.  Patient had a routine postpartum course. Patient is discharged home 10/23/16.  Physical exam  Vitals:   10/22/16 1300 10/22/16 1317 10/22/16 1726 10/23/16 0415  BP: 110/76 (!) 115/58 109/72 108/73  Pulse: 82 69 90 83  Resp: 15 16 16 18   Temp: 98 F (36.7 C) 98.5 F (36.9 C) 98.1 F (36.7 C) 98 F (36.7 C)  TempSrc:  Oral Oral Axillary  SpO2: 98% 99% 99%   Weight:      Height:       General: alert Lochia: appropriate Uterine Fundus: firm Incision: Dressing is clean, dry, and intact DVT Evaluation: No evidence of DVT seen on physical exam. Labs: Lab Results  Component Value Date   WBC  11.1 (H) 10/21/2016   HGB 9.0 (L) 10/21/2016   HCT 28.6 (L) 10/21/2016   MCV 75.1 (L) 10/21/2016   PLT 207 10/21/2016   CMP Latest Ref Rng & Units 09/18/2016  Glucose 65 - 99 mg/dL 87  BUN 6 - 20 mg/dL <5(L)  Creatinine 0.44 - 1.00 mg/dL 0.41(L)  Sodium 135 - 145 mmol/L 134(L)  Potassium 3.5 - 5.1 mmol/L 3.3(L)  Chloride 101 - 111 mmol/L 105  CO2 22 - 32 mmol/L 21(L)  Calcium 8.9 - 10.3 mg/dL 8.6(L)  Total Protein 6.5 - 8.1 g/dL 6.3(L)  Total Bilirubin 0.3 - 1.2 mg/dL 0.7  Alkaline Phos 38 - 126 U/L 184(H)  AST 15 - 41 U/L 19  ALT 14 - 54 U/L 8(L)    Discharge instruction: per After Visit Summary and "Baby and Me Booklet".  After Visit Meds:  Allergies as of 10/23/2016      Reactions   Other Hives, Nausea And Vomiting   Green beans   Red Dye Nausea And Vomiting  Sulfa Antibiotics Hives, Nausea And Vomiting   Childhood reaction      Medication List    TAKE these medications   amitriptyline 10 MG tablet Commonly known as:  ELAVIL Take 1 tablet (10 mg total) by mouth at bedtime.   calcium carbonate 750 MG chewable tablet Commonly known as:  TUMS EX Chew 2 tablets by mouth daily as needed for heartburn.   cyclobenzaprine 10 MG tablet Commonly known as:  FLEXERIL Take 1 tablet (10 mg total) by mouth 2 (two) times daily as needed for muscle spasms.   famotidine 20 MG tablet Commonly known as:  PEPCID Take 1 tablet (20 mg total) by mouth daily.   ferrous sulfate 325 (65 FE) MG tablet Commonly known as:  FERROUSUL Take 1 tablet (325 mg total) by mouth 2 (two) times daily.   ibuprofen 600 MG tablet Commonly known as:  ADVIL,MOTRIN Take 1 tablet (600 mg total) by mouth every 6 (six) hours.   oxyCODONE 5 MG immediate release tablet Commonly known as:  Oxy IR/ROXICODONE Take 1 tablet (5 mg total) by mouth every 4 (four) hours as needed (pain scale 4-7).   PRENATAL GUMMIES/DHA & FA 0.4-32.5 MG Chew Chew 2 tablets by mouth daily.   promethazine 25 MG  tablet Commonly known as:  PHENERGAN Take 1 tablet (25 mg total) by mouth every 6 (six) hours as needed for nausea or vomiting.   ursodiol 500 MG tablet Commonly known as:  ACTIGALL Take 1 tablet (500 mg total) by mouth 2 (two) times daily.       Diet: routine diet  Activity: Advance as tolerated. Pelvic rest for 6 weeks.   Outpatient follow up:6 weeks Follow up Appt:No future appointments. Follow up visit: No Follow-up on file.  Postpartum contraception: Tubal Ligation  Newborn Data: Live born female  Birth Weight: 7 lb 11 oz (3487 g) APGAR: 9, 9  Baby Feeding: Breast Disposition:home with mother   10/23/2016 Emily Filbert, MD

## 2016-10-23 NOTE — Progress Notes (Signed)
Pt discharged with printed instructions. Pt verbalized an understanding. No concerns noted. Eileen [name] [name], RN 

## 2016-10-26 ENCOUNTER — Encounter: Payer: Medicaid Other | Admitting: Obstetrics & Gynecology

## 2016-10-26 ENCOUNTER — Encounter (HOSPITAL_COMMUNITY): Payer: Self-pay | Admitting: Obstetrics and Gynecology

## 2016-11-01 ENCOUNTER — Telehealth (HOSPITAL_COMMUNITY): Payer: Self-pay | Admitting: Lactation Services

## 2016-11-01 ENCOUNTER — Encounter (HOSPITAL_COMMUNITY): Payer: Self-pay

## 2016-11-01 ENCOUNTER — Inpatient Hospital Stay (HOSPITAL_COMMUNITY)
Admission: AD | Admit: 2016-11-01 | Discharge: 2016-11-01 | Disposition: A | Discharge status: Home / Self Care | Payer: Medicaid Other | Source: Ambulatory Visit | Attending: Obstetrics & Gynecology | Admitting: Obstetrics & Gynecology

## 2016-11-01 DIAGNOSIS — R509 Fever, unspecified: Secondary | ICD-10-CM | POA: Insufficient documentation

## 2016-11-01 DIAGNOSIS — N61 Mastitis without abscess: Secondary | ICD-10-CM | POA: Diagnosis not present

## 2016-11-01 DIAGNOSIS — E039 Hypothyroidism, unspecified: Secondary | ICD-10-CM | POA: Insufficient documentation

## 2016-11-01 DIAGNOSIS — N189 Chronic kidney disease, unspecified: Secondary | ICD-10-CM | POA: Insufficient documentation

## 2016-11-01 DIAGNOSIS — M797 Fibromyalgia: Secondary | ICD-10-CM | POA: Diagnosis not present

## 2016-11-01 DIAGNOSIS — F329 Major depressive disorder, single episode, unspecified: Secondary | ICD-10-CM | POA: Diagnosis not present

## 2016-11-01 DIAGNOSIS — O9989 Other specified diseases and conditions complicating pregnancy, childbirth and the puerperium: Secondary | ICD-10-CM | POA: Insufficient documentation

## 2016-11-01 DIAGNOSIS — N644 Mastodynia: Secondary | ICD-10-CM | POA: Insufficient documentation

## 2016-11-01 DIAGNOSIS — I341 Nonrheumatic mitral (valve) prolapse: Secondary | ICD-10-CM | POA: Diagnosis not present

## 2016-11-01 DIAGNOSIS — M069 Rheumatoid arthritis, unspecified: Secondary | ICD-10-CM | POA: Insufficient documentation

## 2016-11-01 DIAGNOSIS — F419 Anxiety disorder, unspecified: Secondary | ICD-10-CM | POA: Insufficient documentation

## 2016-11-01 DIAGNOSIS — F17210 Nicotine dependence, cigarettes, uncomplicated: Secondary | ICD-10-CM | POA: Insufficient documentation

## 2016-11-01 DIAGNOSIS — K831 Obstruction of bile duct: Secondary | ICD-10-CM | POA: Insufficient documentation

## 2016-11-01 MED ORDER — DICLOXACILLIN SODIUM 500 MG PO CAPS: 500.0000 mg | ORAL_CAPSULE | Freq: Four times a day (QID) | ORAL | 0 refills | Status: AC

## 2016-11-01 NOTE — Lactation Note (Signed)
Called to see Mom in MAU. Mom presenting with S/S of mastitis left breast.  Going home with RX for Dicloxacillin. Baby had nursed for 10-15 minutes prior to my visit. Left breast soft but nodules present radiating to axilla. Redness noted radiating to left axilla from 1-2:00 back. Mom using cradle hold to latch baby. Assisted Mom to use cross cradle to latch for more depth and this allowed Mom to massage area of nodule while baby is nursing. Nodules beginning to soften. Plan discussed: Continue BF every 2-3 hours. Prior to latching baby, apply warm compress to outer quadrant left breast, good massage. Keep baby nursing for 15-20 minutes to soften breast. Only pump if nodules still present, apply ice packs. Be sure not to miss any feeding.  Take Antibiotic and Motrin as prescribed.  Rest, fluids. Mom has not put baby to right breast since yesterday, pumped this am at 0900 and received 30 ml. Right breast soft.  Advised Mom she needs to start BF again both breasts each feeding to protect her milk supply. If baby does not go to breast, then pump for 15 minutes to protect milk supply. Advised baby should be at breast 8-12 times or more in 24 hours.  F/U with OB on Tuesday, Call for questions/concerns.

## 2016-11-01 NOTE — MAU Provider Note (Signed)
History     CSN: [id number]  Arrival date and time: 11/01/16 1211   First Provider Initiated Contact with Patient 11/01/16 1241      Chief Complaint  Patient presents with  . Breast Pain  . Fever   HPI Patient Eileen [name] is a 26 year old [phone] at 2 weeks postpartum here with complaints of left breast tenderness, swelling, chills, and overall malaise. Patient states that she felt feverish at home but did not have a fever. States that her left breast is red and swollen; she has been trying to nurse the baby to relieve the pressure but everytime she empties her breast, it fills back up again.   OB History as of 10/22/16    Gravida Para Term Preterm AB Living   6 4 4  0 2 4   SAB TAB Ectopic Multiple Live Births     2 0 0 4      Past Medical History:  Diagnosis Date  . Abortion 08/04/2011  . Anemia   . Anxiety   . Cholestasis during pregnancy   . Chronic kidney disease    kidney infection.  . Depression   . Dysuria   . Fibromyalgia   . Headache(784.0)   . Hypothyroid    history of.  . Mitral valve prolapse   . RA (rheumatoid arthritis) ([place])     Past Surgical History:  Procedure Laterality Date  . DILATION AND CURETTAGE OF UTERUS    . LAPAROSCOPY N/A 05/25/2014   Procedure: LAPAROSCOPY DIAGNOSTIC;  Surgeon: Osborne Oman, MD;  Location: [facility];  Service: Gynecology;  Laterality: N/A;  . THERAPEUTIC ABORTION N/A 2012  . TUBAL LIGATION Bilateral 10/22/2016   Procedure: POST PARTUM TUBAL LIGATION;  Surgeon: Mora Bellman, MD;  Location: [facility];  Service: Gynecology;  Laterality: Bilateral;    Family History  Problem Relation Age of Onset  . Cancer Mother     SKIN  . Hyperlipidemia Mother   . Thyroid disease Mother   . Heart disease Mother   . Mitral valve prolapse Mother   . Heart attack Mother   . Asthma Brother   . Diabetes Maternal Grandmother   . Hyperlipidemia Maternal Grandmother   . Emphysema Paternal Grandfather     Social History  Substance  Use Topics  . Smoking status: Current Every Day Smoker    Packs/day: 0.50    Types: Cigarettes  . Smokeless tobacco: Current User  . Alcohol use 0.0 oz/week     Comment: occasional but not while pregnant    Allergies:  Allergies  Allergen Reactions  . Food Hives, Nausea And Vomiting and Other (See Comments)    Pt is allergic to green beans.   . Red Dye Nausea And Vomiting  . Sulfa Antibiotics Hives and Nausea And Vomiting    No prescriptions prior to admission.    Review of Systems  Constitutional: Positive for chills, diaphoresis, fatigue and fever.   Physical Exam   Blood pressure (!) 104/58, pulse (!) 132, temperature 99 F (37.2 C), temperature source Oral, resp. rate 18, last menstrual period 02/05/2016, unknown if currently breastfeeding.  Physical Exam  Constitutional: She is oriented to person, place, and time. She appears well-developed.  HENT:  Head: Normocephalic.  Neck: Normal range of motion.  Respiratory: Effort normal.  Erythematous area extending from 1-2 oclock towards the axilla; hot to the touch.   Neurological: She is alert and oriented to person, place, and time.  Skin: Skin is warm.  MAU Course  Procedures  MDM -lacation at the bedside to assess latch; see lactation note for further information.   Assessment and Plan   1. Acute mastitis of left breast   2. Lactation consultant recommends BF 2-3 hours; heat and massage.  3. RX sent for dicloxicillin; ob fup on 11-03-2016 4. Patient verbalized understanding    Starr Lake CNM 11/01/2016, 2:54 PM

## 2016-11-01 NOTE — MAU Note (Signed)
Called Lactation to see patient in MAU

## 2016-11-01 NOTE — MAU Note (Signed)
Lactation at bedside. °

## 2016-11-01 NOTE — Discharge Instructions (Signed)
Breastfeeding and Mastitis  Mastitis is inflammation of the breast tissue. It can occur in women who are breastfeeding. This can make breastfeeding painful. Mastitis will sometimes go away on its own, especially if it is not caused by an infection (non-infectious mastitis). Your health care provider will help determine if medical treatment is needed. Treatment may be needed if the condition is caused by a bacterial infection (infectious mastitis).  What are the causes?  This condition is often associated with a blocked milkduct, which can happen when too much milk builds up in the breast. Causes of excess milk in the breast can include:  · Poor latch-on. If your baby is not latched onto the breast properly, he or she may not empty your breast completely while breastfeeding.  · Allowing too much time to pass between feedings.  · Wearing a bra or other clothing that is too tight. This puts extra pressure on the milk ducts so milk does not flow through them as it should.  · Milk remaining in the breast because it is overfilled (engorged).  · Stress and fatigue.    Mastitis can also be caused by a bacterial infection. Bacteria may enter the breast tissue through cuts, cracks, or openings in the skin near the nipple area. Cracks in the skin are often caused when your baby does not latch on properly to the breast.  What are the signs or symptoms?  Symptoms of this condition include:  · Swelling, redness, tenderness, and pain in an area of the breast. This usually affects the upper part of the breast, toward the armpit region. In most cases, it affects only one breast. In some cases, it may occur on both breasts at the same time and affect a larger portion of breast tissue.  · Swelling of the glands under the arm on the same side.  · Fatigue, headache, and flu-like muscle aches.  · Fever.  · Rapid pulse.    Symptoms usually last 2 to 5 days. Breast pain and redness are at their worst on day 2 and day 3, and they usually go  away by day 5. If an infection is left to progress, a collection of pus (abscess) may develop.  How is this diagnosed?  This condition can be diagnosed based on your symptoms and a physical exam. You may also have tests, such as:  · Blood tests to determine if your body is fighting a bacterial infection.  · Mammogram or ultrasound tests to rule out other problems or diseases.  · Fluid tests. If an abscess has developed, the fluid in the abscess may be removed with a needle. The fluid may be analyzed to determine if bacteria are present.  · Breast milk may be cultured and tested for bacteria.    How is this treated?  This condition will sometimes go away on its own. Your health care provider may choose to wait 24 hours after first seeing you to decide whether treatment is needed. If treatment is needed, it may include:  · Strategies to manage breastfeeding. This includes continuing to breastfeed or pump in order to allow adequate milk flow, using breast massage, and applying heat or cold to the affected area.  · Self-care such as rest and increased fluid intake.  · Medicine for pain.  · Antibiotic medicine to treat a bacterial infection. This is usually taken by mouth.  · If an abscess has developed, it may be treated by removing fluid with a needle.      Follow these instructions at home:  Medicines  · Take over-the-counter and prescription medicines only as told by your health care provider.  · If you were prescribed an antibiotic medicine, take it as told by your health care provider. Do not stop taking the antibiotic even if you start to feel better.  General instructions  · Do not wear a tight or underwire bra. Wear a soft, supportive bra.  · Increase your fluid intake, especially if you have a fever.  · Get plenty of rest.  For breastfeeding:  · Continue to empty your breasts as often as possible, either by breastfeeding or using an electric breast pump. This will lower the pressure and the pain that comes with  it. Ask your health care provider if changes need to be made to your breastfeeding or pumping routine.  · Keep your nipples clean and dry.  · During breastfeeding, empty the first breast completely before going to the other breast. If your baby is not emptying your breasts completely, use a breast pump to empty your breasts.  · Use breast massage during feeding or pumping sessions.  · If directed, apply moist heat to the affected area of your breast right before breastfeeding or pumping. Use the heat source that your health care provider recommends.  · If directed, put ice on the affected area of your breast right after breastfeeding or pumping:  ? Put ice in a plastic bag.  ? Place a towel between your skin and the bag.  ? Leave the ice on for 20 minutes.  · If you go back to work, pump your breasts while at work to stay in time with your nursing schedule.  · Do not allow your breasts to become engorged.  Contact a health care provider if:  · You have pus-like discharge from the breast.  · You have a fever.  · Your symptoms do not improve within 2 days of starting treatment.  · Your symptoms return after you have recovered from a breast infection.  Get help right away if:  · Your pain and swelling are getting worse.  · You have pain that is not controlled with medicine.  · You have a red line extending from the breast toward your armpit.  Summary  · Mastitis is inflammation of the breast tissue. It is often caused by a blocked milk duct or bacteria.  · This condition may be treated with hot and cold compresses, medicines, self-care, and certain breastfeeding strategies.  · If you were prescribed an antibiotic medicine, take it as told by your health care provider. Do not stop taking the antibiotic even if you start to feel better.  · Continue to empty your breasts as often as possible either by breastfeeding or using an electric breast pump.  This information is not intended to replace advice given to you by your  health care provider. Make sure you discuss any questions you have with your health care provider.  Document Released: 11/07/2004 Document Revised: 07/14/2016 Document Reviewed: 07/14/2016  Elsevier Interactive Patient Education © 2017 [employer]

## 2016-11-01 NOTE — Telephone Encounter (Signed)
Mom reports starting last night, left breast engorged, not releasing milk well, had fever up to 102 and body aches. Called OB and they plan to see her on Tuesday. Did not call RX in for Mom. Since that time Mom has been using warm compresses on breast, working on latching baby, pumping and using ice packs. Today fever down to 99.9 but still has some body aches. Breasts feeling full all the time. LC advised Mom to continue to apply warm compresses prior to BF, good massage, hand expression. Get baby to latch to help empty breast, post pump as needed, apply ice.  Take Motrin as prescribed at D/C. Apply cabbage leaves 1-2 time per day. Also advised Mom to come to MAU to be seen for evaluation in the event she needs antibiotic treatment for mastitis.

## 2016-11-01 NOTE — MAU Note (Addendum)
Pt states her L breast is engorged, temp up to 102.9 during the night, has body aches. Has been alternating tylenol & ibuprofen, last took tylenol 4 hours ago.  Vag delivery on March 28.  Pt states her baby is frequently sleepy, not nursing as much as pt thinks he should.

## 2016-11-02 ENCOUNTER — Encounter: Payer: Medicaid Other | Admitting: Obstetrics and Gynecology

## 2016-11-09 ENCOUNTER — Encounter: Payer: Medicaid Other | Admitting: Obstetrics and Gynecology

## 2017-01-13 ENCOUNTER — Ambulatory Visit (HOSPITAL_COMMUNITY)
Admission: EM | Admit: 2017-01-13 | Discharge: 2017-01-13 | Disposition: A | Discharge status: Home / Self Care | Payer: Medicaid Other | Attending: Family Medicine | Admitting: Family Medicine

## 2017-01-13 ENCOUNTER — Encounter (HOSPITAL_COMMUNITY): Payer: Self-pay | Admitting: Family Medicine

## 2017-01-13 DIAGNOSIS — S61411A Laceration without foreign body of right hand, initial encounter: Secondary | ICD-10-CM | POA: Diagnosis not present

## 2017-01-13 DIAGNOSIS — W25XXXA Contact with sharp glass, initial encounter: Secondary | ICD-10-CM | POA: Diagnosis not present

## 2017-01-13 MED ORDER — LIDOCAINE-EPINEPHRINE (PF) 2 %-1:200000 IJ SOLN
INTRAMUSCULAR | Status: AC
  Filled 2017-01-13: qty 20

## 2017-01-13 NOTE — ED Provider Notes (Signed)
[place]    CSN: [id number] Arrival date & time: 01/13/17  1228     History   Chief Complaint Chief Complaint  Patient presents with  . Laceration    HPI Eileen [name] is a 26 y.o. female.   26 year old woman punched a mirror last night when she got angry and cut the middle MCP joint of her right hand. She's up-to-date on her tetanus shots.      Past Medical History:  Diagnosis Date  . Abortion 08/04/2011  . Anemia   . Anxiety   . Cholestasis during pregnancy   . Chronic kidney disease    kidney infection.  . Depression   . Dysuria   . Fibromyalgia   . Headache(784.0)   . Hypothyroid    history of.  . Mitral valve prolapse   . RA (rheumatoid arthritis) [facility])     Patient Active Problem List   Diagnosis Date Noted  . Encounter for sterilization   . Cholestasis during pregnancy in third trimester 10/21/2016  . Cholestasis during pregnancy, antepartum 09/30/2016  . Anemia affecting pregnancy 08/04/2016  . Back pain affecting pregnancy 08/04/2016  . Tachycardia 06/16/2016  . Supervision of normal pregnancy, antepartum 06/09/2016  . Rheumatoid arthritis ([place]) 04/07/2016  . Tobacco abuse 04/07/2016  . Depression with anxiety 11/01/2013  . Anxiety 08/01/2013  . Goiter, unspecified 06/06/2007    Past Surgical History:  Procedure Laterality Date  . DILATION AND CURETTAGE OF UTERUS    . LAPAROSCOPY N/A 05/25/2014   Procedure: LAPAROSCOPY DIAGNOSTIC;  Surgeon: Osborne Oman, MD;  Location: [facility];  Service: Gynecology;  Laterality: N/A;  . THERAPEUTIC ABORTION N/A 2012  . TUBAL LIGATION Bilateral 10/22/2016   Procedure: POST PARTUM TUBAL LIGATION;  Surgeon: Mora Bellman, MD;  Location: [facility];  Service: Gynecology;  Laterality: Bilateral;    OB History    Gravida Para Term Preterm AB Living   6 4 4  0 2 4   SAB TAB Ectopic Multiple Live Births     2 0 0 4       Home Medications    Prior to Admission medications   Medication Sig Start  Date End Date Taking? Authorizing Provider  ALPRAZolam Duanne Moron) 0.5 MG tablet Take 0.5 mg by mouth at bedtime as needed for anxiety.   Yes [provider]  DULoxetine (CYMBALTA) 30 MG capsule Take 30 mg by mouth daily.   Yes [provider]  lurasidone (LATUDA) 20 MG TABS tablet Take by mouth.   Yes [provider]  acetaminophen (TYLENOL) 325 MG tablet Take 650 mg by mouth every 6 (six) hours as needed for mild pain, moderate pain, fever or headache.    [provider]    Family History Family History  Problem Relation Age of Onset  . Cancer Mother        SKIN  . Hyperlipidemia Mother   . Thyroid disease Mother   . Heart disease Mother   . Mitral valve prolapse Mother   . Heart attack Mother   . Asthma Brother   . Diabetes Maternal Grandmother   . Hyperlipidemia Maternal Grandmother   . Emphysema Paternal Grandfather     Social History Social History  Substance Use Topics  . Smoking status: Current Every Day Smoker    Packs/day: 0.50    Types: Cigarettes  . Smokeless tobacco: Current User  . Alcohol use 0.0 oz/week     Comment: occasional but not while pregnant     Allergies  Food; Red dye; and Sulfa antibiotics   Review of Systems Review of Systems  Skin: Positive for wound.  All other systems reviewed and are negative.    Physical Exam Triage Vital Signs ED Triage Vitals  Enc Vitals Group     BP 01/13/17 1244 107/72     Pulse Rate 01/13/17 1244 96     Resp 01/13/17 1244 16     Temp 01/13/17 1244 98.2 F (36.8 C)     Temp Source 01/13/17 1244 Oral     SpO2 01/13/17 1244 100 %     Weight --      Height --      Head Circumference --      Peak Flow --      Pain Score 01/13/17 1242 5     Pain Loc --      Pain Edu? --      Excl. in [place]? --    No data found.   Updated Vital Signs BP 107/72 (BP Location: Left Arm)   Pulse 96   Temp 98.2 F (36.8 C) (Oral)   Resp 16   LMP 12/30/2016   SpO2 100%      Physical  Exam  Constitutional: She is oriented to person, place, and time. She appears well-developed and well-nourished. No distress.  HENT:  Right Ear: External ear normal.  Left Ear: External ear normal.  Mouth/Throat: Oropharynx is clear and moist.  Eyes: Conjunctivae are normal. Pupils are equal, round, and reactive to light.  Neck: Normal range of motion. Neck supple.  Pulmonary/Chest: Effort normal.  Musculoskeletal: Normal range of motion.  Neurological: She is alert and oriented to person, place, and time.  Skin: Skin is warm and dry.  Psychiatric: She has a normal mood and affect.  Nursing note and vitals reviewed.    UC Treatments / Results  Labs (all labs ordered are listed, but only abnormal results are displayed) Labs Reviewed - No data to display  EKG  EKG Interpretation None       Radiology No results found.  Procedures .Marland KitchenLaceration Repair Date/Time: 01/13/2017 1:06 PM Performed by: Robyn Haber Authorized by: Robyn Haber   Consent:    Consent obtained:  Verbal   Consent given by:  Patient   Risks discussed:  Poor cosmetic result   Alternatives discussed:  No treatment Anesthesia (see MAR for exact dosages):    Anesthesia method:  Local infiltration   Local anesthetic:  Lidocaine 2% WITH epi Laceration details:    Location:  Hand   Hand location:  R hand, dorsum   Length (cm):  1.1   Depth (mm):  5 Repair type:    Repair type:  Simple Pre-procedure details:    Preparation:  Patient was prepped and draped in usual sterile fashion Treatment:    Area cleansed with:  Betadine and soap and water   Amount of cleaning:  Standard   Irrigation solution:  Tap water   Visualized foreign bodies/material removed: no   Skin repair:    Repair method:  Sutures   Suture size:  4-0   Suture technique:  Horizontal mattress   Number of sutures:  3 Approximation:    Approximation:  Close   Vermilion border: well-aligned   Post-procedure details:     Dressing:  Adhesive bandage   Patient tolerance of procedure:  Tolerated well, no immediate complications   (including critical care time)  Medications Ordered in UC Medications - No data to display   Initial Impression /  Assessment and Plan / UC Course  I have reviewed the triage vital signs and the nursing notes.  Pertinent labs & imaging results that were available during my care of the patient were reviewed by me and considered in my medical decision making (see chart for details).     Final Clinical Impressions(s) / UC Diagnoses   Final diagnoses:  Laceration of right hand, foreign body presence unspecified, initial encounter    New Prescriptions Current Discharge Medication List       Robyn Haber, MD 01/13/17 1309

## 2017-01-13 NOTE — ED Triage Notes (Addendum)
Punched a mirror last night and now has a laceration over a knuckle,right, middle finger at hand.  Bleeding controlled.  Able to move finger and no numbness

## 2017-02-05 NOTE — Anesthesia Postprocedure Evaluation (Signed)
Anesthesia Post Note  Patient: Eileen [name]  Procedure(s) Performed: Procedure(s) (LRB): POST PARTUM TUBAL LIGATION (Bilateral)     Anesthesia Post Evaluation  Last Vitals:  Vitals:   10/22/16 1726 10/23/16 0415  BP: 109/72 108/73  Pulse: 90 83  Resp: 16 18  Temp: 36.7 C 36.7 C    Last Pain:  Vitals:   10/23/16 0800  TempSrc:   PainSc: 7                  Nada Godley EDWARD

## 2017-02-05 NOTE — Addendum Note (Signed)
Addendum  created 02/05/17 1324 by Lyndle Herrlich, MD   Sign clinical note

## 2017-02-26 ENCOUNTER — Emergency Department (HOSPITAL_COMMUNITY): Payer: Medicaid Other

## 2017-02-26 ENCOUNTER — Encounter (HOSPITAL_COMMUNITY): Payer: Self-pay

## 2017-02-26 ENCOUNTER — Emergency Department (HOSPITAL_COMMUNITY)
Admission: EM | Admit: 2017-02-26 | Discharge: 2017-02-26 | Disposition: A | Discharge status: Home / Self Care | Payer: Medicaid Other | Attending: Emergency Medicine | Admitting: Emergency Medicine

## 2017-02-26 DIAGNOSIS — N189 Chronic kidney disease, unspecified: Secondary | ICD-10-CM | POA: Diagnosis not present

## 2017-02-26 DIAGNOSIS — F17210 Nicotine dependence, cigarettes, uncomplicated: Secondary | ICD-10-CM | POA: Diagnosis not present

## 2017-02-26 DIAGNOSIS — R102 Pelvic and perineal pain: Secondary | ICD-10-CM

## 2017-02-26 DIAGNOSIS — N760 Acute vaginitis: Secondary | ICD-10-CM | POA: Insufficient documentation

## 2017-02-26 DIAGNOSIS — D649 Anemia, unspecified: Secondary | ICD-10-CM | POA: Insufficient documentation

## 2017-02-26 DIAGNOSIS — O26892 Other specified pregnancy related conditions, second trimester: Secondary | ICD-10-CM | POA: Insufficient documentation

## 2017-02-26 DIAGNOSIS — Z79899 Other long term (current) drug therapy: Secondary | ICD-10-CM | POA: Insufficient documentation

## 2017-02-26 DIAGNOSIS — B9689 Other specified bacterial agents as the cause of diseases classified elsewhere: Secondary | ICD-10-CM | POA: Insufficient documentation

## 2017-02-26 LAB — COMPREHENSIVE METABOLIC PANEL
ALK PHOS: 72 U/L (ref 38–126)
ALT: 20 U/L (ref 14–54)
AST: 27 U/L (ref 15–41)
Albumin: 4.5 g/dL (ref 3.5–5.0)
Anion gap: 7 (ref 5–15)
BILIRUBIN TOTAL: 1 mg/dL (ref 0.3–1.2)
BUN: 8 mg/dL (ref 6–20)
CALCIUM: 9.7 mg/dL (ref 8.9–10.3)
CHLORIDE: 106 mmol/L (ref 101–111)
CO2: 24 mmol/L (ref 22–32)
CREATININE: 0.79 mg/dL (ref 0.44–1.00)
Glucose, Bld: 146 mg/dL — ABNORMAL HIGH (ref 65–99)
Potassium: 3.8 mmol/L (ref 3.5–5.1)
Sodium: 137 mmol/L (ref 135–145)
TOTAL PROTEIN: 7.4 g/dL (ref 6.5–8.1)

## 2017-02-26 LAB — CBC
HCT: 34.4 % — ABNORMAL LOW (ref 36.0–46.0)
Hemoglobin: 10.4 g/dL — ABNORMAL LOW (ref 12.0–15.0)
MCH: 22.3 pg — ABNORMAL LOW (ref 26.0–34.0)
MCHC: 30.2 g/dL (ref 30.0–36.0)
MCV: 73.8 fL — ABNORMAL LOW (ref 78.0–100.0)
PLATELETS: 221 10*3/uL (ref 150–400)
RBC: 4.66 MIL/uL (ref 3.87–5.11)
RDW: 16.9 % — ABNORMAL HIGH (ref 11.5–15.5)
WBC: 6.5 10*3/uL (ref 4.0–10.5)

## 2017-02-26 LAB — I-STAT BETA HCG BLOOD, ED (MC, WL, AP ONLY)

## 2017-02-26 LAB — URINALYSIS, ROUTINE W REFLEX MICROSCOPIC
Bilirubin Urine: NEGATIVE
Glucose, UA: NEGATIVE mg/dL
Hgb urine dipstick: NEGATIVE
KETONES UR: NEGATIVE mg/dL
LEUKOCYTES UA: NEGATIVE
NITRITE: NEGATIVE
Protein, ur: NEGATIVE mg/dL
Specific Gravity, Urine: 1.003 — ABNORMAL LOW (ref 1.005–1.030)
pH: 7 (ref 5.0–8.0)

## 2017-02-26 LAB — WET PREP, GENITAL
Sperm: NONE SEEN
TRICH WET PREP: NONE SEEN
YEAST WET PREP: NONE SEEN

## 2017-02-26 LAB — LIPASE, BLOOD: Lipase: 30 U/L (ref 11–51)

## 2017-02-26 MED ORDER — ONDANSETRON HCL 4 MG/2ML IJ SOLN
4.0000 mg | Freq: Once | INTRAMUSCULAR | Status: AC
  Administered 2017-02-26: 4 mg via INTRAVENOUS
  Filled 2017-02-26: qty 2

## 2017-02-26 MED ORDER — IOPAMIDOL (ISOVUE-300) INJECTION 61%
INTRAVENOUS | Status: AC
Start: 2017-02-26 — End: 2017-02-26
  Administered 2017-02-26: 100 mL
  Filled 2017-02-26: qty 100

## 2017-02-26 MED ORDER — STERILE WATER FOR INJECTION IJ SOLN
INTRAMUSCULAR | Status: AC
  Filled 2017-02-26: qty 10

## 2017-02-26 MED ORDER — CEFTRIAXONE SODIUM 250 MG IJ SOLR
250.0000 mg | Freq: Once | INTRAMUSCULAR | Status: AC
  Administered 2017-02-26: 250 mg via INTRAMUSCULAR
  Filled 2017-02-26: qty 250

## 2017-02-26 MED ORDER — DOXYCYCLINE HYCLATE 100 MG PO CAPS: 100.0000 mg | ORAL_CAPSULE | Freq: Two times a day (BID) | ORAL | 0 refills | Status: AC

## 2017-02-26 MED ORDER — FENTANYL CITRATE (PF) 100 MCG/2ML IJ SOLN
50.0000 ug | Freq: Once | INTRAMUSCULAR | Status: AC
  Administered 2017-02-26: 50 ug via INTRAVENOUS
  Filled 2017-02-26: qty 2

## 2017-02-26 MED ORDER — AZITHROMYCIN 1 G PO PACK
1.0000 g | PACK | Freq: Once | ORAL | Status: AC
  Administered 2017-02-26: 1 g via ORAL
  Filled 2017-02-26: qty 1

## 2017-02-26 MED ORDER — SODIUM CHLORIDE 0.9 % IV BOLUS (SEPSIS)
1000.0000 mL | Freq: Once | INTRAVENOUS | Status: AC
  Administered 2017-02-26: 1000 mL via INTRAVENOUS

## 2017-02-26 MED ORDER — METRONIDAZOLE 500 MG PO TABS: 500.0000 mg | ORAL_TABLET | Freq: Two times a day (BID) | ORAL | 0 refills | Status: AC

## 2017-02-26 NOTE — ED Notes (Signed)
Pt refused medications at current time. Wanted to wait for wet prep and Provider before making decision on meds

## 2017-02-26 NOTE — ED Notes (Signed)
Pt taken to CT.

## 2017-02-26 NOTE — ED Notes (Signed)
Pt requested IV out and monitoring equipment removed

## 2017-02-26 NOTE — ED Triage Notes (Signed)
P tc/o RLQ ABD pain that radiates to back since yesterday. Pt endorses nausea, but denies vomiting. Pt sent from UC to be evaluated.

## 2017-02-26 NOTE — ED Notes (Signed)
Pt understood dc material. NAD noted. Scripts given at dc 

## 2017-02-26 NOTE — ED Notes (Signed)
Pelvic set up and at bedside

## 2017-02-26 NOTE — Discharge Instructions (Signed)
As discussed, please take your entire course of antibiotics even if she felt better. Drink plenty of fluids and stay well-hydrated keeping your urine clear.  Avoid sexual intercourse for at least one week. You will be contacted if any results come back positive. Follow-up with your primary care provider next week.

## 2017-02-26 NOTE — ED Provider Notes (Signed)
[facility] DEPT Provider Note   CSN: [id number] Arrival date & time: 02/26/17  1538     History   Chief Complaint Chief Complaint  Patient presents with  . Abdominal Pain    HPI Eileen [name] is a 26 y.o. female [phone] presenting with 24 hours of perilumbar likely pain moving towards the right lower quadrant. She reports that initially started with get better took Tylenol which helped temporarily. She woke up this morning with intense pain that has been constant since and worsening. She was seen at urgent care and was sent here for further evaluation. She reports nausea but no vomiting, denies diarrhea, abnormal vaginal discharge, fever, chills or other symptoms. LMP was 02/09/17. Past abdominal surgeries include tubal ligation 10/23/16. She states everything went well and hasn't had any complications since. Last bowel movement yesterday and normal. Denies dysuria, hematuria, vaginal discharge.  HPI  Past Medical History:  Diagnosis Date  . Abortion 08/04/2011  . Anemia   . Anxiety   . Cholestasis during pregnancy   . Chronic kidney disease    kidney infection.  . Depression   . Dysuria   . Fibromyalgia   . Headache(784.0)   . Hypothyroid    history of.  . Mitral valve prolapse   . RA (rheumatoid arthritis) [facility])     Patient Active Problem List   Diagnosis Date Noted  . Encounter for sterilization   . Cholestasis during pregnancy in third trimester 10/21/2016  . Cholestasis during pregnancy, antepartum 09/30/2016  . Anemia affecting pregnancy 08/04/2016  . Back pain affecting pregnancy 08/04/2016  . Tachycardia 06/16/2016  . Supervision of normal pregnancy, antepartum 06/09/2016  . Rheumatoid arthritis ([place]) 04/07/2016  . Tobacco abuse 04/07/2016  . Depression with anxiety 11/01/2013  . Anxiety 08/01/2013  . Goiter, unspecified 06/06/2007    Past Surgical History:  Procedure Laterality Date  . DILATION AND CURETTAGE OF UTERUS    . LAPAROSCOPY N/A 05/25/2014   Procedure: LAPAROSCOPY DIAGNOSTIC;  Surgeon: Osborne Oman, MD;  Location: [facility];  Service: Gynecology;  Laterality: N/A;  . THERAPEUTIC ABORTION N/A 2012  . TUBAL LIGATION Bilateral 10/22/2016   Procedure: POST PARTUM TUBAL LIGATION;  Surgeon: Mora Bellman, MD;  Location: [facility];  Service: Gynecology;  Laterality: Bilateral;    OB History    Gravida Para Term Preterm AB Living   6 4 4  0 2 4   SAB TAB Ectopic Multiple Live Births     2 0 0 4       Home Medications    Prior to Admission medications   Medication Sig Start Date End Date Taking? Authorizing Provider  Acetaminophen (TYLENOL PO) Take 2 tablets by mouth daily as needed (pain).   Yes [provider]  ALPRAZolam Duanne Moron) 0.5 MG tablet Take 0.5 mg by mouth 2 (two) times daily as needed for anxiety.    Yes [provider]  DULoxetine (CYMBALTA) 30 MG capsule Take 30 mg by mouth daily.   Yes [provider]  doxycycline (VIBRAMYCIN) 100 MG capsule Take 1 capsule (100 mg total) by mouth 2 (two) times daily. 02/26/17 03/12/17  Avie Echevaria B, PA-C  metroNIDAZOLE (FLAGYL) 500 MG tablet Take 1 tablet (500 mg total) by mouth 2 (two) times daily. 02/26/17 03/05/17  Emeline General, PA-C    Family History Family History  Problem Relation Age of Onset  . Cancer Mother        SKIN  . Hyperlipidemia Mother   . Thyroid disease Mother   .  Heart disease Mother   . Mitral valve prolapse Mother   . Heart attack Mother   . Asthma Brother   . Diabetes Maternal Grandmother   . Hyperlipidemia Maternal Grandmother   . Emphysema Paternal Grandfather     Social History Social History  Substance Use Topics  . Smoking status: Current Every Day Smoker    Packs/day: 0.50    Types: Cigarettes  . Smokeless tobacco: Current User  . Alcohol use 0.0 oz/week     Comment: occasional but not while pregnant     Allergies   Eggs or egg-derived products; Food; Red dye; and Sulfa antibiotics   Review of  Systems Review of Systems  Constitutional: Negative for chills and fever.  Eyes: Negative for pain and visual disturbance.  Respiratory: Negative for cough, shortness of breath, wheezing and stridor.   Cardiovascular: Negative for chest pain and palpitations.  Gastrointestinal: Positive for abdominal pain and nausea. Negative for abdominal distention, anal bleeding, blood in stool, constipation, diarrhea and vomiting.  Genitourinary: Negative for difficulty urinating, dysuria, frequency, hematuria, pelvic pain, urgency, vaginal bleeding, vaginal discharge and vaginal pain.  Musculoskeletal: Negative for arthralgias, back pain, myalgias, neck pain and neck stiffness.  Skin: Negative for color change, pallor and rash.  Neurological: Negative for dizziness, seizures, syncope, weakness and light-headedness.     Physical Exam Updated Vital Signs BP 105/70   Pulse 72   Temp 98 F (36.7 C) (Oral)   Resp 16   Ht 4\' 11"  (1.499 m)   Wt 46.3 kg (102 lb)   SpO2 99%   BMI 20.60 kg/m   Physical Exam  Constitutional: She appears well-developed and well-nourished. No distress.  Patient is afebrile, nontoxic-appearing, lying in bed in mild discomfort no acute distress.  HENT:  Head: Normocephalic and atraumatic.  Eyes: EOM are normal.  Neck: Normal range of motion.  Cardiovascular: Normal rate, regular rhythm and normal heart sounds.   No murmur heard. Pulmonary/Chest: Effort normal. No respiratory distress. She has no wheezes. She has no rales.  Abdominal: Soft. Bowel sounds are normal. She exhibits no distension and no mass. There is tenderness. There is guarding. There is no rebound.  Flat contour, active bowel sounds. abdomen is supple and tender to palpation of the periumbilical area and right lower quadrant at McBurney's point. Palpation of the rest of the abdomen seems to exacerbate the pain felt around the umbilicus and right lower quadrant. No palpated masses.  No costovertebral angle  tenderness. Negative murphy's sign Positive McBurney's point tenderness    Genitourinary:  Genitourinary Comments: Copious milky vaginal discharge. Cervical motion tenderness. Right adnexal tenderness on palpation.  Musculoskeletal: Normal range of motion. She exhibits no edema, tenderness or deformity.  Neurological: She is alert.  Skin: Skin is warm and dry. No rash noted. She is not diaphoretic. No erythema. No pallor.  Psychiatric: She has a normal mood and affect.  Nursing note and vitals reviewed.    ED Treatments / Results  Labs (all labs ordered are listed, but only abnormal results are displayed) Labs Reviewed  WET PREP, GENITAL - Abnormal; Notable for the following:       Result Value   Clue Cells Wet Prep HPF POC PRESENT (*)    WBC, Wet Prep HPF POC TOO NUMEROUS TO COUNT (*)    All other components within normal limits  COMPREHENSIVE METABOLIC PANEL - Abnormal; Notable for the following:    Glucose, Bld 146 (*)    All other components within normal limits  CBC - Abnormal; Notable for the following:    Hemoglobin 10.4 (*)    HCT 34.4 (*)    MCV 73.8 (*)    MCH 22.3 (*)    RDW 16.9 (*)    All other components within normal limits  URINALYSIS, ROUTINE W REFLEX MICROSCOPIC - Abnormal; Notable for the following:    Color, Urine STRAW (*)    Specific Gravity, Urine 1.003 (*)    All other components within normal limits  LIPASE, BLOOD  RPR  HIV ANTIBODY (ROUTINE TESTING)  I-STAT BETA HCG BLOOD, ED ([facility], [facility], [facility] ONLY)  GC/CHLAMYDIA PROBE AMP ([place]) NOT AT [facility]   Hemoglobin  Date Value Ref Range Status  02/26/2017 10.4 (L) 12.0 - 15.0 g/dL Final  10/21/2016 9.0 (L) 12.0 - 15.0 g/dL Final  09/28/2016 8.5 (L) 11.1 - 15.9 g/dL Final  09/18/2016 6.8 (LL) 12.0 - 15.0 g/dL Final    Comment:    REPEATED TO VERIFY CRITICAL RESULT CALLED TO, READ BACK BY AND VERIFIED WITH: STANLEY,B. RN AT 1922 ON 2.23.18 BY JPEEBLES   08/25/2016 7.5 (L) 11.1 - 15.9 g/dL Final    07/08/2016 8.7 (L) 11.7 - 15.5 g/dL Final  04/07/2016 11.5 11.1 - 15.9 g/dL Final   HGB  Date Value Ref Range Status  11/04/2014 12.2 12.0 - 16.0 g/dL Final    EKG  EKG Interpretation None       Radiology Ct Abdomen Pelvis W Contrast  Result Date: 02/26/2017 CLINICAL DATA:  26 y/o F; right lower quadrant abdominal pain and nausea. EXAM: CT ABDOMEN AND PELVIS WITH CONTRAST TECHNIQUE: Multidetector CT imaging of the abdomen and pelvis was performed using the standard protocol following bolus administration of intravenous contrast. CONTRAST:  198mL ISOVUE-300 IOPAMIDOL (ISOVUE-300) INJECTION 61% COMPARISON:  03/13/2010 CT abdomen and pelvis FINDINGS: Lower chest: No acute abnormality. Hepatobiliary: 9 mm hyperattenuating focus in segment 8 of the liver (series 3, image 15) probably representing a flash hemangioma. No other focal liver lesion identified. Normal gallbladder. No intra or extrahepatic biliary ductal dilatation. Pancreas: Unremarkable. No pancreatic ductal dilatation or surrounding inflammatory changes. Spleen: Normal in size without focal abnormality. Adrenals/Urinary Tract: Adrenal glands are unremarkable. Kidneys are normal, without renal calculi, focal lesion, or hydronephrosis. Bladder wall thickening. Stomach/Bowel: Normal stomach. No bowel obstruction. No appreciable inflammatory changes of bowel. No appendix. Vascular/Lymphatic: No significant vascular findings are present. No enlarged abdominal or pelvic lymph nodes. Reproductive: There is mild fat stranding within fat of the lower abdomen anterior to the bladder (series 3, image 71) and there is a small volume of pelvic fluid. Heterogeneous enhancement of the uterus. 16 mm right ovarian cysts with a slightly irregular-shaped. Other: No abdominal wall hernia. Musculoskeletal: No acute or significant osseous findings. IMPRESSION: 1. No obstructive or inflammatory changes of bowel identified. Normal appendix. 2. Small volume of  fluid in the pelvis and mild edema within the lower abdominal fat indicates an underlying infectious or inflammatory process. Irregular shaped cyst in the right ovary, question ruptured ovarian cyst as etiology. 3. Subcentimeter hyperdense focus in segment 8 of the liver, probably a hemangioma. 4. Mild bladder wall thickening, correlation with urinalysis for possible cystitis is recommended. Electronically Signed   By: Kristine Garbe M.D.   On: 02/26/2017 18:19    Procedures Procedures (including critical care time)  Medications Ordered in ED Medications  sterile water (preservative free) injection (not administered)  sodium chloride 0.9 % bolus 1,000 mL (0 mLs Intravenous Stopped 02/26/17 2116)  ondansetron (ZOFRAN) injection  4 mg (4 mg Intravenous Given 02/26/17 1738)  fentaNYL (SUBLIMAZE) injection 50 mcg (50 mcg Intravenous Given 02/26/17 1739)  iopamidol (ISOVUE-300) 61 % injection (100 mLs  Contrast Given 02/26/17 1758)  cefTRIAXone (ROCEPHIN) injection 250 mg (250 mg Intramuscular Given 02/26/17 2221)  azithromycin (ZITHROMAX) powder 1 g (1 g Oral Given 02/26/17 2221)     Initial Impression / Assessment and Plan / ED Course  I have reviewed the triage vital signs and the nursing notes.  Pertinent labs & imaging results that were available during my care of the patient were reviewed by me and considered in my medical decision making (see chart for details).    Patient presenting with symptoms concerning for acute appendicitis. Ordered nothing by mouth, IV fluids, CT abdomen and pelvis with contrast, and analgesia and antiemetic. Will reassess.  On reassessment patient had improved after analgesia.  CT without evidence of acute appendicitis but showing evidence of Inflammation likely due to infectious process. Right ovarian cyst likely ruptured and could be the cause of her discomfort.  Pelvic exam Patient with abundant milky white discharge on exam and cervical motion tenderness.  Tenderness palpation of the right adnexa on manual exam. Patient then reported that she's had a history of PID in the past but was not treated. No recent antibiotic use. He explains that she's had BV multiple times in the past but no STIs.  She was given single-dose ceftriaxone IM and azithromycin while in the emergency department. Wet prep without evidence of bacterial vaginosis. Will discharge home with doxycycline and metronidazole. Advised to avoid sexual intercourse for one week.  She was advised that she would receive a call if any of the results were positive. Discharge with close follow-up with GYN and PCP.  Discussed strict return precautions and advised to return to the emergency department if experiencing any new or worsening symptoms. Instructions were understood and patient agreed with discharge plan. Final Clinical Impressions(s) / ED Diagnoses   Final diagnoses:  Pelvic pain in female  Bacterial vaginosis    New Prescriptions Discharge Medication List as of 02/26/2017 10:20 PM    START taking these medications   Details  doxycycline (VIBRAMYCIN) 100 MG capsule Take 1 capsule (100 mg total) by mouth 2 (two) times daily., Starting Fri 02/26/2017, Until Fri 03/12/2017, Print    metroNIDAZOLE (FLAGYL) 500 MG tablet Take 1 tablet (500 mg total) by mouth 2 (two) times daily., Starting Fri 02/26/2017, Until Fri 03/05/2017, Print         Emeline General, PA-C 02/26/17 1308    Orlie Dakin, MD 02/26/17 2358

## 2017-02-26 NOTE — ED Notes (Signed)
ED provider notified of pts impatience with the wait.

## 2017-02-27 LAB — RPR: RPR Ser Ql: NONREACTIVE

## 2017-02-27 LAB — HIV ANTIBODY (ROUTINE TESTING W REFLEX): HIV SCREEN 4TH GENERATION: NONREACTIVE

## 2017-03-01 LAB — GC/CHLAMYDIA PROBE AMP (~~LOC~~) NOT AT ARMC
CHLAMYDIA, DNA PROBE: NEGATIVE
NEISSERIA GONORRHEA: NEGATIVE

## 2017-04-06 ENCOUNTER — Ambulatory Visit (INDEPENDENT_AMBULATORY_CARE_PROVIDER_SITE_OTHER): Payer: 59 | Admitting: Cardiovascular Disease

## 2017-04-06 ENCOUNTER — Encounter: Payer: Self-pay | Admitting: Cardiovascular Disease

## 2017-04-06 VITALS — BP 98/62 | HR 90 | Ht 59.0 in | Wt 99.6 lb

## 2017-04-06 DIAGNOSIS — R002 Palpitations: Secondary | ICD-10-CM

## 2017-04-06 DIAGNOSIS — R0789 Other chest pain: Secondary | ICD-10-CM

## 2017-04-06 DIAGNOSIS — R079 Chest pain, unspecified: Secondary | ICD-10-CM

## 2017-04-06 NOTE — Progress Notes (Signed)
Cardiology Office Note   Date:  04/06/2017   ID:  [name], DOB [birth year], MRN [id number]  PCP:  Javier Docker, MD  Cardiologist:   Skeet Latch, MD   Chief Complaint  Patient presents with  . Follow-up    pt c/o chest pain off and on, swelling in legs and dizziness    History of Present Illness: Eileen [name] is a 26 y.o. pregnant female with anxiety, rheumatoid arthritis, Hashimoto's thyroiditis, and tobacco abuse who presents for follow up.  She was initially seen 06/2016 for an evaluation of tachycardia. She was seen by her OB/GYN on 07/07/16 and noted to be tachycardic with a heart rate of 117 bpm. She reported a history of mitral valve prolapse and was referred to cardiology for further evaluation.  TSH was within normal limits.  Potassium was within normal limits on 06/15/16 and her hemoglobin was 9.4 at that time.  She reports being diagnosed with mitral valve prolapse six years ago with her first pregnancy.  She has palpitations at baseline, but it is always worse with pregnancy. She was referred for an echocardiogram that revealed LVEF 65-70% with grade 1 diastolic dysfunction and no significant valvular abnormalities.  She was started on metoprolol 25 mg twice daily.  However she was unable to tolerate this due to fatigue and lightheadedness.  Since her last appointment Eileen [name] continues to have daily palpitations. This occurs several times throughout the day. It typically occurs at rest. She denies exertional symptoms. She also has chest pain approximately once per week. The episodes last for about 15 minutes. They're associated with left arm tingling tingling and left face numbness. Symptoms improved with laying down. She gets short of breath when this occurs. She has no lower extremity edema, orthopnea, or PND. He continues to smoke half pack of cigarettes daily. She hopes to quit smoking soon.  Eileen [name] does report having stress. She works full-time and has 35  young children. She wonders if this could be contributing    Past Medical History:  Diagnosis Date  . Abortion 08/04/2011  . Anemia   . Anxiety   . Cholestasis during pregnancy   . Chronic kidney disease    kidney infection.  . Depression   . Dysuria   . Fibromyalgia   . Headache(784.0)   . Hypothyroid    history of.  . Mitral valve prolapse   . RA (rheumatoid arthritis) ([place])     Past Surgical History:  Procedure Laterality Date  . DILATION AND CURETTAGE OF UTERUS    . LAPAROSCOPY N/A 05/25/2014   Procedure: LAPAROSCOPY DIAGNOSTIC;  Surgeon: Osborne Oman, MD;  Location: [facility];  Service: Gynecology;  Laterality: N/A;  . THERAPEUTIC ABORTION N/A 2012  . TUBAL LIGATION Bilateral 10/22/2016   Procedure: POST PARTUM TUBAL LIGATION;  Surgeon: Mora Bellman, MD;  Location: [facility];  Service: Gynecology;  Laterality: Bilateral;     Current Outpatient Prescriptions  Medication Sig Dispense Refill  . Acetaminophen (TYLENOL PO) Take 2 tablets by mouth daily as needed (pain).    Marland Kitchen ALPRAZolam (XANAX) 0.5 MG tablet Take 0.5 mg by mouth 2 (two) times daily as needed for anxiety.     . DULoxetine (CYMBALTA) 30 MG capsule Take 30 mg by mouth daily.    Marland Kitchen lurasidone (LATUDA) 20 MG TABS tablet Take 20 mg by mouth daily.     No current facility-administered medications for this visit.     Allergies:   Eggs or egg-derived products;  Food; Red dye; and Sulfa antibiotics    Social History:  The patient  reports that she has been smoking Cigarettes.  She has been smoking about 0.50 packs per day. She uses smokeless tobacco. She reports that she drinks alcohol. She reports that she does not use drugs.   Family History:  The patient's family history includes Asthma in her brother; Cancer in her mother; Diabetes in her maternal grandmother; Emphysema in her paternal grandfather; Heart attack in her mother; Heart disease in her mother; Hyperlipidemia in her maternal grandmother and mother; Mitral  valve prolapse in her mother; Thyroid disease in her mother.    ROS:  Please see the history of present illness.   Otherwise, review of systems are positive for none.   All other systems are reviewed and negative.    PHYSICAL EXAM: VS:  BP 98/62   Pulse 90   Ht 4\' 11"  (1.499 m)   Wt 45.2 kg (99 lb 9.6 oz)   BMI 20.12 kg/m  , BMI Body mass index is 20.12 kg/m. GENERAL:  Well appearing HEENT: Pupils equal round and reactive, fundi not visualized, oral mucosa unremarkable NECK:  No jugular venous distention, waveform within normal limits, carotid upstroke brisk and symmetric, no bruits, no thyromegaly LYMPHATICS:  No cervical adenopathy LUNGS:  Clear to auscultation bilaterally HEART:  RRR.  PMI not displaced or sustained,S1 and S2 within normal limits, no S3, no S4, no clicks, no rubs, no murmurs ABD:  Flat, positive bowel sounds normal in frequency in pitch, no bruits, no rebound, no guarding, no midline pulsatile mass, no hepatomegaly, no splenomegaly EXT:  2 plus pulses throughout, no edema, no cyanosis no clubbing SKIN:  No rashes no nodules NEURO:  Cranial nerves II through XII grossly intact, motor grossly intact throughout PSYCH:  Cognitively intact, oriented to person place and time   EKG:  EKG is ordered today. The ekg ordered 07/08/16 demonstrates sinus tachycardia rate 124 bpm.   04/06/17: Sinus rhythm. Rate 80 bpm.  Echo 08/03/16: Study Conclusions  - Left ventricle: The cavity size was normal. Wall thickness was   normal. Systolic function was vigorous. The estimated ejection   fraction was in the range of 65% to 70%. Wall motion was normal;   there were no regional wall motion abnormalities. Doppler   parameters are consistent with abnormal left ventricular   relaxation (grade 1 diastolic dysfunction). - Aortic valve: There was no stenosis. - Mitral valve: There was no significant regurgitation. - Right ventricle: The cavity size was normal. Systolic function    was normal. - Pulmonary arteries: No complete TR doppler jet so unable to   estimate PA systolic pressure. - Inferior vena cava: The vessel was normal in size. The   respirophasic diameter changes were in the normal range (>= 50%),   consistent with normal central venous pressure.  Impressions:  - Sinus tachycardia. Normal LV size with EF 65-70%. Normal RV size   and systolic function. No significant valvular abnormalities.  Recent Labs: 07/07/2016: TSH 3.800 07/08/2016: Magnesium 1.7 02/26/2017: ALT 20; BUN 8; Creatinine, Ser 0.79; Hemoglobin 10.4; Platelets 221; Potassium 3.8; Sodium 137    Lipid Panel    Component Value Date/Time   CHOL 212 (H) 06/28/2014 0954   TRIG 62.0 06/28/2014 0954   HDL 32.50 (L) 06/28/2014 0954   CHOLHDL 7 06/28/2014 0954   VLDL 12.4 06/28/2014 0954   LDLCALC 167 (H) 06/28/2014 0954      Wt Readings from Last 3 Encounters:  04/06/17 45.2 kg (99 lb 9.6 oz)  02/26/17 46.3 kg (102 lb)  10/21/16 63.5 kg (140 lb)      ASSESSMENT AND PLAN:  # Palpitations: # Mitral valve prolapse: Eileen [name] Previously had sinus tachycardia at rest. She was started on metoprolol but unable to tolerate it due to low blood pressures. Since delivering her baby she no longer has resting sinus tachycardia. However she does continue to have palpitations. It is unclear what is causing these. We will get a 24-hour Holter to assess.   # Atypical chest pain: ETT.  Given her recent delivery, cannot rule out coronary abnormalities.  # Tobacco abuse: Discussed the importance of smoking cessation.  Offered options of nicotine replacement therapy, Chantix, and Wellbutrin. She is not ready to quit at this time. We discussed smoking cessation for 5 minutes.  Current medicines are reviewed at length with the patient today.  The patient does not have concerns regarding medicines.  The following changes have been made: none  Labs/ tests ordered today include:   Orders Placed This  Encounter  Procedures  . Holter monitor - 24 hour  . Exercise Tolerance Test  . EKG 12-Lead     Disposition:   FU with Berdene Askari C. Oval Linsey, MD, [facility] in 1 month.  This note was written with the assistance of speech recognition software.  Please excuse any transcriptional errors.  Signed, Kori Goins C. Oval Linsey, MD, [facility]  04/06/2017 9:06 AM    [place]

## 2017-04-06 NOTE — Patient Instructions (Addendum)
Medication Instructions:  Your physician recommends that you continue on your current medications as directed. Please refer to the Current Medication list given to you today.  Labwork: NONE  Testing/Procedures: Your physician has recommended that you wear a holter monitor. Holter monitors are medical devices that record the heart's electrical activity. Doctors most often use these monitors to diagnose arrhythmias. Arrhythmias are problems with the speed or rhythm of the heartbeat. The monitor is a small, portable device. You can wear one while you do your normal daily activities. This is usually used to diagnose what is causing palpitations/syncope (passing out). [facility] has requested that you have an exercise tolerance test. For further information please visit [email or web address]. Please also follow instruction sheet, as given.  Follow-Up: Your physician recommends that you schedule a follow-up appointment in: 4-6 WEEKS   If you need a refill on your cardiac medications before your next appointment, please call your pharmacy.   Exercise Stress Electrocardiogram An exercise stress electrocardiogram is a test to check how blood flows to your heart. It is done to find areas of poor blood flow. You will need to walk on a treadmill for this test. The electrocardiogram will record your heartbeat when you are at rest and when you are exercising. What happens before the procedure?  Do not have drinks with caffeine or foods with caffeine for 24 hours before the test, or as told by your doctor. This includes coffee, tea (even decaf tea), sodas, chocolate, and cocoa.  Follow your doctor's instructions about eating and drinking before the test.  Ask your doctor what medicines you should or should not take before the test. Take your medicines with water unless told by your doctor not to.  If you use an inhaler, bring it with you to the test.  Bring a snack to eat after the  test.  Do not  smoke for 4 hours before the test.  Do not put lotions, powders, creams, or oils on your chest before the test.  Wear comfortable shoes and clothing. What happens during the procedure?  You will have patches put on your chest. Small areas of your chest may need to be shaved. Wires will be connected to the patches.  Your heart rate will be watched while you are resting and while you are exercising.  You will walk on the treadmill. The treadmill will slowly get faster to raise your heart rate.  The test will take about 1-2 hours. What happens after the procedure?  Your heart rate and blood pressure will be watched after the test.  You may return to your normal diet, activities, and medicines or as told by your doctor. This information is not intended to replace advice given to you by your health care provider. Make sure you discuss any questions you have with your health care provider. Document Released: 12/30/2007 Document Revised: 03/11/2016 Document Reviewed: 03/20/2013 Elsevier Interactive Patient Education  [employer].

## 2017-04-14 ENCOUNTER — Telehealth (HOSPITAL_COMMUNITY): Payer: Self-pay

## 2017-04-14 NOTE — Telephone Encounter (Signed)
Encounter complete. 

## 2017-04-16 ENCOUNTER — Inpatient Hospital Stay (HOSPITAL_COMMUNITY): Admission: RE | Admit: 2017-04-16 | Payer: Medicaid Other | Source: Ambulatory Visit

## 2017-04-19 ENCOUNTER — Ambulatory Visit (INDEPENDENT_AMBULATORY_CARE_PROVIDER_SITE_OTHER): Payer: 59

## 2017-04-19 DIAGNOSIS — R002 Palpitations: Secondary | ICD-10-CM

## 2017-04-19 DIAGNOSIS — R079 Chest pain, unspecified: Secondary | ICD-10-CM

## 2017-04-21 ENCOUNTER — Telehealth (HOSPITAL_COMMUNITY): Payer: Self-pay

## 2017-04-21 NOTE — Telephone Encounter (Signed)
Encounter complete. 

## 2017-04-23 ENCOUNTER — Ambulatory Visit (HOSPITAL_COMMUNITY)
Admission: RE | Admit: 2017-04-23 | Discharge: 2017-04-23 | Disposition: A | Discharge status: Home / Self Care | Payer: 59 | Source: Ambulatory Visit | Attending: Cardiovascular Disease | Admitting: Cardiovascular Disease

## 2017-04-23 DIAGNOSIS — R002 Palpitations: Secondary | ICD-10-CM | POA: Diagnosis not present

## 2017-04-23 DIAGNOSIS — R079 Chest pain, unspecified: Secondary | ICD-10-CM | POA: Insufficient documentation

## 2017-04-23 LAB — EXERCISE TOLERANCE TEST
CSEPED: 9 min
CSEPEW: 11.3 METS
CSEPHR: 90 %
CSEPPHR: 176 {beats}/min
Exercise duration (sec): 47 s
MPHR: 194 {beats}/min
RPE: 16
Rest HR: 74 {beats}/min

## 2017-05-23 NOTE — Progress Notes (Deleted)
Cardiology Office Note   Date:  05/27/2017   ID:  Eileen [name], DOB Mar 19, 1991, MRN [id number]  PCP:  Javier Docker, MD  Cardiologist:   Skeet Latch, MD   No chief complaint on file.   History of Present Illness: Eileen [name] is a 26 y.o. pregnant female with anxiety, rheumatoid arthritis, Hashimoto's thyroiditis, and tobacco abuse who presents for follow up.  She was initially seen 06/2016 for an evaluation of tachycardia. She was seen by her OB/GYN on 07/07/16 and noted to be tachycardic with a heart rate of 117 bpm. She reported a history of mitral valve prolapse and was referred to cardiology for further evaluation.  TSH was within normal limits.  Potassium was within normal limits on 06/15/16 and her hemoglobin was 9.4 at that time.  She reports being diagnosed with mitral valve prolapse with her first pregnancy.  She has palpitations at baseline, but it is always worse with pregnancy. She was referred for an echocardiogram that revealed LVEF 65-70% with grade 1 diastolic dysfunction and no significant valvular abnormalities.  She was started on metoprolol 25 mg twice daily.  However she was unable to tolerate this due to fatigue and lightheadedness.  At her last appointment she continued to have daily palpitations.  She was referred for a 24 hour Holter that showed episodes of sinus tachycardia at rest and rare PVCs. She also reported atypical chest pain.  She was referred for an ETT 04/23/17 that was negative for ischemia.  She achieved 11.3 METs on a Bruce protocol.     F/u tobacco   Ms. Eileen [name] does report having stress. She works full-time and has 24 young children. She wonders if this could be contributing    Past Medical History:  Diagnosis Date  . Abortion 08/04/2011  . Anemia   . Anxiety   . Cholestasis during pregnancy   . Chronic kidney disease    kidney infection.  . Depression   . Dysuria   . Fibromyalgia   . Headache(784.0)   . Hypothyroid    history  of.  . Mitral valve prolapse   . RA (rheumatoid arthritis) ([place])     Past Surgical History:  Procedure Laterality Date  . DILATION AND CURETTAGE OF UTERUS    . LAPAROSCOPY N/A 05/25/2014   Procedure: LAPAROSCOPY DIAGNOSTIC;  Surgeon: Osborne Oman, MD;  Location: [facility];  Service: Gynecology;  Laterality: N/A;  . THERAPEUTIC ABORTION N/A 2012  . TUBAL LIGATION Bilateral 10/22/2016   Procedure: POST PARTUM TUBAL LIGATION;  Surgeon: Mora Bellman, MD;  Location: [facility];  Service: Gynecology;  Laterality: Bilateral;     Current Outpatient Prescriptions  Medication Sig Dispense Refill  . Acetaminophen (TYLENOL PO) Take 2 tablets by mouth daily as needed (pain).    Marland Kitchen ALPRAZolam (XANAX) 0.5 MG tablet Take 0.5 mg by mouth 2 (two) times daily as needed for anxiety.     . DULoxetine (CYMBALTA) 30 MG capsule Take 30 mg by mouth daily.    Marland Kitchen lurasidone (LATUDA) 20 MG TABS tablet Take 20 mg by mouth daily.     No current facility-administered medications for this visit.     Allergies:   Eggs or egg-derived products; Food; Red dye; and Sulfa antibiotics    Social History:  The patient  reports that she has been smoking Cigarettes.  She has been smoking about 0.50 packs per day. She uses smokeless tobacco. She reports that she drinks alcohol. She reports that she does not use  drugs.   Family History:  The patient's family history includes Asthma in her brother; Cancer in her mother; Diabetes in her maternal grandmother; Emphysema in her paternal grandfather; Heart attack in her mother; Heart disease in her mother; Hyperlipidemia in her maternal grandmother and mother; Mitral valve prolapse in her mother; Thyroid disease in her mother.    ROS:  Please see the history of present illness.   Otherwise, review of systems are positive for none.   All other systems are reviewed and negative.    PHYSICAL EXAM: VS:  There were no vitals taken for this visit. , BMI There is no height or weight on  file to calculate BMI. GENERAL:  Well appearing HEENT: Pupils equal round and reactive, fundi not visualized, oral mucosa unremarkable NECK:  No jugular venous distention, waveform within normal limits, carotid upstroke brisk and symmetric, no bruits, no thyromegaly LYMPHATICS:  No cervical adenopathy LUNGS:  Clear to auscultation bilaterally HEART:  RRR.  PMI not displaced or sustained,S1 and S2 within normal limits, no S3, no S4, no clicks, no rubs, no murmurs ABD:  Flat, positive bowel sounds normal in frequency in pitch, no bruits, no rebound, no guarding, no midline pulsatile mass, no hepatomegaly, no splenomegaly EXT:  2 plus pulses throughout, no edema, no cyanosis no clubbing SKIN:  No rashes no nodules NEURO:  Cranial nerves II through XII grossly intact, motor grossly intact throughout PSYCH:  Cognitively intact, oriented to person place and time   EKG:  EKG is ordered today. The ekg ordered 07/08/16 demonstrates sinus tachycardia rate 124 bpm.   04/06/17: Sinus rhythm. Rate 80 bpm.  Echo 08/03/16: Study Conclusions  - Left ventricle: The cavity size was normal. Wall thickness was   normal. Systolic function was vigorous. The estimated ejection   fraction was in the range of 65% to 70%. Wall motion was normal;   there were no regional wall motion abnormalities. Doppler   parameters are consistent with abnormal left ventricular   relaxation (grade 1 diastolic dysfunction). - Aortic valve: There was no stenosis. - Mitral valve: There was no significant regurgitation. - Right ventricle: The cavity size was normal. Systolic function   was normal. - Pulmonary arteries: No complete TR doppler jet so unable to   estimate PA systolic pressure. - Inferior vena cava: The vessel was normal in size. The   respirophasic diameter changes were in the normal range (>= 50%),   consistent with normal central venous pressure.  Impressions:  - Sinus tachycardia. Normal LV size with EF  65-70%. Normal RV size   and systolic function. No significant valvular abnormalities.   24 Hour Holter Monitor 04/19/17:  Quality: Fair.  Baseline artifact. Predominant rhythm: sinus rhythm Average heart rate: 91 bpm Max heart rate: 144 bpm Min heart rate: 54 bpm  Palpitations reported at which time sinus tachycardia in the 110s was noted. Rare PVCs  ETT 04/23/17:  Blood pressure demonstrated a normal response to exercise.  There was no ST segment deviation noted during stress.   Normal ECG stress test.   Recent Labs: 07/07/2016: TSH 3.800 07/08/2016: Magnesium 1.7 02/26/2017: ALT 20; BUN 8; Creatinine, Ser 0.79; Hemoglobin 10.4; Platelets 221; Potassium 3.8; Sodium 137    Lipid Panel    Component Value Date/Time   CHOL 212 (H) 06/28/2014 0954   TRIG 62.0 06/28/2014 0954   HDL 32.50 (L) 06/28/2014 0954   CHOLHDL 7 06/28/2014 0954   VLDL 12.4 06/28/2014 0954   LDLCALC 167 (H) 06/28/2014 4315  Wt Readings from Last 3 Encounters:  04/06/17 45.2 kg (99 lb 9.6 oz)  02/26/17 46.3 kg (102 lb)  10/21/16 63.5 kg (140 lb)      ASSESSMENT AND PLAN:  # Palpitations: # Mitral valve prolapse: Eileen [name] Previously had sinus tachycardia at rest. She was started on metoprolol but unable to tolerate it due to low blood pressures. Since delivering her baby she no longer has resting sinus tachycardia. However she does continue to have palpitations. It is unclear what is causing these. We will get a 24-hour Holter to assess.   # Atypical chest pain: ETT.  Given her recent delivery, cannot rule out coronary abnormalities.  # Tobacco abuse: Discussed the importance of smoking cessation.  Offered options of nicotine replacement therapy, Chantix, and Wellbutrin. She is not ready to quit at this time. We discussed smoking cessation for 5 minutes.  Current medicines are reviewed at length with the patient today.  The patient does not have concerns regarding medicines.  The  following changes have been made: none  Labs/ tests ordered today include:   No orders of the defined types were placed in this encounter.    Disposition:   FU with Husayn Reim C. Oval Linsey, MD, [facility] in 1 month.  This note was written with the assistance of speech recognition software.  Please excuse any transcriptional errors.  Signed, Amy Belloso C. Oval Linsey, MD, [facility]  05/27/2017 8:00 AM    [place]

## 2017-05-27 ENCOUNTER — Ambulatory Visit: Payer: 59 | Admitting: Cardiovascular Disease

## 2017-06-23 ENCOUNTER — Ambulatory Visit: Payer: 59 | Admitting: Cardiovascular Disease

## 2017-06-23 ENCOUNTER — Telehealth: Payer: Self-pay | Admitting: Cardiovascular Disease

## 2017-06-23 NOTE — Telephone Encounter (Signed)
Returned the call to the patient. She has cancelled her appointment for today with Dr. Oval Linsey stating that she read her results on My Chart and therefore did not feel it necessary to come in at this time. She will call the office with any questions.

## 2017-06-23 NOTE — Telephone Encounter (Signed)
New Message      Please call patient with results for test , she can not come into the office

## 2017-06-23 NOTE — Progress Notes (Deleted)
Cardiology Office Note   Date:  06/23/2017   ID:  [name], DOB [birth year], MRN [id number]  PCP:  Javier Docker, MD  Cardiologist:   Skeet Latch, MD   No chief complaint on file.   History of Present Illness: Eileen [name] is a 27 y.o. pregnant female with anxiety, rheumatoid arthritis, Hashimoto's thyroiditis, and tobacco abuse who presents for follow up.  She was initially seen 06/2016 for an evaluation of tachycardia. She was seen by her OB/GYN on 07/07/16 and noted to be tachycardic with a heart rate of 117 bpm. She reported a history of mitral valve prolapse and was referred to cardiology for further evaluation.  TSH was within normal limits.  Potassium was within normal limits on 06/15/16 and her hemoglobin was 9.4 at that time.  She reports being diagnosed with mitral valve prolapse with her first pregnancy.  She has palpitations at baseline, but it is always worse with pregnancy. She was referred for an echocardiogram that revealed LVEF 65-70% with grade 1 diastolic dysfunction and no significant valvular abnormalities.  She was started on metoprolol 25 mg twice daily.  However she was unable to tolerate this due to fatigue and lightheadedness.  At her last appointment she continued to have daily palpitations.  She was referred for a 24 hour Holter that showed episodes of sinus tachycardia at rest and rare PVCs. She also reported atypical chest pain.  She was referred for an ETT 04/23/17 that was negative for ischemia.  She achieved 11.3 METs on a Bruce protocol.     F/u tobacco   Eileen [name] does report having stress. She works full-time and has 45 young children. She wonders if this could be contributing    Past Medical History:  Diagnosis Date  . Abortion 08/04/2011  . Anemia   . Anxiety   . Cholestasis during pregnancy   . Chronic kidney disease    kidney infection.  . Depression   . Dysuria   . Fibromyalgia   . Headache(784.0)   . Hypothyroid    history  of.  . Mitral valve prolapse   . RA (rheumatoid arthritis) ([place])     Past Surgical History:  Procedure Laterality Date  . DILATION AND CURETTAGE OF UTERUS    . LAPAROSCOPY N/A 05/25/2014   Procedure: LAPAROSCOPY DIAGNOSTIC;  Surgeon: Osborne Oman, MD;  Location: [facility];  Service: Gynecology;  Laterality: N/A;  . THERAPEUTIC ABORTION N/A 2012  . TUBAL LIGATION Bilateral 10/22/2016   Procedure: POST PARTUM TUBAL LIGATION;  Surgeon: Mora Bellman, MD;  Location: [facility];  Service: Gynecology;  Laterality: Bilateral;     Current Outpatient Medications  Medication Sig Dispense Refill  . Acetaminophen (TYLENOL PO) Take 2 tablets by mouth daily as needed (pain).    Marland Kitchen ALPRAZolam (XANAX) 0.5 MG tablet Take 0.5 mg by mouth 2 (two) times daily as needed for anxiety.     . DULoxetine (CYMBALTA) 30 MG capsule Take 30 mg by mouth daily.    Marland Kitchen lurasidone (LATUDA) 20 MG TABS tablet Take 20 mg by mouth daily.     No current facility-administered medications for this visit.     Allergies:   Eggs or egg-derived products; Food; Red dye; and Sulfa antibiotics    Social History:  The patient  reports that she has been smoking cigarettes.  She has been smoking about 0.50 packs per day. She uses smokeless tobacco. She reports that she drinks alcohol. She reports that she does not use  drugs.   Family History:  The patient's family history includes Asthma in her brother; Cancer in her mother; Diabetes in her maternal grandmother; Emphysema in her paternal grandfather; Heart attack in her mother; Heart disease in her mother; Hyperlipidemia in her maternal grandmother and mother; Mitral valve prolapse in her mother; Thyroid disease in her mother.    ROS:  Please see the history of present illness.   Otherwise, review of systems are positive for none.   All other systems are reviewed and negative.    PHYSICAL EXAM: VS:  There were no vitals taken for this visit. , BMI There is no height or weight on file  to calculate BMI. GENERAL:  Well appearing HEENT: Pupils equal round and reactive, fundi not visualized, oral mucosa unremarkable NECK:  No jugular venous distention, waveform within normal limits, carotid upstroke brisk and symmetric, no bruits, no thyromegaly LYMPHATICS:  No cervical adenopathy LUNGS:  Clear to auscultation bilaterally HEART:  RRR.  PMI not displaced or sustained,S1 and S2 within normal limits, no S3, no S4, no clicks, no rubs, no murmurs ABD:  Flat, positive bowel sounds normal in frequency in pitch, no bruits, no rebound, no guarding, no midline pulsatile mass, no hepatomegaly, no splenomegaly EXT:  2 plus pulses throughout, no edema, no cyanosis no clubbing SKIN:  No rashes no nodules NEURO:  Cranial nerves II through XII grossly intact, motor grossly intact throughout PSYCH:  Cognitively intact, oriented to person place and time   EKG:  EKG is ordered today. The ekg ordered 07/08/16 demonstrates sinus tachycardia rate 124 bpm.   04/06/17: Sinus rhythm. Rate 80 bpm.  Echo 08/03/16: Study Conclusions  - Left ventricle: The cavity size was normal. Wall thickness was   normal. Systolic function was vigorous. The estimated ejection   fraction was in the range of 65% to 70%. Wall motion was normal;   there were no regional wall motion abnormalities. Doppler   parameters are consistent with abnormal left ventricular   relaxation (grade 1 diastolic dysfunction). - Aortic valve: There was no stenosis. - Mitral valve: There was no significant regurgitation. - Right ventricle: The cavity size was normal. Systolic function   was normal. - Pulmonary arteries: No complete TR doppler jet so unable to   estimate PA systolic pressure. - Inferior vena cava: The vessel was normal in size. The   respirophasic diameter changes were in the normal range (>= 50%),   consistent with normal central venous pressure.  Impressions:  - Sinus tachycardia. Normal LV size with EF 65-70%.  Normal RV size   and systolic function. No significant valvular abnormalities.   24 Hour Holter Monitor 04/19/17:  Quality: Fair.  Baseline artifact. Predominant rhythm: sinus rhythm Average heart rate: 91 bpm Max heart rate: 144 bpm Min heart rate: 54 bpm  Palpitations reported at which time sinus tachycardia in the 110s was noted. Rare PVCs  ETT 04/23/17:  Blood pressure demonstrated a normal response to exercise.  There was no ST segment deviation noted during stress.   Normal ECG stress test.   Recent Labs: 07/07/2016: TSH 3.800 07/08/2016: Magnesium 1.7 02/26/2017: ALT 20; BUN 8; Creatinine, Ser 0.79; Hemoglobin 10.4; Platelets 221; Potassium 3.8; Sodium 137    Lipid Panel    Component Value Date/Time   CHOL 212 (H) 06/28/2014 0954   TRIG 62.0 06/28/2014 0954   HDL 32.50 (L) 06/28/2014 0954   CHOLHDL 7 06/28/2014 0954   VLDL 12.4 06/28/2014 0954   LDLCALC 167 (H) 06/28/2014 7824  Wt Readings from Last 3 Encounters:  04/06/17 99 lb 9.6 oz (45.2 kg)  02/26/17 102 lb (46.3 kg)  10/21/16 140 lb (63.5 kg)      ASSESSMENT AND PLAN:  # Palpitations: # Mitral valve prolapse: Eileen [name] previously had sinus tachycardia at rest. She was started on metoprolol but unable to tolerate it due to low blood pressures. Since delivering her baby she no longer has resting sinus tachycardia. However she does continue to have palpitations. It is unclear what is causing these. We will get a 24-hour Holter to assess.   # Atypical chest pain: ETT.  Given her recent delivery, cannot rule out coronary abnormalities.  # Tobacco abuse: Discussed the importance of smoking cessation.  Offered options of nicotine replacement therapy, Chantix, and Wellbutrin. She is not ready to quit at this time. We discussed smoking cessation for 5 minutes.  Current medicines are reviewed at length with the patient today.  The patient does not have concerns regarding medicines.  The following  changes have been made: none  Labs/ tests ordered today include:   No orders of the defined types were placed in this encounter.    Disposition:   FU with Sola Margolis C. Oval Linsey, MD, [facility] in 1 month.  This note was written with the assistance of speech recognition software.  Please excuse any transcriptional errors.  Signed, Charlye Spare C. Oval Linsey, MD, [facility]  06/23/2017 6:21 AM    [place]

## 2017-08-10 DIAGNOSIS — R5383 Other fatigue: Secondary | ICD-10-CM | POA: Diagnosis not present

## 2017-09-21 DIAGNOSIS — M059 Rheumatoid arthritis with rheumatoid factor, unspecified: Secondary | ICD-10-CM | POA: Diagnosis not present

## 2017-09-21 DIAGNOSIS — D509 Iron deficiency anemia, unspecified: Secondary | ICD-10-CM | POA: Diagnosis not present

## 2017-09-21 DIAGNOSIS — E063 Autoimmune thyroiditis: Secondary | ICD-10-CM | POA: Diagnosis not present

## 2017-10-07 ENCOUNTER — Ambulatory Visit (HOSPITAL_COMMUNITY)
Admission: EM | Admit: 2017-10-07 | Discharge: 2017-10-07 | Disposition: A | Discharge status: Home / Self Care | Payer: 59 | Attending: Internal Medicine | Admitting: Internal Medicine

## 2017-10-07 ENCOUNTER — Encounter (HOSPITAL_COMMUNITY): Payer: Self-pay | Admitting: Emergency Medicine

## 2017-10-07 DIAGNOSIS — B9789 Other viral agents as the cause of diseases classified elsewhere: Secondary | ICD-10-CM | POA: Diagnosis not present

## 2017-10-07 DIAGNOSIS — J069 Acute upper respiratory infection, unspecified: Secondary | ICD-10-CM

## 2017-10-07 MED ORDER — SALINE SPRAY 0.65 % NA SOLN: 1.0000 | NASAL | 0 refills | Status: DC | PRN

## 2017-10-07 MED ORDER — GUAIFENESIN ER 600 MG PO TB12: 1200.0000 mg | ORAL_TABLET | Freq: Two times a day (BID) | ORAL | 0 refills | Status: DC

## 2017-10-07 MED ORDER — FLUTICASONE PROPIONATE 50 MCG/ACT NA SUSP: 1.0000 | Freq: Every day | NASAL | 2 refills | Status: DC

## 2017-10-07 NOTE — Discharge Instructions (Signed)
Push fluids to ensure adequate hydration and keep secretions thin.  Tylenol and/or ibuprofen as needed for pain or fevers.  Daily flonase to help with ear pressure. Nasal saline to promote moisture to the nose. Mucinex twice a day as an expectorant.  If symptoms worsen or do not improve in the next week to return to be seen or to follow up with your PCP.

## 2017-10-07 NOTE — ED Provider Notes (Signed)
[place]    CSN: [id number] Arrival date & time: 10/07/17  1458     History   Chief Complaint Chief Complaint  Patient presents with  . Influenza    HPI Eileen [name] is a 27 y.o. female.   Ercel presents with complaints of productive cough, bilateral ear pain, sore throat, nausea decreased appetite, which started two days ago. Originally with 2 episodes of vomiting, has not had any since. Runny nose and congestion. Two days ago she had a bloody nose. Has not had since. Her mother and daughter have been ill. Has been taking robitussin which has not helped. History of kidney infections, fibromyalgia, RA, headache.    ROS per HPI.       Past Medical History:  Diagnosis Date  . Abortion 08/04/2011  . Anemia   . Anxiety   . Cholestasis during pregnancy   . Chronic kidney disease    kidney infection.  . Depression   . Dysuria   . Fibromyalgia   . Headache(784.0)   . Hypothyroid    history of.  . Mitral valve prolapse   . RA (rheumatoid arthritis) [facility])     Patient Active Problem List   Diagnosis Date Noted  . Encounter for sterilization   . Cholestasis during pregnancy in third trimester 10/21/2016  . Cholestasis during pregnancy, antepartum 09/30/2016  . Anemia affecting pregnancy 08/04/2016  . Back pain affecting pregnancy 08/04/2016  . Tachycardia 06/16/2016  . Supervision of normal pregnancy, antepartum 06/09/2016  . Rheumatoid arthritis ([place]) 04/07/2016  . Tobacco abuse 04/07/2016  . Depression with anxiety 11/01/2013  . Anxiety 08/01/2013  . Goiter, unspecified 06/06/2007    Past Surgical History:  Procedure Laterality Date  . DILATION AND CURETTAGE OF UTERUS    . LAPAROSCOPY N/A 05/25/2014   Procedure: LAPAROSCOPY DIAGNOSTIC;  Surgeon: Osborne Oman, MD;  Location: [facility];  Service: Gynecology;  Laterality: N/A;  . THERAPEUTIC ABORTION N/A 2012  . TUBAL LIGATION Bilateral 10/22/2016   Procedure: POST PARTUM TUBAL LIGATION;   Surgeon: Mora Bellman, MD;  Location: [facility];  Service: Gynecology;  Laterality: Bilateral;    OB History    Gravida Para Term Preterm AB Living   6 4 4  0 2 4   SAB TAB Ectopic Multiple Live Births     2 0 0 4       Home Medications    Prior to Admission medications   Medication Sig Start Date End Date Taking? Authorizing Provider  clonazePAM (KLONOPIN) 0.5 MG tablet Take 0.5 mg by mouth 2 (two) times daily as needed for anxiety.   Yes [provider]  DULoxetine (CYMBALTA) 30 MG capsule Take 30 mg by mouth daily.   Yes [provider]  lurasidone (LATUDA) 20 MG TABS tablet Take 20 mg by mouth daily.   Yes [provider]  Acetaminophen (TYLENOL PO) Take 2 tablets by mouth daily as needed (pain).    [provider]  ALPRAZolam Duanne Moron) 0.5 MG tablet Take 0.5 mg by mouth 2 (two) times daily as needed for anxiety.     [provider]  fluticasone (FLONASE) 50 MCG/ACT nasal spray Place 1 spray into both nostrils daily. 10/07/17   Zigmund Gottron, NP  guaiFENesin (MUCINEX) 600 MG 12 hr tablet Take 2 tablets (1,200 mg total) by mouth 2 (two) times daily. 10/07/17   Zigmund Gottron, NP  sodium chloride (OCEAN) 0.65 % SOLN nasal spray Place 1 spray into both nostrils as needed for  congestion. 10/07/17   Zigmund Gottron, NP    Family History Family History  Problem Relation Age of Onset  . Cancer Mother        SKIN  . Hyperlipidemia Mother   . Thyroid disease Mother   . Heart disease Mother   . Mitral valve prolapse Mother   . Heart attack Mother   . Asthma Brother   . Diabetes Maternal Grandmother   . Hyperlipidemia Maternal Grandmother   . Emphysema Paternal Grandfather     Social History Social History   Tobacco Use  . Smoking status: Current Every Day Smoker    Packs/day: 0.50    Types: Cigarettes  . Smokeless tobacco: Current User  Substance Use Topics  . Alcohol use: Yes    Alcohol/week: 0.0 oz    Comment: occasional but  not while pregnant  . Drug use: No     Allergies   Eggs or egg-derived products; Food; Red dye; and Sulfa antibiotics   Review of Systems Review of Systems   Physical Exam Triage Vital Signs ED Triage Vitals  Enc Vitals Group     BP 10/07/17 1635 111/79     Pulse Rate 10/07/17 1635 (!) 105     Resp 10/07/17 1635 16     Temp 10/07/17 1635 99.6 F (37.6 C)     Temp Source 10/07/17 1635 Oral     SpO2 10/07/17 1635 99 %     Weight 10/07/17 1634 103 lb (46.7 kg)     Height --      Head Circumference --      Peak Flow --      Pain Score 10/07/17 1634 4     Pain Loc --      Pain Edu? --      Excl. in [place]? --    No data found.  Updated Vital Signs BP 111/79   Pulse (!) 105   Temp 99.6 F (37.6 C) (Oral)   Resp 16   Wt 103 lb (46.7 kg)   LMP 09/27/2017   SpO2 99%   BMI 20.80 kg/m   Visual Acuity Right Eye Distance:   Left Eye Distance:   Bilateral Distance:    Right Eye Near:   Left Eye Near:    Bilateral Near:     Physical Exam  Constitutional: She is oriented to person, place, and time. She appears well-developed and well-nourished. No distress.  HENT:  Head: Normocephalic and atraumatic.  Right Ear: Tympanic membrane, external ear and ear canal normal.  Left Ear: Tympanic membrane, external ear and ear canal normal.  Nose: Rhinorrhea present. Right sinus exhibits no maxillary sinus tenderness and no frontal sinus tenderness. Left sinus exhibits no maxillary sinus tenderness and no frontal sinus tenderness.  Mouth/Throat: Uvula is midline, oropharynx is clear and moist and mucous membranes are normal. No tonsillar exudate.  Eyes: Conjunctivae and EOM are normal. Pupils are equal, round, and reactive to light.  Cardiovascular: Regular rhythm and normal heart sounds. Tachycardia present.  Pulmonary/Chest: Effort normal and breath sounds normal.  Occasional dry cough noted  Lymphadenopathy:    She has cervical adenopathy.  Neurological: She is alert and  oriented to person, place, and time.  Skin: Skin is warm and dry.     UC Treatments / Results  Labs (all labs ordered are listed, but only abnormal results are displayed) Labs Reviewed - No data to display  EKG  EKG Interpretation None       Radiology No  results found.  Procedures Procedures (including critical care time)  Medications Ordered in UC Medications - No data to display   Initial Impression / Assessment and Plan / UC Course  I have reviewed the triage vital signs and the nursing notes.  Pertinent labs & imaging results that were available during my care of the patient were reviewed by me and considered in my medical decision making (see chart for details).     Benign physical findings. Non toxic in appearance. History and physical consistent with viral illness.  Supportive cares recommended. If symptoms worsen or do not improve in the next week to return to be seen or to follow up with PCP.  Patient verbalized understanding and agreeable to plan.    Final Clinical Impressions(s) / UC Diagnoses   Final diagnoses:  Viral URI with cough    ED Discharge Orders        Ordered    fluticasone (FLONASE) 50 MCG/ACT nasal spray  Daily     10/07/17 1712    sodium chloride (OCEAN) 0.65 % SOLN nasal spray  As needed     10/07/17 1712    guaiFENesin (MUCINEX) 600 MG 12 hr tablet  2 times daily     10/07/17 1712       Controlled Substance Prescriptions [place] Controlled Substance Registry consulted? Not Applicable   Zigmund Gottron, NP 10/07/17 [phone]

## 2017-10-07 NOTE — ED Triage Notes (Signed)
PT reports cough that is productive of green sputum, bilateral ear pain, congestion, drainage, and head pressure for 2 days.   PT reports nausea that she thinks is realted to drainage, she vomited twice yesterday.

## 2017-10-13 ENCOUNTER — Ambulatory Visit: Payer: 59 | Admitting: Obstetrics & Gynecology

## 2017-10-21 DIAGNOSIS — M2550 Pain in unspecified joint: Secondary | ICD-10-CM | POA: Diagnosis not present

## 2017-10-21 DIAGNOSIS — R768 Other specified abnormal immunological findings in serum: Secondary | ICD-10-CM | POA: Diagnosis not present

## 2017-12-17 DIAGNOSIS — N390 Urinary tract infection, site not specified: Secondary | ICD-10-CM | POA: Diagnosis not present

## 2017-12-17 DIAGNOSIS — R350 Frequency of micturition: Secondary | ICD-10-CM | POA: Diagnosis not present

## 2017-12-18 DIAGNOSIS — R350 Frequency of micturition: Secondary | ICD-10-CM | POA: Diagnosis not present

## 2018-02-14 ENCOUNTER — Ambulatory Visit (HOSPITAL_COMMUNITY)
Admission: EM | Admit: 2018-02-14 | Discharge: 2018-02-14 | Disposition: A | Discharge status: Home / Self Care | Payer: 59 | Attending: Family Medicine | Admitting: Family Medicine

## 2018-02-14 ENCOUNTER — Encounter (HOSPITAL_COMMUNITY): Payer: Self-pay | Admitting: Emergency Medicine

## 2018-02-14 DIAGNOSIS — R109 Unspecified abdominal pain: Secondary | ICD-10-CM | POA: Diagnosis present

## 2018-02-14 DIAGNOSIS — F17210 Nicotine dependence, cigarettes, uncomplicated: Secondary | ICD-10-CM | POA: Insufficient documentation

## 2018-02-14 DIAGNOSIS — Z882 Allergy status to sulfonamides status: Secondary | ICD-10-CM | POA: Insufficient documentation

## 2018-02-14 DIAGNOSIS — F329 Major depressive disorder, single episode, unspecified: Secondary | ICD-10-CM | POA: Insufficient documentation

## 2018-02-14 DIAGNOSIS — F419 Anxiety disorder, unspecified: Secondary | ICD-10-CM | POA: Insufficient documentation

## 2018-02-14 DIAGNOSIS — E049 Nontoxic goiter, unspecified: Secondary | ICD-10-CM | POA: Diagnosis not present

## 2018-02-14 DIAGNOSIS — Z79899 Other long term (current) drug therapy: Secondary | ICD-10-CM | POA: Insufficient documentation

## 2018-02-14 DIAGNOSIS — R350 Frequency of micturition: Secondary | ICD-10-CM | POA: Diagnosis present

## 2018-02-14 DIAGNOSIS — M069 Rheumatoid arthritis, unspecified: Secondary | ICD-10-CM | POA: Diagnosis not present

## 2018-02-14 DIAGNOSIS — N390 Urinary tract infection, site not specified: Secondary | ICD-10-CM | POA: Diagnosis not present

## 2018-02-14 DIAGNOSIS — Z87440 Personal history of urinary (tract) infections: Secondary | ICD-10-CM | POA: Diagnosis present

## 2018-02-14 LAB — POCT URINALYSIS DIP (DEVICE)
Bilirubin Urine: NEGATIVE
Glucose, UA: NEGATIVE mg/dL
Hgb urine dipstick: NEGATIVE
Ketones, ur: NEGATIVE mg/dL
Nitrite: NEGATIVE
Protein, ur: NEGATIVE mg/dL
Specific Gravity, Urine: 1.015 (ref 1.005–1.030)
Urobilinogen, UA: 0.2 mg/dL (ref 0.0–1.0)
pH: 7.5 (ref 5.0–8.0)

## 2018-02-14 LAB — POCT PREGNANCY, URINE: Preg Test, Ur: NEGATIVE

## 2018-02-14 MED ORDER — CIPROFLOXACIN HCL 500 MG PO TABS: 500.0000 mg | ORAL_TABLET | Freq: Two times a day (BID) | ORAL | 0 refills | Status: DC

## 2018-02-14 NOTE — ED Provider Notes (Signed)
[place]    CSN: [id number] Arrival date & time: 02/14/18  1941     History   Chief Complaint Chief Complaint  Patient presents with  . Urinary Tract Infection    HPI Eileen [name] is a 27 y.o. female.   Patient has history of urinary tract infection.  She has some frequency and left flank pain no fever chills nausea or vomiting.  She has had both pyelonephritis and cystitis in the past  HPI  Past Medical History:  Diagnosis Date  . Abortion 08/04/2011  . Anemia   . Anxiety   . Cholestasis during pregnancy   . Chronic kidney disease    kidney infection.  . Depression   . Dysuria   . Fibromyalgia   . Headache(784.0)   . Hypothyroid    history of.  . Mitral valve prolapse   . RA (rheumatoid arthritis) [facility])     Patient Active Problem List   Diagnosis Date Noted  . Lower urinary tract infectious disease 02/14/2018  . Encounter for sterilization   . Cholestasis during pregnancy in third trimester 10/21/2016  . Cholestasis during pregnancy, antepartum 09/30/2016  . Anemia affecting pregnancy 08/04/2016  . Back pain affecting pregnancy 08/04/2016  . Tachycardia 06/16/2016  . Supervision of normal pregnancy, antepartum 06/09/2016  . Rheumatoid arthritis ([place]) 04/07/2016  . Tobacco abuse 04/07/2016  . Depression with anxiety 11/01/2013  . Anxiety 08/01/2013  . Goiter, unspecified 06/06/2007    Past Surgical History:  Procedure Laterality Date  . DILATION AND CURETTAGE OF UTERUS    . LAPAROSCOPY N/A 05/25/2014   Procedure: LAPAROSCOPY DIAGNOSTIC;  Surgeon: Osborne Oman, MD;  Location: [facility];  Service: Gynecology;  Laterality: N/A;  . THERAPEUTIC ABORTION N/A 2012  . TUBAL LIGATION Bilateral 10/22/2016   Procedure: POST PARTUM TUBAL LIGATION;  Surgeon: Mora Bellman, MD;  Location: [facility];  Service: Gynecology;  Laterality: Bilateral;    OB History    Gravida  6   Para  4   Term  4   Preterm  0   AB  2   Living  4     SAB      TAB  2   Ectopic  0   Multiple  0   Live Births  4            Home Medications    Prior to Admission medications   Medication Sig Start Date End Date Taking? Authorizing Provider  clonazePAM (KLONOPIN) 0.5 MG tablet Take 0.5 mg by mouth 2 (two) times daily as needed for anxiety.   Yes [provider]  Acetaminophen (TYLENOL PO) Take 2 tablets by mouth daily as needed (pain).    [provider]  ALPRAZolam Duanne Moron) 0.5 MG tablet Take 0.5 mg by mouth 2 (two) times daily as needed for anxiety.     [provider]  ciprofloxacin (CIPRO) 500 MG tablet Take 1 tablet (500 mg total) by mouth every 12 (twelve) hours. 02/14/18   Wardell Honour, MD  DULoxetine (CYMBALTA) 30 MG capsule Take 30 mg by mouth daily.    [provider]  fluticasone (FLONASE) 50 MCG/ACT nasal spray Place 1 spray into both nostrils daily. 10/07/17   Zigmund Gottron, NP  guaiFENesin (MUCINEX) 600 MG 12 hr tablet Take 2 tablets (1,200 mg total) by mouth 2 (two) times daily. 10/07/17   Zigmund Gottron, NP  lurasidone (LATUDA) 20 MG TABS tablet Take 20 mg by mouth daily.  [provider]  sodium chloride (OCEAN) 0.65 % SOLN nasal spray Place 1 spray into both nostrils as needed for congestion. 10/07/17   Zigmund Gottron, NP    Family History Family History  Problem Relation Age of Onset  . Cancer Mother        SKIN  . Hyperlipidemia Mother   . Thyroid disease Mother   . Heart disease Mother   . Mitral valve prolapse Mother   . Heart attack Mother   . Asthma Brother   . Diabetes Maternal Grandmother   . Hyperlipidemia Maternal Grandmother   . Emphysema Paternal Grandfather     Social History Social History   Tobacco Use  . Smoking status: Current Every Day Smoker    Packs/day: 0.50    Types: Cigarettes  . Smokeless tobacco: Current User  Substance Use Topics  . Alcohol use: Yes    Alcohol/week: 0.0 oz    Comment: occasional but not while pregnant  .  Drug use: No     Allergies   Eggs or egg-derived products; Food; Red dye; and Sulfa antibiotics   Review of Systems Review of Systems  Constitutional: Negative for chills and fever.  HENT: Negative for ear pain and sore throat.   Eyes: Negative for pain and visual disturbance.  Respiratory: Negative for cough and shortness of breath.   Cardiovascular: Negative for chest pain and palpitations.  Gastrointestinal: Negative for abdominal pain and vomiting.  Genitourinary: Positive for dysuria and frequency. Negative for hematuria.  Musculoskeletal: Negative for arthralgias and back pain.  Skin: Negative for color change and rash.  Neurological: Negative for seizures and syncope.  All other systems reviewed and are negative.    Physical Exam Triage Vital Signs ED Triage Vitals [02/14/18 1956]  Enc Vitals Group     BP 107/72     Pulse Rate 94     Resp 16     Temp 98.8 F (37.1 C)     Temp Source Oral     SpO2 100 %     Weight      Height      Head Circumference      Peak Flow      Pain Score 5     Pain Loc      Pain Edu?      Excl. in [place]?    No data found.  Updated Vital Signs BP 107/72   Pulse 94   Temp 98.8 F (37.1 C) (Oral)   Resp 16   LMP 01/24/2018   SpO2 100%   Visual Acuity Right Eye Distance:   Left Eye Distance:   Bilateral Distance:    Right Eye Near:   Left Eye Near:    Bilateral Near:     Physical Exam  Constitutional: She appears well-developed and well-nourished.  Cardiovascular: Normal rate.  Pulmonary/Chest: Effort normal.  Abdominal:  Some left CVA tenderness as well as suprapubic tenderness with deep palpation     UC Treatments / Results  Labs (all labs ordered are listed, but only abnormal results are displayed) Labs Reviewed  POCT URINALYSIS DIP (DEVICE) - Abnormal; Notable for the following components:      Result Value   Leukocytes, UA SMALL (*)    All other components within normal limits  URINE CULTURE  POCT  PREGNANCY, URINE    EKG None  Radiology No results found.  Procedures Procedures (including critical care time)  Medications Ordered in UC Medications - No data to display  Initial Impression / Assessment and Plan / UC Course  I have reviewed the triage vital signs and the nursing notes.  Pertinent labs & imaging results that were available during my care of the patient were reviewed by me and considered in my medical decision making (see chart for details).     Urinary tract infection.  Will perform culture and start Cipro pending results of culture Final Clinical Impressions(s) / UC Diagnoses   Final diagnoses:  Lower urinary tract infectious disease   Discharge Instructions   None    ED Prescriptions    Medication Sig Dispense Auth. Provider   ciprofloxacin (CIPRO) 500 MG tablet Take 1 tablet (500 mg total) by mouth every 12 (twelve) hours. 10 tablet Wardell Honour, MD     Controlled Substance Prescriptions [place] Controlled Substance Registry consulted? No   Wardell Honour, MD 02/14/18 2026

## 2018-02-14 NOTE — ED Triage Notes (Signed)
Pt reports back pain and urinary frequency.

## 2018-02-16 LAB — URINE CULTURE

## 2018-02-23 ENCOUNTER — Encounter (HOSPITAL_COMMUNITY): Payer: Self-pay

## 2018-02-23 ENCOUNTER — Ambulatory Visit (HOSPITAL_COMMUNITY)
Admission: EM | Admit: 2018-02-23 | Discharge: 2018-02-23 | Disposition: A | Discharge status: Home / Self Care | Payer: 59 | Attending: Internal Medicine | Admitting: Internal Medicine

## 2018-02-23 DIAGNOSIS — R0789 Other chest pain: Secondary | ICD-10-CM | POA: Diagnosis not present

## 2018-02-23 DIAGNOSIS — Z882 Allergy status to sulfonamides status: Secondary | ICD-10-CM | POA: Insufficient documentation

## 2018-02-23 DIAGNOSIS — F419 Anxiety disorder, unspecified: Secondary | ICD-10-CM | POA: Diagnosis not present

## 2018-02-23 DIAGNOSIS — R0602 Shortness of breath: Secondary | ICD-10-CM | POA: Diagnosis not present

## 2018-02-23 DIAGNOSIS — I341 Nonrheumatic mitral (valve) prolapse: Secondary | ICD-10-CM | POA: Insufficient documentation

## 2018-02-23 DIAGNOSIS — Z79899 Other long term (current) drug therapy: Secondary | ICD-10-CM | POA: Insufficient documentation

## 2018-02-23 DIAGNOSIS — M069 Rheumatoid arthritis, unspecified: Secondary | ICD-10-CM | POA: Insufficient documentation

## 2018-02-23 DIAGNOSIS — F17210 Nicotine dependence, cigarettes, uncomplicated: Secondary | ICD-10-CM | POA: Diagnosis not present

## 2018-02-23 DIAGNOSIS — R079 Chest pain, unspecified: Secondary | ICD-10-CM | POA: Diagnosis present

## 2018-02-23 LAB — LIPID PANEL
CHOLESTEROL: 197 mg/dL (ref 0–200)
HDL: 38 mg/dL — AB (ref >40)
LDL Cholesterol: 151 mg/dL — ABNORMAL HIGH (ref 0–99)
TRIGLYCERIDES: 40 mg/dL (ref <150)
Total CHOL/HDL Ratio: 5.2 RATIO
VLDL: 8 mg/dL (ref 0–40)

## 2018-02-23 MED ORDER — DULOXETINE HCL 30 MG PO CPEP: 30.0000 mg | ORAL_CAPSULE | Freq: Every day | ORAL | 0 refills | Status: DC

## 2018-02-23 NOTE — Discharge Instructions (Addendum)
Chest discomfort likely anxiety related Cymbalta refilled Please work on getting set up with a new PCP  Follow up if symptoms worsening, chest pain not going away, chest pain different than normal, worsening shortness of breath

## 2018-02-23 NOTE — ED Triage Notes (Signed)
Pt presents chest pain, dizziness and SOB

## 2018-02-23 NOTE — ED Notes (Signed)
Blood drawn by Danne [place] at [place]

## 2018-02-24 LAB — HIV ANTIBODY (ROUTINE TESTING W REFLEX): HIV Screen 4th Generation wRfx: NONREACTIVE

## 2018-02-24 NOTE — ED Provider Notes (Signed)
[place]    CSN: [id number] Arrival date & time: 02/23/18  1747     History   Chief Complaint Chief Complaint  Patient presents with  . Chest Pain    HPI Eileen [name] is a 27 y.o. female history of tobacco use, rheumatoid arthritis, mitral valve prolapse,  anxiety presenting today for evaluation of an episode of chest pain, dizziness and shortness of breath.  Patient states that over the past couple of days she has had multiple episodes of stabbing chest pain to the center of her chest with associated shortness of breath and feeling as if she is going to pass out.  She typically takes a baby aspirin and symptoms resolve in approximately 30 minutes.  This happens multiple times a day.  Has become more frequent over the past week.  Denies increase in frequency with exertion, episodes will be random.  She denies associated nausea or vomiting.  She is also noticed to have more frequent headaches located at her left temple and having a tingling sensation in her jaw.  She denies chest discomfort at time of visit.  She states that she is also been increasingly stressed recently and she is also out of her anxiety medicines and bipolar medicines.  She is working on getting set up with a new PCP as her insurance changed.  She is really concerned as she has not had a lipid panel checked and they while, as well as concerned about possible HIV as her partner is getting tested for this, and has not found out the results.  Patient still is smoking cigarettes.  HPI  Past Medical History:  Diagnosis Date  . Abortion 08/04/2011  . Anemia   . Anxiety   . Cholestasis during pregnancy   . Chronic kidney disease    kidney infection.  . Depression   . Dysuria   . Fibromyalgia   . Headache(784.0)   . Hypothyroid    history of.  . Mitral valve prolapse   . RA (rheumatoid arthritis) [facility])     Patient Active Problem List   Diagnosis Date Noted  . Lower urinary tract infectious disease  02/14/2018  . Encounter for sterilization   . Cholestasis during pregnancy in third trimester 10/21/2016  . Cholestasis during pregnancy, antepartum 09/30/2016  . Anemia affecting pregnancy 08/04/2016  . Back pain affecting pregnancy 08/04/2016  . Tachycardia 06/16/2016  . Supervision of normal pregnancy, antepartum 06/09/2016  . Rheumatoid arthritis ([place]) 04/07/2016  . Tobacco abuse 04/07/2016  . Depression with anxiety 11/01/2013  . Anxiety 08/01/2013  . Goiter, unspecified 06/06/2007    Past Surgical History:  Procedure Laterality Date  . DILATION AND CURETTAGE OF UTERUS    . LAPAROSCOPY N/A 05/25/2014   Procedure: LAPAROSCOPY DIAGNOSTIC;  Surgeon: Osborne Oman, MD;  Location: [facility];  Service: Gynecology;  Laterality: N/A;  . THERAPEUTIC ABORTION N/A 2012  . TUBAL LIGATION Bilateral 10/22/2016   Procedure: POST PARTUM TUBAL LIGATION;  Surgeon: Mora Bellman, MD;  Location: [facility];  Service: Gynecology;  Laterality: Bilateral;    OB History    Gravida  6   Para  4   Term  4   Preterm  0   AB  2   Living  4     SAB      TAB  2   Ectopic  0   Multiple  0   Live Births  4            Home Medications  Prior to Admission medications   Medication Sig Start Date End Date Taking? Authorizing Provider  Acetaminophen (TYLENOL PO) Take 2 tablets by mouth daily as needed (pain).    [provider]  ALPRAZolam Duanne Moron) 0.5 MG tablet Take 0.5 mg by mouth 2 (two) times daily as needed for anxiety.     [provider]  ciprofloxacin (CIPRO) 500 MG tablet Take 1 tablet (500 mg total) by mouth every 12 (twelve) hours. 02/14/18   Wardell Honour, MD  clonazePAM (KLONOPIN) 0.5 MG tablet Take 0.5 mg by mouth 2 (two) times daily as needed for anxiety.    [provider]  DULoxetine (CYMBALTA) 30 MG capsule Take 1 capsule (30 mg total) by mouth daily. 02/23/18   Wieters, Hallie C, PA-C  fluticasone (FLONASE) 50 MCG/ACT nasal spray Place 1  spray into both nostrils daily. 10/07/17   Zigmund Gottron, NP  guaiFENesin (MUCINEX) 600 MG 12 hr tablet Take 2 tablets (1,200 mg total) by mouth 2 (two) times daily. 10/07/17   Zigmund Gottron, NP  lurasidone (LATUDA) 20 MG TABS tablet Take 20 mg by mouth daily.    [provider]  sodium chloride (OCEAN) 0.65 % SOLN nasal spray Place 1 spray into both nostrils as needed for congestion. 10/07/17   Zigmund Gottron, NP    Family History Family History  Problem Relation Age of Onset  . Cancer Mother        SKIN  . Hyperlipidemia Mother   . Thyroid disease Mother   . Heart disease Mother   . Mitral valve prolapse Mother   . Heart attack Mother   . Asthma Brother   . Diabetes Maternal Grandmother   . Hyperlipidemia Maternal Grandmother   . Emphysema Paternal Grandfather     Social History Social History   Tobacco Use  . Smoking status: Current Every Day Smoker    Packs/day: 0.50    Types: Cigarettes  . Smokeless tobacco: Current User  Substance Use Topics  . Alcohol use: Yes    Alcohol/week: 0.0 oz    Comment: occasional but not while pregnant  . Drug use: No     Allergies   Eggs or egg-derived products; Food; Red dye; and Sulfa antibiotics   Review of Systems Review of Systems  Constitutional: Negative for fatigue and fever.  HENT: Negative for congestion, sinus pressure and sore throat.   Eyes: Negative for photophobia, pain and visual disturbance.  Respiratory: Positive for shortness of breath. Negative for cough.   Cardiovascular: Positive for chest pain.  Gastrointestinal: Negative for abdominal pain, nausea and vomiting.  Genitourinary: Negative for decreased urine volume and hematuria.  Musculoskeletal: Negative for myalgias, neck pain and neck stiffness.  Neurological: Positive for light-headedness and headaches. Negative for dizziness, syncope, facial asymmetry, speech difficulty, weakness and numbness.     Physical Exam Triage Vital Signs ED  Triage Vitals  Enc Vitals Group     BP 02/23/18 1827 106/70     Pulse Rate 02/23/18 1827 95     Resp 02/23/18 1827 20     Temp 02/23/18 1827 98.4 F (36.9 C)     Temp Source 02/23/18 1827 Temporal     SpO2 02/23/18 1827 100 %     Weight --      Height --      Head Circumference --      Peak Flow --      Pain Score 02/23/18 1823 5     Pain Loc --  Pain Edu? --      Excl. in [place]? --    No data found.  Updated Vital Signs BP 106/70 (BP Location: Left Arm)   Pulse 95   Temp 98.4 F (36.9 C) (Temporal)   Resp 20   LMP 01/24/2018   SpO2 100%   Visual Acuity Right Eye Distance:   Left Eye Distance:   Bilateral Distance:    Right Eye Near:   Left Eye Near:    Bilateral Near:     Physical Exam  Constitutional: She is oriented to person, place, and time. She appears well-developed and well-nourished. No distress.  HENT:  Head: Normocephalic and atraumatic.  Mouth/Throat: Oropharynx is clear and moist.  Eyes: Pupils are equal, round, and reactive to light. Conjunctivae and EOM are normal.  Neck: Neck supple.  Cardiovascular: Normal rate and regular rhythm.  No murmur heard. No carotid bruits auscultated bilaterally  Pulmonary/Chest: Effort normal and breath sounds normal. No respiratory distress.  Chest discomfort not reproducible on palpation of anterior chest bilaterally  Abdominal: Soft. There is no tenderness.  Musculoskeletal: She exhibits no edema.  Neurological: She is alert and oriented to person, place, and time.  Patient A&O x3, cranial nerves II-XII grossly intact, strength at shoulders, hips and knees 5/5, equal bilaterally, patellar reflex 2+ bilaterally.Negative Romberg and Pronator Drift. Gait without abnormality.  Skin: Skin is warm and dry.  Psychiatric: She has a normal mood and affect.  Nursing note and vitals reviewed.    UC Treatments / Results  Labs (all labs ordered are listed, but only abnormal results are displayed) Labs Reviewed  LIPID  PANEL - Abnormal; Notable for the following components:      Result Value   HDL 38 (*)    LDL Cholesterol 151 (*)    All other components within normal limits  HIV ANTIBODY (ROUTINE TESTING)    EKG None  Radiology No results found.  Procedures Procedures (including critical care time)  Medications Ordered in UC Medications - No data to display  Initial Impression / Assessment and Plan / UC Course  I have reviewed the triage vital signs and the nursing notes.  Pertinent labs & imaging results that were available during my care of the patient were reviewed by me and considered in my medical decision making (see chart for details).     EKG normal sinus rhythm, no acute signs of ischemia or infarction.  No arrhythmias noted.  Patient no longer with symptoms previously experiencing.  Vital signs stable.  Exam unremarkable.  Will recommend outpatient follow-up, discussed focusing on getting set up with a new PCP, provided to primary care's to attempt to get set up with-also in order to reestablish her bipolar/anxiety management..  Symptoms most likely related to anxiety,, but discussed with patient her increased risk of PE/DVT given her smoking history.  Patient concerned about lipids, will go ahead and draw this as well as HIV, per her request.  Discussed going to the emergency room if symptoms worsening, chest pain not going away, chest pain different than normal.  Patient stable upon discharge. Final Clinical Impressions(s) / UC Diagnoses   Final diagnoses:  Atypical chest pain     Discharge Instructions     Chest discomfort likely anxiety related Cymbalta refilled Please work on getting set up with a new PCP  Follow up if symptoms worsening, chest pain not going away, chest pain different than normal, worsening shortness of breath   ED Prescriptions    Medication Sig  Dispense Auth. Provider   DULoxetine (CYMBALTA) 30 MG capsule Take 1 capsule (30 mg total) by mouth daily.  30 capsule Wieters, Hallie C, PA-C     Controlled Substance Prescriptions [place] Controlled Substance Registry consulted? Not Applicable   Janith Lima, [place] 02/24/18 0800

## 2018-03-08 NOTE — Progress Notes (Signed)
Chief Complaint  Patient presents with  . New Patient (Initial Visit)    needs labwork done check thyroid and iron- no labs in awhile  . Medication Refill    latuda, klonopin    HPI  Pt reports that she is here to establish care  Bipolar  She has a history of anxiety and depression and was diagnosed with bipolar disorder mixed but is mostly depressed She states that she was started on Latuda and Klonopin Her PCP used to manage her primary care and psychiatry but she changed offices and now needs a primary care and referral to psychiatry She denies SI  Depression screen [facility] 2/9 03/09/2018  Decreased Interest 3  Down, Depressed, Hopeless 3  PHQ - 2 Score 6  Altered sleeping 2  Tired, decreased energy 3  Change in appetite 3  Feeling bad or failure about yourself  3  Trouble concentrating 2  Moving slowly or fidgety/restless 1  Suicidal thoughts 1  PHQ-9 Score 21  Difficult doing work/chores Very difficult   She reports that she has been stable on the [place] and has an appt with Psychiatry next month but does not want to run out of her meds No recent hospitalizations for psych  Unintentional Weight Loss She reports that she has been losing weight and would like to get some labs to see why She is a mother of 4 and reports that she does not know why she is losing weight She does not diet She eats regular meals She has a history of anemia  Fibromyalgia and RA She reports a history of Rheumatoid arthritis She is on Cymbalta to help with pain and her moods She reports that she also has fibromyalgia She uses some topical rubs for her pain at times  Past Medical History:  Diagnosis Date  . Abortion 08/04/2011  . Anemia   . Anxiety   . Cholestasis during pregnancy   . Chronic kidney disease    kidney infection.  . Depression   . Dysuria   . Fibromyalgia   . Fibromyalgia   . Headache(784.0)   . Hypothyroid    history of.  . Mitral valve prolapse   . Neuropathy   .  RA (rheumatoid arthritis) ([facility])     Current Outpatient Medications  Medication Sig Dispense Refill  . Acetaminophen (TYLENOL PO) Take 2 tablets by mouth daily as needed (pain).    . clonazePAM (KLONOPIN) 0.5 MG tablet Take 1 tablet (0.5 mg total) by mouth 2 (two) times daily as needed for anxiety. 30 tablet 1  . DULoxetine (CYMBALTA) 30 MG capsule Take 1 capsule (30 mg total) by mouth daily. 30 capsule 0  . lurasidone (LATUDA) 20 MG TABS tablet Take 1 tablet (20 mg total) by mouth daily. 30 tablet 1  . ALPRAZolam (XANAX) 0.5 MG tablet Take 0.5 mg by mouth 2 (two) times daily as needed for anxiety.     . ciprofloxacin (CIPRO) 500 MG tablet Take 1 tablet (500 mg total) by mouth every 12 (twelve) hours. (Patient not taking: Reported on 03/09/2018) 10 tablet 0  . fluticasone (FLONASE) 50 MCG/ACT nasal spray Place 1 spray into both nostrils daily. (Patient not taking: Reported on 03/09/2018) 16 g 2  . guaiFENesin (MUCINEX) 600 MG 12 hr tablet Take 2 tablets (1,200 mg total) by mouth 2 (two) times daily. (Patient not taking: Reported on 03/09/2018) 20 tablet 0  . sodium chloride (OCEAN) 0.65 % SOLN nasal spray Place 1 spray into both nostrils as needed  for congestion. (Patient not taking: Reported on 03/09/2018) 60 mL 0   No current facility-administered medications for this visit.     Allergies:  Allergies  Allergen Reactions  . Eggs Or Egg-Derived Products Other (See Comments)    Stomach pain  . Food Hives, Nausea And Vomiting and Other (See Comments)    Reaction to green beans   . Red Dye Nausea And Vomiting  . Sulfa Antibiotics Hives and Nausea And Vomiting    Past Surgical History:  Procedure Laterality Date  . DILATION AND CURETTAGE OF UTERUS    . LAPAROSCOPY N/A 05/25/2014   Procedure: LAPAROSCOPY DIAGNOSTIC;  Surgeon: Osborne Oman, MD;  Location: [facility];  Service: Gynecology;  Laterality: N/A;  . THERAPEUTIC ABORTION N/A 2012  . TUBAL LIGATION Bilateral 10/22/2016   Procedure:  POST PARTUM TUBAL LIGATION;  Surgeon: Mora Bellman, MD;  Location: [facility];  Service: Gynecology;  Laterality: Bilateral;    Social History   Socioeconomic History  . Marital status: Single    Spouse name: Not on file  . Number of children: Not on file  . Years of education: Not on file  . Highest education level: Not on file  Occupational History  . Not on file  Social Needs  . Financial resource strain: Not on file  . Food insecurity:    Worry: Not on file    Inability: Not on file  . Transportation needs:    Medical: Not on file    Non-medical: Not on file  Tobacco Use  . Smoking status: Current Every Day Smoker    Packs/day: 1.00    Years: 8.00    Pack years: 8.00    Types: Cigarettes  . Smokeless tobacco: Current User  Substance and Sexual Activity  . Alcohol use: Yes    Alcohol/week: 0.0 standard drinks    Comment: occasional but not while pregnant  . Drug use: No  . Sexual activity: Yes    Partners: Male    Birth control/protection: Surgical    Comment: tubal  Lifestyle  . Physical activity:    Days per week: Not on file    Minutes per session: Not on file  . Stress: Not on file  Relationships  . Social connections:    Talks on phone: Not on file    Gets together: Not on file    Attends religious service: Not on file    Active member of club or organization: Not on file    Attends meetings of clubs or organizations: Not on file    Relationship status: Not on file  Other Topics Concern  . Not on file  Social History Narrative  . Not on file    Family History  Problem Relation Age of Onset  . Cancer Mother        SKIN  . Hyperlipidemia Mother   . Thyroid disease Mother   . Heart disease Mother   . Mitral valve prolapse Mother   . Heart attack Mother   . Asthma Brother   . Bipolar disorder Brother   . Diabetes Maternal Grandmother   . Hyperlipidemia Maternal Grandmother   . Emphysema Paternal Grandfather      ROS Review of Systems See  HPI Constitution: No fevers or chills No malaise No diaphoresis Skin: No rash or itching Eyes: no blurry vision, no double vision GU: no dysuria or hematuria Neuro: no dizziness or headaches all others reviewed and negative   Objective: Vitals:   03/09/18 0836  BP: 108/70  Pulse: (!) 124  Resp: 17  Temp: 98.8 F (37.1 C)  TempSrc: Oral  SpO2: 100%  Weight: 100 lb 12.8 oz (45.7 kg)  Height: 4\' 11"  (1.499 m)   Wt Readings from Last 3 Encounters:  03/09/18 100 lb 12.8 oz (45.7 kg)  10/07/17 103 lb (46.7 kg)  04/06/17 99 lb 9.6 oz (45.2 kg)    Physical Exam  Constitutional: She is oriented to person, place, and time. She appears well-developed and well-nourished.  HENT:  Head: Normocephalic and atraumatic.  Eyes: Conjunctivae and EOM are normal.  Neck: Normal range of motion. Neck supple. No thyromegaly present.  Cardiovascular: Normal rate, regular rhythm and normal heart sounds.  No murmur heard. Pulmonary/Chest: Effort normal and breath sounds normal. No stridor. No respiratory distress.  Neurological: She is alert and oriented to person, place, and time.  Skin: Skin is warm. Capillary refill takes less than 2 seconds.  Psychiatric: She has a normal mood and affect. Her behavior is normal. Judgment and thought content normal.     Assessment and Plan [place] was seen today for new patient (initial visit) and medication refill.  Diagnoses and all orders for this visit:  Depression with anxiety- will screen for metabolic underlying risk factors -     TSH + free T4 -     Ambulatory referral to Psychiatry  Rheumatoid arthritis of multiple sites with negative rheumatoid factor ([place])- will check for signs of inflammation Reviewed labs from careeverywhere and her RA was RF negative at the time of the work up -     Comprehensive metabolic panel -     CBC with Differential/Platelet  Other iron deficiency anemia- discussed her trend and will review iron panel as well -      CBC with Differential/Platelet -     Iron, TIBC and Ferritin Panel  Bipolar disorder, in full remission, most recent episode mixed ([place])- stable currently, referred to Psychiatry -     Ambulatory referral to Psychiatry  Loss of weight- discussed that she should eat more frequent meals, add exercise and calorie dense snacks  Other orders -     clonazePAM (KLONOPIN) 0.5 MG tablet; Take 1 tablet (0.5 mg total) by mouth 2 (two) times daily as needed for anxiety. -     lurasidone (LATUDA) 20 MG TABS tablet; Take 1 tablet (20 mg total) by mouth daily.  A total of 30 minutes were spent face-to-face with the patient during this encounter and over half of that time was spent on counseling and coordination of care.  pmp aware reviewed and deemed appropriate Reviewed previous provider's notes and labs in [place]

## 2018-03-09 ENCOUNTER — Ambulatory Visit (INDEPENDENT_AMBULATORY_CARE_PROVIDER_SITE_OTHER): Payer: 59 | Admitting: Family Medicine

## 2018-03-09 ENCOUNTER — Other Ambulatory Visit: Payer: Self-pay

## 2018-03-09 ENCOUNTER — Encounter: Payer: Self-pay | Admitting: Family Medicine

## 2018-03-09 VITALS — BP 108/70 | HR 124 | Temp 98.8°F | Resp 17 | Ht 59.0 in | Wt 100.8 lb

## 2018-03-09 DIAGNOSIS — D508 Other iron deficiency anemias: Secondary | ICD-10-CM | POA: Diagnosis not present

## 2018-03-09 DIAGNOSIS — F418 Other specified anxiety disorders: Secondary | ICD-10-CM

## 2018-03-09 DIAGNOSIS — F3178 Bipolar disorder, in full remission, most recent episode mixed: Secondary | ICD-10-CM

## 2018-03-09 DIAGNOSIS — M0609 Rheumatoid arthritis without rheumatoid factor, multiple sites: Secondary | ICD-10-CM | POA: Diagnosis not present

## 2018-03-09 DIAGNOSIS — R634 Abnormal weight loss: Secondary | ICD-10-CM

## 2018-03-09 MED ORDER — CLONAZEPAM 0.5 MG PO TABS: 0.5000 mg | ORAL_TABLET | Freq: Two times a day (BID) | ORAL | 1 refills | Status: DC | PRN

## 2018-03-09 MED ORDER — LURASIDONE HCL 20 MG PO TABS: 20.0000 mg | ORAL_TABLET | Freq: Every day | ORAL | 1 refills | Status: DC

## 2018-03-09 NOTE — Patient Instructions (Addendum)
I will contact you with your lab results within the next 2 weeks.  If you have not heard from [place] then please contact us. The fastest way to get your results is to register for My Chart.   IF you received an x-ray today, you will receive an invoice from [facility]. Please contact [facility] at [phone] with questions or concerns regarding your invoice.   IF you received labwork today, you will receive an invoice from [place]. Please contact [facility] at [phone] with questions or concerns regarding your invoice.   Our billing staff will not be able to assist you with questions regarding bills from these companies.  You will be contacted with the lab results as soon as they are available. The fastest way to get your results is to activate your My Chart account. Instructions are located on the last page of this paperwork. If you have not heard from [place] regarding the results in 2 weeks, please contact this office.     Iron Deficiency Anemia, Adult Iron deficiency anemia is a condition in which the concentration of red blood cells or hemoglobin in the blood is below normal because of too little iron. Hemoglobin is a substance in red blood cells that carries oxygen to the body's tissues. When the concentration of red blood cells or hemoglobin is too low, not enough oxygen reaches these tissues. Iron deficiency anemia is usually long-lasting (chronic) and it develops over time. It may or may not cause symptoms. It is a common type of anemia. What are the causes? This condition may be caused by:  Not enough iron in the diet.  Blood loss caused by bleeding in the intestine.  Blood loss from a gastrointestinal condition like Crohn disease.  Frequent blood draws, such as from blood donation.  Abnormal absorption in the gut.  Heavy menstrual periods in women.  Cancers of the gastrointestinal system, such as colon cancer.  What are the signs or  symptoms? Symptoms of this condition may include:  Fatigue.  Headache.  Pale skin, lips, and nail beds.  Poor appetite.  Weakness.  Shortness of breath.  Dizziness.  Cold hands and feet.  Fast or irregular heartbeat.  Irritability. This is more common in severe anemia.  Rapid breathing. This is more common in severe anemia.  Mild anemia may not cause any symptoms. How is this diagnosed? This condition is diagnosed based on:  Your medical history.  A physical exam.  Blood tests.  You may have additional tests to find the underlying cause of your anemia, such as:  Testing for blood in the stool (fecal occult blood test).  A procedure to see inside your colon and rectum (colonoscopy).  A procedure to see inside your esophagus and stomach (endoscopy).  A test in which cells are removed from bone marrow (bone marrow aspiration) or fluid is removed from the bone marrow to be examined (biopsy). This is rarely needed.  How is this treated? This condition is treated by correcting the cause of your iron deficiency. Treatment may involve:  Adding iron-rich foods to your diet.  Taking iron supplements. If you are pregnant or breastfeeding, you may need to take extra iron because your normal diet usually does not provide the amount of iron that you need.  Increasing vitamin C intake. Vitamin C helps your body absorb iron. Your health care provider may recommend that you take iron supplements along with a glass of orange juice or a vitamin C supplement.  Medicines to make heavy menstrual flow lighter.  Surgery.  You may need repeat blood tests to determine whether treatment is working. Depending on the underlying cause, the anemia should be corrected within 2 months of starting treatment. If the treatment does not seem to be working, you may need more testing. Follow these instructions at home: Medicines  Take over-the-counter and prescription medicines only as told by  your health care provider. This includes iron supplements and vitamins.  If you cannot tolerate taking iron supplements by mouth, talk with your health care provider about taking them through a vein (intravenously) or an injection into a muscle.  For the best iron absorption, you should take iron supplements when your stomach is empty. If you cannot tolerate them on an empty stomach, you may need to take them with food.  Do not drink milk or take antacids at the same time as your iron supplements. Milk and antacids may interfere with iron absorption.  Iron supplements can cause constipation. To prevent constipation, include fiber in your diet as told by your health care provider. A stool softener may also be recommended. Eating and drinking  Talk with your health care provider before changing your diet. He or she may recommend that you eat foods that contain a lot of iron, such as: ? Liver. ? Low-fat (lean) beef. ? Breads and cereals that have iron added to them (are fortified). ? Eggs. ? Dried fruit. ? Dark green, leafy vegetables.  To help your body use the iron from iron-rich foods, eat those foods at the same time as fresh fruits and vegetables that are high in vitamin C. Foods that are high in vitamin C include: ? Oranges. ? Peppers. ? Tomatoes. ? Mangoes.  Drinkenoughfluid to keep your urine clear or pale yellow. General instructions  Return to your normal activities as told by your health care provider. Ask your health care provider what activities are safe for you.  Practice good hygiene. Anemia can make you more prone to illness and infection.  Keep all follow-up visits as told by your health care provider. This is important. Contact a health care provider if:  You feel nauseous or you vomit.  You feel weak.  You have unexplained sweating.  You develop symptoms of constipation, such as: ? Having fewer than three bowel movements a week. ? Straining to have a bowel  movement. ? Having stools that are hard, dry, or larger than normal. ? Feeling full or bloated. ? Pain in the lower abdomen. ? Not feeling relief after having a bowel movement. Get help right away if:  You faint. If this happens, do not drive yourself to the hospital. Call your local emergency services (911 in the [place]  You have chest pain.  You have shortness of breath that: ? Is severe. ? Gets worse with physical activity.  You have a rapid heartbeat.  You become light-headed when getting up from a sitting or lying down position. This information is not intended to replace advice given to you by your health care provider. Make sure you discuss any questions you have with your health care provider. Document Released: 07/10/2000 Document Revised: 04/01/2016 Document Reviewed: 04/01/2016 Elsevier Interactive Patient Education  2018 [employer].

## 2018-03-10 LAB — COMPREHENSIVE METABOLIC PANEL
ALBUMIN: 4.8 g/dL (ref 3.5–5.5)
ALK PHOS: 61 IU/L (ref 39–117)
ALT: 9 IU/L (ref 0–32)
AST: 18 IU/L (ref 0–40)
Albumin/Globulin Ratio: 1.8 (ref 1.2–2.2)
BUN / CREAT RATIO: 12 (ref 9–23)
BUN: 9 mg/dL (ref 6–20)
Bilirubin Total: 0.3 mg/dL (ref 0.0–1.2)
CO2: 22 mmol/L (ref 20–29)
CREATININE: 0.77 mg/dL (ref 0.57–1.00)
Calcium: 9.6 mg/dL (ref 8.7–10.2)
Chloride: 103 mmol/L (ref 96–106)
GFR, EST AFRICAN AMERICAN: 122 mL/min/{1.73_m2} (ref >59)
GFR, EST NON AFRICAN AMERICAN: 106 mL/min/{1.73_m2} (ref >59)
GLOBULIN, TOTAL: 2.7 g/dL (ref 1.5–4.5)
Glucose: 76 mg/dL (ref 65–99)
Potassium: 4.4 mmol/L (ref 3.5–5.2)
Sodium: 139 mmol/L (ref 134–144)
TOTAL PROTEIN: 7.5 g/dL (ref 6.0–8.5)

## 2018-03-10 LAB — CBC WITH DIFFERENTIAL/PLATELET
Basophils Absolute: 0 10*3/uL (ref 0.0–0.2)
Basos: 1 %
EOS (ABSOLUTE): 0.2 10*3/uL (ref 0.0–0.4)
EOS: 4 %
HEMATOCRIT: 32.3 % — AB (ref 34.0–46.6)
HEMOGLOBIN: 9.5 g/dL — AB (ref 11.1–15.9)
Immature Grans (Abs): 0 10*3/uL (ref 0.0–0.1)
Immature Granulocytes: 0 %
LYMPHS ABS: 1.8 10*3/uL (ref 0.7–3.1)
Lymphs: 36 %
MCH: 20.1 pg — AB (ref 26.6–33.0)
MCHC: 29.4 g/dL — ABNORMAL LOW (ref 31.5–35.7)
MCV: 68 fL — AB (ref 79–97)
MONOS ABS: 0.5 10*3/uL (ref 0.1–0.9)
Monocytes: 9 %
NEUTROS ABS: 2.5 10*3/uL (ref 1.4–7.0)
Neutrophils: 50 %
Platelets: 251 10*3/uL (ref 150–450)
RBC: 4.73 x10E6/uL (ref 3.77–5.28)
RDW: 19.1 % — AB (ref 12.3–15.4)
WBC: 5.1 10*3/uL (ref 3.4–10.8)

## 2018-03-10 LAB — IRON,TIBC AND FERRITIN PANEL
FERRITIN: 3 ng/mL — AB (ref 15–150)
Iron Saturation: 2 % — CL (ref 15–55)
Iron: 10 ug/dL — ABNORMAL LOW (ref 27–159)
Total Iron Binding Capacity: 424 ug/dL (ref 250–450)
UIBC: 414 ug/dL (ref 131–425)

## 2018-03-10 LAB — TSH+FREE T4
FREE T4: 1.06 ng/dL (ref 0.82–1.77)
TSH: 6.59 u[IU]/mL — AB (ref 0.450–4.500)

## 2018-03-16 ENCOUNTER — Telehealth: Payer: Self-pay | Admitting: Family Medicine

## 2018-03-16 NOTE — Telephone Encounter (Signed)
Copied from [place] [id number]. Topic: Quick Communication - See Telephone Encounter >> Mar 16, 2018 12:58 PM Bea Graff, NT wrote: CRM for notification. See Telephone encounter for: 03/16/18. Pt would like a call to discuss her lab results and possible medication needed.

## 2018-03-17 NOTE — Telephone Encounter (Signed)
Pt following up on her results.  She is very concerned about her tsh and iron levels. Pt states she is feeling very tired, been getting dizzy, headaches, muscle pain. Pt states if she is going to be put on meds, please do asap.  Pt states she works 40 hrs a week and does not want to fall out at work.   [id number] [address], [place]. [phone] (Phone) [phone] (Fax)

## 2018-03-18 NOTE — Telephone Encounter (Signed)
Please let the patient know that she has low iron, iron deficiency anemia.  Her thyroid free t4 was normal.  I would recommend iron supplement, slow Fe, from the pharmacy to take twice a day She pick this up locally. There is less constipation and upset stomach with slow Fe. We can recheck her levels in 3 month.    I sent her information to mychart.

## 2018-03-21 NOTE — Telephone Encounter (Signed)
Unable to reach patient mailbox full

## 2018-03-22 ENCOUNTER — Telehealth: Payer: Self-pay | Admitting: Family Medicine

## 2018-03-22 DIAGNOSIS — D508 Other iron deficiency anemias: Secondary | ICD-10-CM

## 2018-03-22 DIAGNOSIS — R7989 Other specified abnormal findings of blood chemistry: Secondary | ICD-10-CM

## 2018-03-22 NOTE — Telephone Encounter (Signed)
Copied from [place] [phone]. Topic: Quick Communication - See Telephone Encounter >> Mar 22, 2018 12:29 PM Neva Seat wrote: Pt has hashimotos thyroid level is very high and pt needs to discuss starting medication.

## 2018-03-31 NOTE — Telephone Encounter (Signed)
Please let her know that if the TSH is less than 10 we do not have to start medications.  I will order her labs and have her return to get them checked in 3 more weeks. After getting the blood test done at a lab appt she should come in for an office visit in 2 days so I can discuss it and start a medication if necessary.  I will also explain all the labs in great detail.

## 2018-03-31 NOTE — Addendum Note (Signed)
Addended by: Delia Chimes A on: 03/31/2018 07:16 PM   Modules accepted: Orders

## 2018-04-01 NOTE — Telephone Encounter (Signed)
Tried contacting pt at [phone] no answer and voicemail full. Dgaddy, CMA

## 2018-04-01 NOTE — Telephone Encounter (Signed)
Tried calling pt at [phone] an advised by gentleman who answered phone that [name] didn't live there. Dgaddy, CMA

## 2018-04-04 NOTE — Telephone Encounter (Signed)
Called and informed pt of labs. 

## 2018-04-14 ENCOUNTER — Telehealth: Payer: Self-pay | Admitting: Family Medicine

## 2018-04-14 DIAGNOSIS — R7989 Other specified abnormal findings of blood chemistry: Secondary | ICD-10-CM

## 2018-04-14 NOTE — Telephone Encounter (Signed)
Copied from [place] [id number]. Topic: Referral - Request >> Apr 14, 2018 11:22 AM Margot Ables wrote: Reason for CRM: Pt is requesting a referral to Endocrinology. She used to see Dr. Ronnald Collum before. Please notify when this has been sent so she is able to make an appointment. [place], Dr. Karlene Einstein Address: [address], [place], [place] [address] Phone: [phone]  Pt states she was supposed to have a referral to psychiatry at her last OV and she has not heard from anyone. I am unable to see psychiatry referral. Please call pt. PLEASE leave a voicemail - pts job does not have a signal and phone doesn't ring in the building.

## 2018-04-18 NOTE — Telephone Encounter (Signed)
Pt would like a referral for Endocrinology, please advise

## 2018-04-22 NOTE — Telephone Encounter (Signed)
The referral for Psychiatry was placed on 03/09/18.  I will route to the referral team.    I placed the referral for Endocrinology per her request.  I already ordered her repeat thyroid test and would advise that she get this drawn ASAP so this information can be sent to the Endocrinologist with the referral.

## 2018-05-11 DIAGNOSIS — H00025 Hordeolum internum left lower eyelid: Secondary | ICD-10-CM | POA: Diagnosis not present

## 2018-05-11 DIAGNOSIS — H40033 Anatomical narrow angle, bilateral: Secondary | ICD-10-CM | POA: Diagnosis not present

## 2018-05-13 ENCOUNTER — Encounter (HOSPITAL_COMMUNITY): Payer: Self-pay | Admitting: Emergency Medicine

## 2018-05-13 ENCOUNTER — Emergency Department (HOSPITAL_COMMUNITY)
Admission: EM | Admit: 2018-05-13 | Discharge: 2018-05-14 | Disposition: A | Discharge status: Home / Self Care | Payer: 59 | Source: Home / Self Care | Attending: Emergency Medicine | Admitting: Emergency Medicine

## 2018-05-13 ENCOUNTER — Emergency Department (HOSPITAL_COMMUNITY)
Admission: EM | Admit: 2018-05-13 | Discharge: 2018-05-13 | Disposition: A | Discharge status: Home / Self Care | Payer: 59 | Attending: Emergency Medicine | Admitting: Emergency Medicine

## 2018-05-13 ENCOUNTER — Other Ambulatory Visit: Payer: Self-pay

## 2018-05-13 DIAGNOSIS — D649 Anemia, unspecified: Secondary | ICD-10-CM

## 2018-05-13 DIAGNOSIS — E039 Hypothyroidism, unspecified: Secondary | ICD-10-CM | POA: Insufficient documentation

## 2018-05-13 DIAGNOSIS — Z79899 Other long term (current) drug therapy: Secondary | ICD-10-CM | POA: Insufficient documentation

## 2018-05-13 DIAGNOSIS — F17210 Nicotine dependence, cigarettes, uncomplicated: Secondary | ICD-10-CM

## 2018-05-13 DIAGNOSIS — N189 Chronic kidney disease, unspecified: Secondary | ICD-10-CM

## 2018-05-13 DIAGNOSIS — R531 Weakness: Secondary | ICD-10-CM | POA: Diagnosis present

## 2018-05-13 LAB — CBC WITH DIFFERENTIAL/PLATELET
Abs Immature Granulocytes: 0.02 10*3/uL (ref 0.00–0.07)
Basophils Absolute: 0.1 10*3/uL (ref 0.0–0.1)
Basophils Relative: 1 %
Eosinophils Absolute: 0.3 10*3/uL (ref 0.0–0.5)
Eosinophils Relative: 4 %
HCT: 32.1 % — ABNORMAL LOW (ref 36.0–46.0)
Hemoglobin: 8.9 g/dL — ABNORMAL LOW (ref 12.0–15.0)
Immature Granulocytes: 0 %
Lymphocytes Relative: 44 %
Lymphs Abs: 3.1 10*3/uL (ref 0.7–4.0)
MCH: 19.5 pg — ABNORMAL LOW (ref 26.0–34.0)
MCHC: 27.7 g/dL — ABNORMAL LOW (ref 30.0–36.0)
MCV: 70.4 fL — ABNORMAL LOW (ref 80.0–100.0)
Monocytes Absolute: 0.6 10*3/uL (ref 0.1–1.0)
Monocytes Relative: 8 %
Neutro Abs: 3 10*3/uL (ref 1.7–7.7)
Neutrophils Relative %: 43 %
Platelets: 233 10*3/uL (ref 150–400)
RBC: 4.56 MIL/uL (ref 3.87–5.11)
RDW: 18.1 % — ABNORMAL HIGH (ref 11.5–15.5)
WBC: 7.1 10*3/uL (ref 4.0–10.5)
nRBC: 0 % (ref 0.0–0.2)

## 2018-05-13 LAB — BASIC METABOLIC PANEL
Anion gap: 7 (ref 5–15)
BUN: 11 mg/dL (ref 6–20)
CO2: 24 mmol/L (ref 22–32)
Calcium: 9.8 mg/dL (ref 8.9–10.3)
Chloride: 107 mmol/L (ref 98–111)
Creatinine, Ser: 0.75 mg/dL (ref 0.44–1.00)
GFR calc Af Amer: >60 mL/min (ref >60)
GFR calc non Af Amer: >60 mL/min (ref >60)
Glucose, Bld: 95 mg/dL (ref 70–99)
Potassium: 4.3 mmol/L (ref 3.5–5.1)
Sodium: 138 mmol/L (ref 135–145)

## 2018-05-13 LAB — TYPE AND SCREEN
ABO/RH(D): O POS
Antibody Screen: NEGATIVE

## 2018-05-13 LAB — I-STAT BETA HCG BLOOD, ED (MC, WL, AP ONLY): I-stat hCG, quantitative: <5 m[IU]/mL (ref <5)

## 2018-05-13 LAB — ABO/RH: ABO/RH(D): O POS

## 2018-05-13 NOTE — ED Provider Notes (Signed)
Patient placed in Quick Look pathway, seen and evaluated   Chief Complaint: Low iron, fatigue  HPI: Patient with history of iron deficiency anemia presents with worsening dyspnea on exertion, orthostatic lightheadedness, fatigue.  Has not been able to tolerate iron supplements and will vomit them up.  Denies chest pain, abdominal pain, nausea, vomiting today.  Also requesting STI testing.  States that she found out recently that her husband was unfaithful to her but she has had unprotected sex with him since the last time she was tested.  Denies any abnormal vaginal itching, bleeding, discharge, or urinary symptoms.  Notes she has heavy periods.  ROS: +lightheadedness, DOE, fatigue  Physical Exam:   Gen: No distress  Neuro: Awake and Alert  Skin: Warm    Focused Exam: Mildly tachycardic.  Exhibits pale palpebral conjunctiva.  Lungs clear to auscultation.  Abdomen soft and nontender.   Initiation of care has begun. The patient has been counseled on the process, plan, and necessity for staying for the completion/evaluation, and the remainder of the medical screening examination    Debroah Baller 05/13/18 1823    Margette Fast, MD 05/19/18 1818

## 2018-05-13 NOTE — ED Triage Notes (Signed)
Patient here from [facility] with complaints of fatigue. Reports that he has low iron and has been putting off getting iron transfusions. Unable to keep down oral iron.

## 2018-05-13 NOTE — ED Triage Notes (Addendum)
Pt reports getting blood work done about two months ago, her doctor said she had low iron. Pt attempted to do supplements but unable to keep them down, put off getting the transfusion until now because she is starting to feel increased fatigue and positional lightheadedness within the last week. Denies pain. LMP today.

## 2018-05-13 NOTE — ED Notes (Signed)
Pt up to desk.  States she does not want to wait any longer.  States her child is getting ready to leave for his father's house for the weekend and she would like to say goodbye and be a part of it.  States she will follow up at [employer].  "I know all I need is a transfusion".  This RN encouraged patient to state, but patient continues to state circumstances do not allow her to.  This RN reviewed return precautions.

## 2018-05-13 NOTE — ED Notes (Signed)
Pt also requesting STD testing as well.

## 2018-05-14 ENCOUNTER — Other Ambulatory Visit: Payer: Self-pay

## 2018-05-14 LAB — HIV ANTIBODY (ROUTINE TESTING W REFLEX): HIV Screen 4th Generation wRfx: NONREACTIVE

## 2018-05-14 LAB — RPR: RPR Ser Ql: NONREACTIVE

## 2018-05-14 NOTE — Discharge Instructions (Addendum)
Please contact the hematologist for further assessment and management of your anemia.

## 2018-05-14 NOTE — ED Notes (Signed)
Pt requesting to speak with doctor, would like to know about delay in IV

## 2018-05-14 NOTE — ED Provider Notes (Signed)
[facility] DEPT Provider Note   CSN: [id number] Arrival date & time: 05/13/18  2209     History   Chief Complaint Chief Complaint  Patient presents with  . Fatigue  . Infusion (Iron)    HPI Eileen [name] is a 27 y.o. female.  HPI 27 year old female with multiple medical problems comes in with chief complaint of fatigue and iron deficiency. Pt has not been able to tolerate oral iron and her fatigue is gotten worse.  In the past she has required IV infusion.  She has asked her PCP to send her to hematologist for IV iron, but that has not been accomplished. Patient reports that she has Hashimoto's thyroiditis and she is awaiting a consultation to endocrinology.  Patient also states that she has had moderately heavy periods, but there her usual periods.  She denies any blood loss elsewhere.  Patient also denies any fevers, chills, chest pain, shortness of breath.   Past Medical History:  Diagnosis Date  . Abortion 08/04/2011  . Anemia   . Anxiety   . Cholestasis during pregnancy   . Chronic kidney disease    kidney infection.  . Depression   . Dysuria   . Fibromyalgia   . Fibromyalgia   . Headache(784.0)   . Hypothyroid    history of.  . Mitral valve prolapse   . Neuropathy   . RA (rheumatoid arthritis) [facility])     Patient Active Problem List   Diagnosis Date Noted  . Lower urinary tract infectious disease 02/14/2018  . Encounter for sterilization   . Cholestasis during pregnancy in third trimester 10/21/2016  . Cholestasis during pregnancy, antepartum 09/30/2016  . Anemia affecting pregnancy 08/04/2016  . Back pain affecting pregnancy 08/04/2016  . Tachycardia 06/16/2016  . Supervision of normal pregnancy, antepartum 06/09/2016  . Rheumatoid arthritis ([place]) 04/07/2016  . Tobacco abuse 04/07/2016  . Depression with anxiety 11/01/2013  . Anxiety 08/01/2013  . Goiter, unspecified 06/06/2007    Past Surgical History:  Procedure  Laterality Date  . DILATION AND CURETTAGE OF UTERUS    . LAPAROSCOPY N/A 05/25/2014   Procedure: LAPAROSCOPY DIAGNOSTIC;  Surgeon: Osborne Oman, MD;  Location: [facility];  Service: Gynecology;  Laterality: N/A;  . THERAPEUTIC ABORTION N/A 2012  . TUBAL LIGATION Bilateral 10/22/2016   Procedure: POST PARTUM TUBAL LIGATION;  Surgeon: Mora Bellman, MD;  Location: [facility];  Service: Gynecology;  Laterality: Bilateral;     OB History    Gravida  6   Para  4   Term  4   Preterm  0   AB  2   Living  4     SAB      TAB  2   Ectopic  0   Multiple  0   Live Births  4            Home Medications    Prior to Admission medications   Medication Sig Start Date End Date Taking? Authorizing Provider  clonazePAM (KLONOPIN) 0.5 MG tablet Take 1 tablet (0.5 mg total) by mouth 2 (two) times daily as needed for anxiety. 03/09/18  Yes Stallings, Zoe A, MD  DULoxetine (CYMBALTA) 30 MG capsule Take 1 capsule (30 mg total) by mouth daily. 02/23/18  Yes Wieters, Hallie C, PA-C  ibuprofen (ADVIL,MOTRIN) 200 MG tablet Take 400 mg by mouth every 6 (six) hours as needed (inflammation).   Yes [provider]  lurasidone (LATUDA) 20 MG TABS tablet Take 1 tablet (20  mg total) by mouth daily. 03/09/18  Yes Forrest Moron, MD    Family History Family History  Problem Relation Age of Onset  . Cancer Mother        SKIN  . Hyperlipidemia Mother   . Thyroid disease Mother   . Heart disease Mother   . Mitral valve prolapse Mother   . Heart attack Mother   . Asthma Brother   . Bipolar disorder Brother   . Diabetes Maternal Grandmother   . Hyperlipidemia Maternal Grandmother   . Emphysema Paternal Grandfather     Social History Social History   Tobacco Use  . Smoking status: Current Every Day Smoker    Packs/day: 1.00    Years: 8.00    Pack years: 8.00    Types: Cigarettes  . Smokeless tobacco: Current User  Substance Use Topics  . Alcohol use: Yes    Alcohol/week: 0.0  standard drinks    Comment: occasional but not while pregnant  . Drug use: No     Allergies   Eggs or egg-derived products; Food; Red dye; and Sulfa antibiotics   Review of Systems Review of Systems  Constitutional: Positive for activity change and fatigue. Negative for chills, diaphoresis and fever.  Respiratory: Negative for shortness of breath.   Cardiovascular: Negative for chest pain.  Genitourinary: Negative for dysuria.  Neurological: Negative for dizziness and headaches.  Hematological: Does not bruise/bleed easily.     Physical Exam Updated Vital Signs BP (!) 90/55   Pulse 77   Temp 98.9 F (37.2 C) (Oral)   Resp 17   Ht 4\' 10"  (1.473 m)   Wt 47.6 kg   LMP 05/13/2018   SpO2 100%   BMI 21.95 kg/m   Physical Exam  Constitutional: She is oriented to person, place, and time. She appears well-developed.  HENT:  Head: Normocephalic and atraumatic.  Eyes: EOM are normal.  Neck: Normal range of motion. Neck supple.  Cardiovascular: Normal rate.  Pulmonary/Chest: Effort normal.  Abdominal: Bowel sounds are normal.  Neurological: She is alert and oriented to person, place, and time.  Skin: Skin is warm and dry.  Nursing note and vitals reviewed.    ED Treatments / Results  Labs (all labs ordered are listed, but only abnormal results are displayed) Labs Reviewed - No data to display  EKG None  Radiology No results found.  Procedures Procedures (including critical care time)  Medications Ordered in ED Medications - No data to display   Initial Impression / Assessment and Plan / ED Course  I have reviewed the triage vital signs and the nursing notes.  Pertinent labs & imaging results that were available during my care of the patient were reviewed by me and considered in my medical decision making (see chart for details).     27 year old female comes in with chief complaint of weakness.  She is requesting IV iron.  I informed her that typically we  did not infuse iron in the emergency setting.  At that time patient stated that she would be happy if we give her follow-up with hematologist for further management of her iron deficiency anemia.  Patient has had iron deficiency anemia leading to fatigue and similar symptoms in the past.  Mother at bedside is also confirming the history.  It appears that the PCP has not been able to set up referral to endocrinology and hematology.  It is possible that her symptoms could also be because of her thyroid disorder.  I  do not see any need for emergent IV iron infusion or calling a hematologist at 4 in the morning.  Patient was informed of this and she has requested a follow-up appointment with a hematologist.  Additionally, I also informed her that her fatigue could be because of thyroid disorder and for her to follow-up with her PCP in regard to that.  Final Clinical Impressions(s) / ED Diagnoses   Final diagnoses:  Chronic anemia    ED Discharge Orders    None       Varney Biles, MD 05/14/18 0451

## 2018-05-16 ENCOUNTER — Telehealth: Payer: Self-pay | Admitting: Oncology

## 2018-05-16 NOTE — Telephone Encounter (Signed)
Pt cld needing to see a hematology appt for chronic anemia. Pt was seen in the ED and was told to follow up with a hematologist. Pt has been scheduled to see Dr. Alen Blew on 10/23 at 11am. Pt aware to arrive 30 minutes early.

## 2018-05-17 DIAGNOSIS — H00025 Hordeolum internum left lower eyelid: Secondary | ICD-10-CM | POA: Diagnosis not present

## 2018-05-18 ENCOUNTER — Telehealth: Payer: Self-pay | Admitting: Family Medicine

## 2018-05-18 ENCOUNTER — Inpatient Hospital Stay: Payer: 59

## 2018-05-18 ENCOUNTER — Inpatient Hospital Stay: Payer: 59 | Attending: Oncology | Admitting: Oncology

## 2018-05-18 ENCOUNTER — Telehealth: Payer: Self-pay | Admitting: Oncology

## 2018-05-18 VITALS — BP 105/66 | HR 92 | Temp 98.4°F | Resp 18 | Ht <= 58 in | Wt 108.0 lb

## 2018-05-18 DIAGNOSIS — F419 Anxiety disorder, unspecified: Secondary | ICD-10-CM | POA: Diagnosis not present

## 2018-05-18 DIAGNOSIS — N920 Excessive and frequent menstruation with regular cycle: Secondary | ICD-10-CM | POA: Insufficient documentation

## 2018-05-18 DIAGNOSIS — D509 Iron deficiency anemia, unspecified: Secondary | ICD-10-CM | POA: Insufficient documentation

## 2018-05-18 DIAGNOSIS — G629 Polyneuropathy, unspecified: Secondary | ICD-10-CM | POA: Insufficient documentation

## 2018-05-18 DIAGNOSIS — N189 Chronic kidney disease, unspecified: Secondary | ICD-10-CM | POA: Insufficient documentation

## 2018-05-18 DIAGNOSIS — I341 Nonrheumatic mitral (valve) prolapse: Secondary | ICD-10-CM | POA: Diagnosis not present

## 2018-05-18 DIAGNOSIS — F329 Major depressive disorder, single episode, unspecified: Secondary | ICD-10-CM | POA: Diagnosis not present

## 2018-05-18 DIAGNOSIS — M797 Fibromyalgia: Secondary | ICD-10-CM | POA: Diagnosis not present

## 2018-05-18 DIAGNOSIS — Z79899 Other long term (current) drug therapy: Secondary | ICD-10-CM | POA: Insufficient documentation

## 2018-05-18 DIAGNOSIS — E039 Hypothyroidism, unspecified: Secondary | ICD-10-CM | POA: Insufficient documentation

## 2018-05-18 DIAGNOSIS — D649 Anemia, unspecified: Secondary | ICD-10-CM

## 2018-05-18 DIAGNOSIS — M069 Rheumatoid arthritis, unspecified: Secondary | ICD-10-CM | POA: Insufficient documentation

## 2018-05-18 DIAGNOSIS — F17210 Nicotine dependence, cigarettes, uncomplicated: Secondary | ICD-10-CM

## 2018-05-18 LAB — CBC WITH DIFFERENTIAL (CANCER CENTER ONLY)
Abs Immature Granulocytes: 0.01 10*3/uL (ref 0.00–0.07)
BASOS PCT: 1 %
Basophils Absolute: 0.1 10*3/uL (ref 0.0–0.1)
EOS ABS: 0.3 10*3/uL (ref 0.0–0.5)
Eosinophils Relative: 4 %
HCT: 31.5 % — ABNORMAL LOW (ref 36.0–46.0)
Hemoglobin: 9.1 g/dL — ABNORMAL LOW (ref 12.0–15.0)
Immature Granulocytes: 0 %
Lymphocytes Relative: 39 %
Lymphs Abs: 2.6 10*3/uL (ref 0.7–4.0)
MCH: 20 pg — AB (ref 26.0–34.0)
MCHC: 28.9 g/dL — ABNORMAL LOW (ref 30.0–36.0)
MCV: 69.4 fL — AB (ref 80.0–100.0)
MONO ABS: 0.6 10*3/uL (ref 0.1–1.0)
MONOS PCT: 9 %
Neutro Abs: 3.2 10*3/uL (ref 1.7–7.7)
Neutrophils Relative %: 47 %
Platelet Count: 268 10*3/uL (ref 150–400)
RBC: 4.54 MIL/uL (ref 3.87–5.11)
RDW: 18.2 % — AB (ref 11.5–15.5)
WBC Count: 6.8 10*3/uL (ref 4.0–10.5)
nRBC: 0 % (ref 0.0–0.2)

## 2018-05-18 LAB — IRON AND TIBC
IRON: 11 ug/dL — AB (ref 41–142)
SATURATION RATIOS: 2 % — AB (ref 21–57)
TIBC: 481 ug/dL — ABNORMAL HIGH (ref 236–444)
UIBC: 470 ug/dL

## 2018-05-18 LAB — FERRITIN

## 2018-05-18 NOTE — Telephone Encounter (Signed)
PER SUSAN AT [facility] REFERRAL HAS BEEN RECVD AND IS IN PROCESS 10/23    Copied from [place] [id number]. Topic: Referral - Status >> May 16, 2018  3:39 PM Alfredia Ferguson R wrote: Patient reached out to [facility], Lourdes Sledge, MD office for her referral and they stated they didn't have a referral placed for her nor have they received one.

## 2018-05-18 NOTE — Progress Notes (Signed)
Reason for the request:   Anemia  HPI: I was asked by Dr. Kathrynn Humble to evaluate Eileen [name] for anemia.  She is 27 year old woman with rheumatoid arthritis as well as iron deficiency anemia that dates back to at least 2009.  At that time her ferritin was 2.  She has received intravenous iron in the past on 2 separate occasions for the last 10 years.  He has been recently diagnosed with worsening iron deficiency with iron studies on March 09, 2018 showed iron level of 10, ferritin of 3 and saturation of 2%.  She was prescribed oral iron but has been intolerant to it.  She was seen in the emergency department on 05/14/2018 with complaints of excessive fatigue and tiredness and a repeat hemoglobin on 05/13/2018 was 8.9 with an MCV of 70.4.  Her hemoglobin was 9.5 in August 2019.  She does report heavy menstrual cycles although the duration of her menses are decreasing.  She denies any hematochezia or melena.  She denies any hemoptysis or hematemesis.  She does report some mild fatigue and tiredness and mild exertional dyspnea.  She does report ice cravings.  She does not report any headaches, blurry vision, syncope or seizures. Does not report any fevers, chills or sweats.  Does not report any cough, wheezing or hemoptysis.  Does not report any chest pain, palpitation, orthopnea or leg edema.  Does not report any nausea, vomiting or abdominal pain.  Does not report any constipation or diarrhea.  Does not report any skeletal complaints.    Does not report frequency, urgency or hematuria.  Does not report any skin rashes or lesions. Does not report any heat or cold intolerance.  Does not report any lymphadenopathy or petechiae.  Does not report any anxiety or depression.  Remaining review of systems is negative.    Past Medical History:  Diagnosis Date  . Abortion 08/04/2011  . Anemia   . Anxiety   . Cholestasis during pregnancy   . Chronic kidney disease    kidney infection.  . Depression   . Dysuria   .  Fibromyalgia   . Fibromyalgia   . Headache(784.0)   . Hypothyroid    history of.  . Mitral valve prolapse   . Neuropathy   . RA (rheumatoid arthritis) ([place])   :  Past Surgical History:  Procedure Laterality Date  . DILATION AND CURETTAGE OF UTERUS    . LAPAROSCOPY N/A 05/25/2014   Procedure: LAPAROSCOPY DIAGNOSTIC;  Surgeon: Osborne Oman, MD;  Location: [facility];  Service: Gynecology;  Laterality: N/A;  . THERAPEUTIC ABORTION N/A 2012  . TUBAL LIGATION Bilateral 10/22/2016   Procedure: POST PARTUM TUBAL LIGATION;  Surgeon: Mora Bellman, MD;  Location: [facility];  Service: Gynecology;  Laterality: Bilateral;  :   Current Outpatient Medications:  .  clonazePAM (KLONOPIN) 0.5 MG tablet, Take 1 tablet (0.5 mg total) by mouth 2 (two) times daily as needed for anxiety., Disp: 30 tablet, Rfl: 1 .  DULoxetine (CYMBALTA) 30 MG capsule, Take 1 capsule (30 mg total) by mouth daily., Disp: 30 capsule, Rfl: 0 .  ibuprofen (ADVIL,MOTRIN) 200 MG tablet, Take 400 mg by mouth every 6 (six) hours as needed (inflammation)., Disp: , Rfl:  .  lurasidone (LATUDA) 20 MG TABS tablet, Take 1 tablet (20 mg total) by mouth daily., Disp: 30 tablet, Rfl: 1:  Allergies  Allergen Reactions  . Eggs Or Egg-Derived Products Other (See Comments)    Stomach pain  . Food Hives, Nausea And  Vomiting and Other (See Comments)    Reaction to green beans   . Red Dye Nausea And Vomiting  . Sulfa Antibiotics Hives and Nausea And Vomiting  :  Family History  Problem Relation Age of Onset  . Cancer Mother        SKIN  . Hyperlipidemia Mother   . Thyroid disease Mother   . Heart disease Mother   . Mitral valve prolapse Mother   . Heart attack Mother   . Asthma Brother   . Bipolar disorder Brother   . Diabetes Maternal Grandmother   . Hyperlipidemia Maternal Grandmother   . Emphysema Paternal Grandfather   :  Social History   Socioeconomic History  . Marital status: Single    Spouse name: Not on file  .  Number of children: Not on file  . Years of education: Not on file  . Highest education level: Not on file  Occupational History  . Not on file  Social Needs  . Financial resource strain: Not on file  . Food insecurity:    Worry: Not on file    Inability: Not on file  . Transportation needs:    Medical: Not on file    Non-medical: Not on file  Tobacco Use  . Smoking status: Current Every Day Smoker    Packs/day: 1.00    Years: 8.00    Pack years: 8.00    Types: Cigarettes  . Smokeless tobacco: Current User  Substance and Sexual Activity  . Alcohol use: Yes    Alcohol/week: 0.0 standard drinks    Comment: occasional but not while pregnant  . Drug use: No  . Sexual activity: Yes    Partners: Male    Birth control/protection: Surgical    Comment: tubal  Lifestyle  . Physical activity:    Days per week: Not on file    Minutes per session: Not on file  . Stress: Not on file  Relationships  . Social connections:    Talks on phone: Not on file    Gets together: Not on file    Attends religious service: Not on file    Active member of club or organization: Not on file    Attends meetings of clubs or organizations: Not on file    Relationship status: Not on file  . Intimate partner violence:    Fear of current or ex partner: Not on file    Emotionally abused: Not on file    Physically abused: Not on file    Forced sexual activity: Not on file  Other Topics Concern  . Not on file  Social History Narrative  . Not on file  :  Pertinent items are noted in HPI.  Exam: Blood pressure 105/66, pulse 92, temperature 98.4 F (36.9 C), temperature source Oral, resp. rate 18, height 4\' 10"  (1.473 m), weight 108 lb (49 kg), last menstrual period 05/13/2018, SpO2 100 %, unknown if currently breastfeeding.   ECOG 0 General appearance: alert and cooperative appeared without distress. Head: atraumatic without any abnormalities. Eyes: conjunctivae/corneas clear. PERRL.  Sclera  anicteric. Throat: lips, mucosa, and tongue normal; without oral thrush or ulcers. Resp: clear to auscultation bilaterally without rhonchi, wheezes or dullness to percussion. Cardio: regular rate and rhythm, S1, S2 normal, no murmur, click, rub or gallop GI: soft, non-tender; bowel sounds normal; no masses,  no organomegaly Skin: Skin color, texture, turgor normal. No rashes or lesions Lymph nodes: Cervical, supraclavicular, and axillary nodes normal. Neurologic: Grossly normal without  any motor, sensory or deep tendon reflexes. Musculoskeletal: No joint deformity or effusion.  CBC    Component Value Date/Time   WBC 7.1 05/13/2018 1831   RBC 4.56 05/13/2018 1831   HGB 8.9 (L) 05/13/2018 1831   HGB 9.5 (L) 03/09/2018 1004   HCT 32.1 (L) 05/13/2018 1831   HCT 32.3 (L) 03/09/2018 1004   PLT 233 05/13/2018 1831   PLT 251 03/09/2018 1004   MCV 70.4 (L) 05/13/2018 1831   MCV 68 (L) 03/09/2018 1004   MCV 81 11/04/2014 1728   MCH 19.5 (L) 05/13/2018 1831   MCHC 27.7 (L) 05/13/2018 1831   RDW 18.1 (H) 05/13/2018 1831   RDW 19.1 (H) 03/09/2018 1004   RDW 16.5 (H) 11/04/2014 1728   LYMPHSABS 3.1 05/13/2018 1831   LYMPHSABS 1.8 03/09/2018 1004   LYMPHSABS 3.0 11/04/2014 1728   MONOABS 0.6 05/13/2018 1831   MONOABS 0.4 11/04/2014 1728   EOSABS 0.3 05/13/2018 1831   EOSABS 0.2 03/09/2018 1004   EOSABS 0.2 11/04/2014 1728   BASOSABS 0.1 05/13/2018 1831   BASOSABS 0.0 03/09/2018 1004   BASOSABS 0.1 11/04/2014 1728     Chemistry      Component Value Date/Time   NA 138 05/13/2018 1831   NA 139 03/09/2018 1004   NA 137 11/04/2014 1728   K 4.3 05/13/2018 1831   K 4.2 11/04/2014 1728   CL 107 05/13/2018 1831   CL 103 11/04/2014 1728   CO2 24 05/13/2018 1831   CO2 26 11/04/2014 1728   BUN 11 05/13/2018 1831   BUN 9 03/09/2018 1004   BUN 12 11/04/2014 1728   CREATININE 0.75 05/13/2018 1831   CREATININE 0.43 (L) 07/08/2016 1110      Component Value Date/Time   CALCIUM 9.8  05/13/2018 1831   CALCIUM 9.6 11/04/2014 1728   ALKPHOS 61 03/09/2018 1004   AST 18 03/09/2018 1004   ALT 9 03/09/2018 1004   BILITOT 0.3 03/09/2018 1004        Assessment and Plan:   27 year old woman with the following:  1.  Iron deficiency anemia dating back to 2009 and was diagnosed recently in August 2019.  She presented with a hemoglobin of 9.5 and MCV of 68 with elevated RDW.  A repeat hemoglobin in May 13, 2018 was 8.9.  Iron studies in August supported diagnosis of iron deficiency with iron level of 10 and ferritin of 3.  The differential diagnosis for the etiology of her iron deficiency was discussed today.  This would include poor oral iron absorption, chronic blood losses related to menstrual bleeding as well as multiple pregnancies.  Treatment options were reviewed and she definitely would benefit from intravenous iron given her oral iron and poor absorption and poor tolerance.  Complication associated with Feraheme infusion including arthralgias, myalgias and rarely infusion related reaction and anaphylaxis.  She will receive Feraheme on 2 separate occasions for a total of 1000 mg.  I will repeat iron studies in 3 months to ensure improvement.  2.  Menorrhagia: Recommended follow-up with OB/GYN regarding this issue to minimize her blood loss in the future.  3.  Follow-up: We will be in 3 months to repeat laboratory testing.  30  minutes was spent with the patient face-to-face today.  More than 50% of time was dedicated to patient reviewing the natural course of her disease, reviewing laboratory data, discussing differential diagnosis and treatment options.    Thank you for the referral. A copy of this consult has been  forwarded to the requesting physician.

## 2018-05-18 NOTE — Telephone Encounter (Signed)
Appts scheduled avs/calendar printed per 10/23 los °

## 2018-05-20 ENCOUNTER — Inpatient Hospital Stay: Payer: 59

## 2018-05-20 VITALS — BP 101/67 | HR 83 | Temp 98.5°F | Resp 16

## 2018-05-20 DIAGNOSIS — D509 Iron deficiency anemia, unspecified: Secondary | ICD-10-CM | POA: Diagnosis not present

## 2018-05-20 MED ORDER — SODIUM CHLORIDE 0.9 % IV SOLN
510.0000 mg | Freq: Once | INTRAVENOUS | Status: AC
  Administered 2018-05-20: 510 mg via INTRAVENOUS
  Filled 2018-05-20: qty 17

## 2018-05-20 MED ORDER — SODIUM CHLORIDE 0.9 % IV SOLN
Freq: Once | INTRAVENOUS | Status: AC
  Administered 2018-05-20: 13:00:00 via INTRAVENOUS
  Filled 2018-05-20: qty 250

## 2018-05-20 NOTE — Patient Instructions (Signed)

## 2018-05-25 ENCOUNTER — Telehealth: Payer: Self-pay

## 2018-05-25 ENCOUNTER — Inpatient Hospital Stay: Payer: 59

## 2018-05-25 VITALS — BP 96/56 | HR 87 | Temp 98.7°F | Resp 18

## 2018-05-25 DIAGNOSIS — D509 Iron deficiency anemia, unspecified: Secondary | ICD-10-CM | POA: Diagnosis not present

## 2018-05-25 MED ORDER — SODIUM CHLORIDE 0.9 % IV SOLN
510.0000 mg | Freq: Once | INTRAVENOUS | Status: AC
  Administered 2018-05-25: 510 mg via INTRAVENOUS
  Filled 2018-05-25: qty 17

## 2018-05-25 MED ORDER — SODIUM CHLORIDE 0.9 % IV SOLN
Freq: Once | INTRAVENOUS | Status: AC
  Administered 2018-05-25: 16:00:00 via INTRAVENOUS
  Filled 2018-05-25: qty 250

## 2018-05-25 NOTE — Telephone Encounter (Signed)
Per 10/30 walk in. Patient requested change of infusion (okayed by charge RN "Amy")to see patient today. Added on the infusion schedule.

## 2018-05-25 NOTE — Progress Notes (Signed)
Pt refused to stay for 30 minute post-feraheme obvservation period. VSS; see flowsheet.

## 2018-05-25 NOTE — Patient Instructions (Signed)

## 2018-05-27 ENCOUNTER — Ambulatory Visit: Payer: 59

## 2018-05-30 DIAGNOSIS — E040 Nontoxic diffuse goiter: Secondary | ICD-10-CM | POA: Diagnosis not present

## 2018-05-30 DIAGNOSIS — E063 Autoimmune thyroiditis: Secondary | ICD-10-CM | POA: Diagnosis not present

## 2018-05-30 DIAGNOSIS — E071 Dyshormogenetic goiter: Secondary | ICD-10-CM | POA: Diagnosis not present

## 2018-06-09 ENCOUNTER — Ambulatory Visit: Payer: 59 | Admitting: Family Medicine

## 2018-06-09 NOTE — Progress Notes (Deleted)
No chief complaint on file.   HPI  4 review of systems  Past Medical History:  Diagnosis Date  . Abortion 08/04/2011  . Anemia   . Anxiety   . Cholestasis during pregnancy   . Chronic kidney disease    kidney infection.  . Depression   . Dysuria   . Fibromyalgia   . Fibromyalgia   . Headache(784.0)   . Hypothyroid    history of.  . Mitral valve prolapse   . Neuropathy   . RA (rheumatoid arthritis) ([facility])     Current Outpatient Medications  Medication Sig Dispense Refill  . clonazePAM (KLONOPIN) 0.5 MG tablet Take 1 tablet (0.5 mg total) by mouth 2 (two) times daily as needed for anxiety. (Patient not taking: Reported on 05/18/2018) 30 tablet 1  . DULoxetine (CYMBALTA) 30 MG capsule Take 1 capsule (30 mg total) by mouth daily. (Patient not taking: Reported on 05/18/2018) 30 capsule 0  . ibuprofen (ADVIL,MOTRIN) 200 MG tablet Take 400 mg by mouth every 6 (six) hours as needed (inflammation).    Marland Kitchen lurasidone (LATUDA) 20 MG TABS tablet Take 1 tablet (20 mg total) by mouth daily. (Patient not taking: Reported on 05/18/2018) 30 tablet 1   No current facility-administered medications for this visit.     Allergies:  Allergies  Allergen Reactions  . Eggs Or Egg-Derived Products Other (See Comments)    Stomach pain  . Food Hives, Nausea And Vomiting and Other (See Comments)    Reaction to green beans   . Red Dye Nausea And Vomiting  . Sulfa Antibiotics Hives and Nausea And Vomiting    Past Surgical History:  Procedure Laterality Date  . DILATION AND CURETTAGE OF UTERUS    . LAPAROSCOPY N/A 05/25/2014   Procedure: LAPAROSCOPY DIAGNOSTIC;  Surgeon: Osborne Oman, MD;  Location: [facility];  Service: Gynecology;  Laterality: N/A;  . THERAPEUTIC ABORTION N/A 2012  . TUBAL LIGATION Bilateral 10/22/2016   Procedure: POST PARTUM TUBAL LIGATION;  Surgeon: Mora Bellman, MD;  Location: [facility];  Service: Gynecology;  Laterality: Bilateral;    Social History   Socioeconomic  History  . Marital status: Married    Spouse name: Not on file  . Number of children: Not on file  . Years of education: Not on file  . Highest education level: Not on file  Occupational History  . Not on file  Social Needs  . Financial resource strain: Not on file  . Food insecurity:    Worry: Not on file    Inability: Not on file  . Transportation needs:    Medical: Not on file    Non-medical: Not on file  Tobacco Use  . Smoking status: Current Every Day Smoker    Packs/day: 1.00    Years: 8.00    Pack years: 8.00    Types: Cigarettes  . Smokeless tobacco: Current User  Substance and Sexual Activity  . Alcohol use: Yes    Alcohol/week: 0.0 standard drinks    Comment: occasional but not while pregnant  . Drug use: No  . Sexual activity: Yes    Partners: Male    Birth control/protection: Surgical    Comment: tubal  Lifestyle  . Physical activity:    Days per week: Not on file    Minutes per session: Not on file  . Stress: Not on file  Relationships  . Social connections:    Talks on phone: Not on file    Gets together: Not on file  Attends religious service: Not on file    Active member of club or organization: Not on file    Attends meetings of clubs or organizations: Not on file    Relationship status: Not on file  Other Topics Concern  . Not on file  Social History Narrative  . Not on file    Family History  Problem Relation Age of Onset  . Cancer Mother        SKIN  . Hyperlipidemia Mother   . Thyroid disease Mother   . Heart disease Mother   . Mitral valve prolapse Mother   . Heart attack Mother   . Asthma Brother   . Bipolar disorder Brother   . Diabetes Maternal Grandmother   . Hyperlipidemia Maternal Grandmother   . Emphysema Paternal Grandfather      ROS Review of Systems See HPI Constitution: No fevers or chills No malaise No diaphoresis Skin: No rash or itching Eyes: no blurry vision, no double vision GU: no dysuria or  hematuria Neuro: no dizziness or headaches * all others reviewed and negative   Objective: There were no vitals filed for this visit.  Physical Exam  Assessment and Plan There are no diagnoses linked to this encounter.   Niala Stcharles P [employer]

## 2018-06-14 DIAGNOSIS — E040 Nontoxic diffuse goiter: Secondary | ICD-10-CM | POA: Diagnosis not present

## 2018-06-14 DIAGNOSIS — E039 Hypothyroidism, unspecified: Secondary | ICD-10-CM | POA: Diagnosis not present

## 2018-06-14 DIAGNOSIS — E063 Autoimmune thyroiditis: Secondary | ICD-10-CM | POA: Diagnosis not present

## 2018-07-06 ENCOUNTER — Telehealth: Payer: Self-pay | Admitting: Family Medicine

## 2018-07-06 DIAGNOSIS — L209 Atopic dermatitis, unspecified: Secondary | ICD-10-CM | POA: Diagnosis not present

## 2018-07-06 DIAGNOSIS — L700 Acne vulgaris: Secondary | ICD-10-CM | POA: Diagnosis not present

## 2018-07-06 DIAGNOSIS — D485 Neoplasm of uncertain behavior of skin: Secondary | ICD-10-CM | POA: Diagnosis not present

## 2018-07-06 NOTE — Telephone Encounter (Signed)
Dr. Hipolito Bayley sent a fax over stating that pt canceled appt with them due to her out of pocket pt responsibility per her [employer] insurance. Please advise

## 2018-07-07 DIAGNOSIS — R300 Dysuria: Secondary | ICD-10-CM | POA: Diagnosis not present

## 2018-07-07 DIAGNOSIS — R509 Fever, unspecified: Secondary | ICD-10-CM | POA: Diagnosis not present

## 2018-07-07 DIAGNOSIS — N3001 Acute cystitis with hematuria: Secondary | ICD-10-CM | POA: Diagnosis not present

## 2018-07-08 NOTE — Telephone Encounter (Signed)
fyi

## 2018-08-17 ENCOUNTER — Telehealth: Payer: Self-pay

## 2018-08-17 NOTE — Telephone Encounter (Signed)
Per 1/22 voice msg return calls. Scheduled patient per requested date for follow up.

## 2018-08-19 ENCOUNTER — Other Ambulatory Visit: Payer: 59

## 2018-08-19 ENCOUNTER — Ambulatory Visit: Payer: 59 | Admitting: Oncology

## 2018-08-23 ENCOUNTER — Encounter: Payer: Self-pay | Admitting: Emergency Medicine

## 2018-08-23 ENCOUNTER — Ambulatory Visit: Payer: 59 | Admitting: Emergency Medicine

## 2018-08-23 ENCOUNTER — Other Ambulatory Visit: Payer: Self-pay

## 2018-08-23 VITALS — BP 95/66 | HR 100 | Temp 98.9°F | Resp 18 | Ht 60.75 in | Wt 102.8 lb

## 2018-08-23 DIAGNOSIS — F418 Other specified anxiety disorders: Secondary | ICD-10-CM

## 2018-08-23 DIAGNOSIS — F3178 Bipolar disorder, in full remission, most recent episode mixed: Secondary | ICD-10-CM

## 2018-08-23 MED ORDER — DULOXETINE HCL 30 MG PO CPEP: 30.0000 mg | ORAL_CAPSULE | Freq: Every day | ORAL | 1 refills | Status: DC

## 2018-08-23 MED ORDER — CLONAZEPAM 0.5 MG PO TABS: 0.5000 mg | ORAL_TABLET | Freq: Every day | ORAL | 1 refills | Status: DC | PRN

## 2018-08-23 MED ORDER — LURASIDONE HCL 20 MG PO TABS: 20.0000 mg | ORAL_TABLET | Freq: Every day | ORAL | 1 refills | Status: DC

## 2018-08-23 NOTE — Progress Notes (Signed)
Eileen [name] 28 y.o.   Chief Complaint  Patient presents with  . Referral    eval for psychiatric   . Depression    screening done- PHQ 25  . Anxiety    screening done- GAD 21  . Medication Refill    klonopin cymbalta latuda    HISTORY OF PRESENT ILLNESS: This is a 28 y.o. female with history of bipolar disorder and depression/anxiety on multiple medications requesting refills.  First visit with me.  Seen here for the first time on 04/14/2018 for the same.  Medications were held and she was referred to psychiatry and endocrinologist but patient states she never received a call. She has a history of a thyroid disorder and also rheumatoid arthritis. Has been on Cymbalta 30 mg daily for 3 years, Latuda 20 mg daily for about 9 months and Klonopin as needed for the past 2 years. Depression screen [facility] 2/9 08/23/2018 03/09/2018  Decreased Interest 3 3  Down, Depressed, Hopeless 3 3  PHQ - 2 Score 6 6  Altered sleeping 3 2  Tired, decreased energy 3 3  Change in appetite 3 3  Feeling bad or failure about yourself  3 3  Trouble concentrating 3 2  Moving slowly or fidgety/restless 3 1  Suicidal thoughts 1 1  PHQ-9 Score 25 21  Difficult doing work/chores Extremely dIfficult Very difficult   GAD 7 : Generalized Anxiety Score 08/23/2018 03/09/2018  Nervous, Anxious, on Edge 3 3  Control/stop worrying 3 3  Worry too much - different things 3 3  Trouble relaxing 3 3  Restless 3 3  Easily annoyed or irritable 3 3  Afraid - awful might happen 3 3  Total GAD 7 Score 21 21  Anxiety Difficulty Extremely difficult Extremely difficult     HPI   Prior to Admission medications   Medication Sig Start Date End Date Taking? Authorizing Provider  clonazePAM (KLONOPIN) 0.5 MG tablet Take 1 tablet (0.5 mg total) by mouth 2 (two) times daily as needed for anxiety. Patient not taking: Reported on 05/18/2018 03/09/18   Forrest Moron, MD  DULoxetine (CYMBALTA) 30 MG capsule Take 1 capsule (30 mg  total) by mouth daily. Patient not taking: Reported on 05/18/2018 02/23/18   Wieters, Hallie C, PA-C  ibuprofen (ADVIL,MOTRIN) 200 MG tablet Take 400 mg by mouth every 6 (six) hours as needed (inflammation).    [provider]  lurasidone (LATUDA) 20 MG TABS tablet Take 1 tablet (20 mg total) by mouth daily. Patient not taking: Reported on 05/18/2018 03/09/18   Forrest Moron, MD    Allergies  Allergen Reactions  . Eggs Or Egg-Derived Products Other (See Comments)    Stomach pain  . Food Hives, Nausea And Vomiting and Other (See Comments)    Reaction to green beans   . Red Dye Nausea And Vomiting  . Sulfa Antibiotics Hives and Nausea And Vomiting    Patient Active Problem List   Diagnosis Date Noted  . Iron deficiency anemia 05/18/2018  . Lower urinary tract infectious disease 02/14/2018  . Encounter for sterilization   . Anemia affecting pregnancy 08/04/2016  . Back pain affecting pregnancy 08/04/2016  . Tachycardia 06/16/2016  . Supervision of normal pregnancy, antepartum 06/09/2016  . Rheumatoid arthritis ([place]) 04/07/2016  . Tobacco abuse 04/07/2016  . Depression with anxiety 11/01/2013  . Anxiety 08/01/2013  . Goiter, unspecified 06/06/2007    Past Medical History:  Diagnosis Date  . Abortion 08/04/2011  . Anemia   .  Anxiety   . Cholestasis during pregnancy   . Chronic kidney disease    kidney infection.  . Depression   . Dysuria   . Fibromyalgia   . Fibromyalgia   . Headache(784.0)   . Hypothyroid    history of.  . Mitral valve prolapse   . Neuropathy   . RA (rheumatoid arthritis) ([place])     Past Surgical History:  Procedure Laterality Date  . DILATION AND CURETTAGE OF UTERUS    . LAPAROSCOPY N/A 05/25/2014   Procedure: LAPAROSCOPY DIAGNOSTIC;  Surgeon: Osborne Oman, MD;  Location: [facility];  Service: Gynecology;  Laterality: N/A;  . THERAPEUTIC ABORTION N/A 2012  . TUBAL LIGATION Bilateral 10/22/2016   Procedure: POST PARTUM TUBAL LIGATION;   Surgeon: Mora Bellman, MD;  Location: [facility];  Service: Gynecology;  Laterality: Bilateral;    Social History   Socioeconomic History  . Marital status: Married    Spouse name: Not on file  . Number of children: 4  . Years of education: Not on file  . Highest education level: Not on file  Occupational History  . Not on file  Social Needs  . Financial resource strain: Not on file  . Food insecurity:    Worry: Not on file    Inability: Not on file  . Transportation needs:    Medical: Not on file    Non-medical: Not on file  Tobacco Use  . Smoking status: Current Every Day Smoker    Packs/day: 1.00    Years: 8.00    Pack years: 8.00    Types: Cigarettes  . Smokeless tobacco: Never Used  Substance and Sexual Activity  . Alcohol use: Yes    Alcohol/week: 0.0 standard drinks    Comment: occasional   . Drug use: No  . Sexual activity: Yes    Partners: Male    Birth control/protection: Surgical    Comment: tubal  Lifestyle  . Physical activity:    Days per week: Not on file    Minutes per session: Not on file  . Stress: Not on file  Relationships  . Social connections:    Talks on phone: Not on file    Gets together: Not on file    Attends religious service: Not on file    Active member of club or organization: Not on file    Attends meetings of clubs or organizations: Not on file    Relationship status: Not on file  . Intimate partner violence:    Fear of current or ex partner: Not on file    Emotionally abused: Not on file    Physically abused: Not on file    Forced sexual activity: Not on file  Other Topics Concern  . Not on file  Social History Narrative  . Not on file    Family History  Problem Relation Age of Onset  . Cancer Mother        SKIN  . Hyperlipidemia Mother   . Thyroid disease Mother   . Heart disease Mother   . Mitral valve prolapse Mother   . Heart attack Mother   . Asthma Brother   . Bipolar disorder Brother   . Diabetes Maternal  Grandmother   . Hyperlipidemia Maternal Grandmother   . Emphysema Paternal Grandfather      Review of Systems  Constitutional: Negative.  Negative for chills and fever.  HENT: Negative.  Negative for congestion, nosebleeds and sore throat.   Eyes: Negative.  Negative for  blurred vision and double vision.  Respiratory: Negative.  Negative for cough, shortness of breath and stridor.   Cardiovascular: Negative.  Negative for chest pain and palpitations.  Gastrointestinal: Negative.  Negative for abdominal pain, nausea and vomiting.  Genitourinary: Negative.  Negative for dysuria and urgency.  Skin: Negative.  Negative for rash.  Neurological: Negative.  Negative for dizziness and headaches.  Endo/Heme/Allergies: Negative.   Psychiatric/Behavioral: Positive for depression. Negative for suicidal ideas. The patient is nervous/anxious.   All other systems reviewed and are negative.   Vitals:   08/23/18 1427  BP: 95/66  Pulse: 100  Resp: 18  Temp: 98.9 F (37.2 C)  SpO2: 99%    Physical Exam Vitals signs reviewed.  Constitutional:      Appearance: Normal appearance.  HENT:     Head: Normocephalic and atraumatic.     Nose: Nose normal.     Mouth/Throat:     Mouth: Mucous membranes are moist.     Pharynx: Oropharynx is clear.  Eyes:     Extraocular Movements: Extraocular movements intact.     Conjunctiva/sclera: Conjunctivae normal.     Pupils: Pupils are equal, round, and reactive to light.  Neck:     Musculoskeletal: Normal range of motion and neck supple.  Cardiovascular:     Rate and Rhythm: Normal rate and regular rhythm.     Heart sounds: Normal heart sounds.  Pulmonary:     Effort: Pulmonary effort is normal.     Breath sounds: Normal breath sounds.  Musculoskeletal: Normal range of motion.  Skin:    General: Skin is warm and dry.  Neurological:     General: No focal deficit present.     Mental Status: She is alert and oriented to person, place, and time.    Psychiatric:        Mood and Affect: Mood normal.        Behavior: Behavior normal.      ASSESSMENT & PLAN: Eileen [name] was seen today for referral, depression, anxiety and medication refill.  Diagnoses and all orders for this visit:  Depression with anxiety -     Ambulatory referral to Psychiatry -     clonazePAM (KLONOPIN) 0.5 MG tablet; Take 1 tablet (0.5 mg total) by mouth daily as needed for anxiety. -     lurasidone (LATUDA) 20 MG TABS tablet; Take 1 tablet (20 mg total) by mouth daily. -     DULoxetine (CYMBALTA) 30 MG capsule; Take 1 capsule (30 mg total) by mouth daily.  Bipolar disorder, in full remission, most recent episode mixed [facility]) -     Ambulatory referral to Psychiatry    Patient Instructions       If you have lab work done today you will be contacted with your lab results within the next 2 weeks.  If you have not heard from [place] then please contact us. The fastest way to get your results is to register for My Chart.   IF you received an x-ray today, you will receive an invoice from [facility] Rad[facility]. Please contact [facility] Rad[facility] at [phone] with questions or concerns regarding your invoice.   IF you received labwork today, you will receive an invoice from [place]. Please contact [facility] at [phone] with questions or concerns regarding your invoice.   Our billing staff will not be able to assist you with questions regarding bills from these companies.  You will be contacted with the lab results as soon as they are available. The fastest way  to get your results is to activate your My Chart account. Instructions are located on the last page of this paperwork. If you have not heard from [place] regarding the results in 2 weeks, please contact this office.     Major Depressive Disorder, Adult Major depressive disorder (MDD) is a mental health condition. MDD often makes you feel sad, hopeless, or helpless. MDD can also cause symptoms in your body.  MDD can affect your:  Work.  School.  Relationships.  Other normal activities. MDD can range from mild to very bad. It may occur once (single episode MDD). It can also occur many times (recurrent MDD). The main symptoms of MDD often include:  Feeling sad, depressed, or irritable most of the time.  Loss of interest. MDD symptoms also include:  Sleeping too much or too little.  Eating too much or too little.  A change in your weight.  Feeling tired (fatigue) or having low energy.  Feeling worthless.  Feeling guilty.  Trouble making decisions.  Trouble thinking clearly.  Thoughts of suicide or harming others.  Feeling weak.  Feeling agitated.  Keeping yourself from being around other people (isolation). Follow these instructions at home: Activity  Do these things as told by your doctor: ? Go back to your normal activities. ? Exercise regularly. ? Spend time outdoors. Alcohol  Talk with your doctor about how alcohol can affect your antidepressant medicines.  Do not drink alcohol. Or, limit how much alcohol you drink. ? This means no more than 1 drink a day for nonpregnant women and 2 drinks a day for men. One drink equals one of these:  12 oz of beer.  5 oz of wine.  1 oz of hard liquor. General instructions  Take over-the-counter and prescription medicines only as told by your doctor.  Eat a healthy diet.  Get plenty of sleep.  Find activities that you enjoy. Make time to do them.  Think about joining a support group. Your doctor may be able to suggest a group for you.  Keep all follow-up visits as told by your doctor. This is important. Where to find more information:  [employer] on Mental Illness: ? www.nami.[place]: ? https://carter.com/  National Suicide Prevention Lifeline: ? 705-876-7617. This is free, 24-hour help. Contact a doctor if:  Your symptoms get worse.  You have new  symptoms. Get help right away if:  You self-harm.  You see, hear, taste, smell, or feel things that are not present (hallucinate). If you ever feel like you may hurt yourself or others, or have thoughts about taking your own life, get help right away. You can go to your nearest emergency department or call:  Your local emergency services (911 in the [place]  A suicide crisis helpline, such as the National Suicide Prevention Lifeline: ? 608-682-6325. This is open 24 hours a day. This information is not intended to replace advice given to you by your health care provider. Make sure you discuss any questions you have with your health care provider. Document Released: 06/24/2015 Document Revised: 03/29/2016 Document Reviewed: 03/29/2016 Elsevier Interactive Patient Education  2019 Elsevier Inc.      Agustina Caroli, MD [facility]

## 2018-08-23 NOTE — Patient Instructions (Addendum)
Neuro Psychiatry on [address], the contact number is: [phone]    If you have lab work done today you will be contacted with your lab results within the next 2 weeks.  If you have not heard from [place] then please contact us. The fastest way to get your results is to register for My Chart.   IF you received an x-ray today, you will receive an invoice from [facility] Radiology. Please contact [facility] Radiology at [phone] with questions or concerns regarding your invoice.   IF you received labwork today, you will receive an invoice from [place]. Please contact [facility] at [phone] with questions or concerns regarding your invoice.   Our billing staff will not be able to assist you with questions regarding bills from these companies.  You will be contacted with the lab results as soon as they are available. The fastest way to get your results is to activate your My Chart account. Instructions are located on the last page of this paperwork. If you have not heard from [place] regarding the results in 2 weeks, please contact this office.     Major Depressive Disorder, Adult Major depressive disorder (MDD) is a mental health condition. MDD often makes you feel sad, hopeless, or helpless. MDD can also cause symptoms in your body. MDD can affect your:  Work.  School.  Relationships.  Other normal activities. MDD can range from mild to very bad. It may occur once (single episode MDD). It can also occur many times (recurrent MDD). The main symptoms of MDD often include:  Feeling sad, depressed, or irritable most of the time.  Loss of interest. MDD symptoms also include:  Sleeping too much or too little.  Eating too much or too little.  A change in your weight.  Feeling tired (fatigue) or having low energy.  Feeling worthless.  Feeling guilty.  Trouble making decisions.  Trouble thinking clearly.  Thoughts of suicide or harming others.  Feeling weak.  Feeling  agitated.  Keeping yourself from being around other people (isolation). Follow these instructions at home: Activity  Do these things as told by your doctor: ? Go back to your normal activities. ? Exercise regularly. ? Spend time outdoors. Alcohol  Talk with your doctor about how alcohol can affect your antidepressant medicines.  Do not drink alcohol. Or, limit how much alcohol you drink. ? This means no more than 1 drink a day for nonpregnant women and 2 drinks a day for men. One drink equals one of these:  12 oz of beer.  5 oz of wine.  1 oz of hard liquor. General instructions  Take over-the-counter and prescription medicines only as told by your doctor.  Eat a healthy diet.  Get plenty of sleep.  Find activities that you enjoy. Make time to do them.  Think about joining a support group. Your doctor may be able to suggest a group for you.  Keep all follow-up visits as told by your doctor. This is important. Where to find more information:  [employer] on Mental Illness: ? www.nami.[place]: ? https://carter.com/  National Suicide Prevention Lifeline: ? 8285663154. This is free, 24-hour help. Contact a doctor if:  Your symptoms get worse.  You have new symptoms. Get help right away if:  You self-harm.  You see, hear, taste, smell, or feel things that are not present (hallucinate). If you ever feel like you may hurt yourself or others, or have thoughts about taking your  own life, get help right away. You can go to your nearest emergency department or call:  Your local emergency services (911 in the [place]  A suicide crisis helpline, such as the National Suicide Prevention Lifeline: ? 217-837-3093. This is open 24 hours a day. This information is not intended to replace advice given to you by your health care provider. Make sure you discuss any questions you have with your health care provider. Document  Released: 06/24/2015 Document Revised: 03/29/2016 Document Reviewed: 03/29/2016 Elsevier Interactive Patient Education  2019 [employer].

## 2018-08-25 ENCOUNTER — Telehealth: Payer: Self-pay | Admitting: Family Medicine

## 2018-08-25 NOTE — Telephone Encounter (Signed)
Copied from [place] [phone]. Topic: Quick Communication - Rx Refill/Question >> Aug 25, 2018  1:51 PM Alanda Slim E wrote: Medication: lurasidone (LATUDA) 20 MG TABS tablet  Pt went to pick up scripts and this medication was $80 with the pt insurance. Pt would like to know if a more cost effective alternate can be prescribed / please advise   Has the patient contacted their pharmacy? Yes  ( Preferred Pharmacy (with phone number or street name):[id number] [address], [place]. [phone] (Phone) [phone] (Fax)

## 2018-08-26 NOTE — Telephone Encounter (Signed)
Please advise 

## 2018-08-29 ENCOUNTER — Ambulatory Visit (HOSPITAL_COMMUNITY)
Admission: EM | Admit: 2018-08-29 | Discharge: 2018-08-29 | Disposition: A | Discharge status: Home / Self Care | Payer: 59 | Attending: Family Medicine | Admitting: Family Medicine

## 2018-08-29 ENCOUNTER — Encounter (HOSPITAL_COMMUNITY): Payer: Self-pay | Admitting: Emergency Medicine

## 2018-08-29 ENCOUNTER — Other Ambulatory Visit: Payer: Self-pay

## 2018-08-29 ENCOUNTER — Encounter: Payer: Self-pay | Admitting: Emergency Medicine

## 2018-08-29 ENCOUNTER — Ambulatory Visit
Admission: EM | Admit: 2018-08-29 | Discharge: 2018-08-29 | Disposition: A | Discharge status: Home / Self Care | Payer: 59 | Attending: Family Medicine | Admitting: Family Medicine

## 2018-08-29 DIAGNOSIS — H6981 Other specified disorders of Eustachian tube, right ear: Secondary | ICD-10-CM | POA: Diagnosis not present

## 2018-08-29 DIAGNOSIS — J039 Acute tonsillitis, unspecified: Secondary | ICD-10-CM | POA: Insufficient documentation

## 2018-08-29 DIAGNOSIS — Z114 Encounter for screening for human immunodeficiency virus [HIV]: Secondary | ICD-10-CM | POA: Diagnosis present

## 2018-08-29 DIAGNOSIS — H6991 Unspecified Eustachian tube disorder, right ear: Secondary | ICD-10-CM | POA: Diagnosis present

## 2018-08-29 LAB — POCT MONO SCREEN (KUC): Mono, POC: NEGATIVE

## 2018-08-29 LAB — POCT RAPID STREP A (OFFICE): Rapid Strep A Screen: NEGATIVE

## 2018-08-29 MED ORDER — AMOXICILLIN 500 MG PO CAPS: 500.0000 mg | ORAL_CAPSULE | Freq: Three times a day (TID) | ORAL | 0 refills | Status: DC

## 2018-08-29 MED ORDER — AMOXICILLIN-POT CLAVULANATE 875-125 MG PO TABS: 1.0000 | ORAL_TABLET | Freq: Two times a day (BID) | ORAL | 0 refills | Status: AC

## 2018-08-29 MED ORDER — IBUPROFEN 600 MG PO TABS: 600.0000 mg | ORAL_TABLET | Freq: Four times a day (QID) | ORAL | 0 refills | Status: DC | PRN

## 2018-08-29 NOTE — Discharge Instructions (Signed)
Sore Throat  Your rapid strep tested Negative today. We will send for a culture and call in about 2 days if results are positive. You do appear to have tonsillits, begin Augmentin  Use anti-inflammatories for pain/swelling. You may take up to 800 mg Ibuprofen every 8 hours with food. You may supplement Ibuprofen with Tylenol [phone] mg every 8 hours.  Warm Compress to neck  Please continue Tylenol or Ibuprofen for fever and pain. May try salt water gargles, cepacol lozenges, throat spray, or OTC cold relief medicine for throat discomfort. If you also have congestion take a daily anti-histamine like Zyrtec, Claritin, and a oral decongestant to help with post nasal drip that may be irritating your throat.   Stay hydrated and drink plenty of fluids to keep your throat coated relieve irritation.

## 2018-08-29 NOTE — ED Provider Notes (Signed)
[place]    CSN: [id number] Arrival date & time: 08/29/18  0901     History   Chief Complaint Chief Complaint  Patient presents with  . Sore Throat    HPI Eileen [name] is a 28 y.o. female.   Patient complains of 1 sided sore throat with earache on that side.  She states she has seen some white pus draining from the tonsil on the right side.  No history of fever.  HPI  Past Medical History:  Diagnosis Date  . Abortion 08/04/2011  . Anemia   . Anxiety   . Cholestasis during pregnancy   . Chronic kidney disease    kidney infection.  . Depression   . Dysuria   . Fibromyalgia   . Fibromyalgia   . Headache(784.0)   . Hypothyroid    history of.  . Mitral valve prolapse   . Neuropathy   . RA (rheumatoid arthritis) [facility])     Patient Active Problem List   Diagnosis Date Noted  . Dysfunction of right eustachian tube 08/29/2018  . Iron deficiency anemia 05/18/2018  . Lower urinary tract infectious disease 02/14/2018  . Encounter for sterilization   . Anemia affecting pregnancy 08/04/2016  . Back pain affecting pregnancy 08/04/2016  . Tachycardia 06/16/2016  . Supervision of normal pregnancy, antepartum 06/09/2016  . Rheumatoid arthritis ([place]) 04/07/2016  . Tobacco abuse 04/07/2016  . Depression with anxiety 11/01/2013  . Anxiety 08/01/2013  . Goiter, unspecified 06/06/2007    Past Surgical History:  Procedure Laterality Date  . DILATION AND CURETTAGE OF UTERUS    . LAPAROSCOPY N/A 05/25/2014   Procedure: LAPAROSCOPY DIAGNOSTIC;  Surgeon: Osborne Oman, MD;  Location: [facility];  Service: Gynecology;  Laterality: N/A;  . THERAPEUTIC ABORTION N/A 2012  . TUBAL LIGATION Bilateral 10/22/2016   Procedure: POST PARTUM TUBAL LIGATION;  Surgeon: Mora Bellman, MD;  Location: [facility];  Service: Gynecology;  Laterality: Bilateral;    OB History    Gravida  6   Para  4   Term  4   Preterm  0   AB  2   Living  4     SAB      TAB  2   Ectopic    0   Multiple  0   Live Births  4            Home Medications    Prior to Admission medications   Medication Sig Start Date End Date Taking? Authorizing Provider  clonazePAM (KLONOPIN) 0.5 MG tablet Take 1 tablet (0.5 mg total) by mouth daily as needed for anxiety. 08/23/18  Yes Sagardia, Ines Bloomer, MD  amoxicillin (AMOXIL) 500 MG capsule Take 1 capsule (500 mg total) by mouth 3 (three) times daily. 08/29/18   Wardell Honour, MD  DULoxetine (CYMBALTA) 30 MG capsule Take 1 capsule (30 mg total) by mouth daily. 08/23/18 11/21/18  Horald Pollen, MD  ibuprofen (ADVIL,MOTRIN) 200 MG tablet Take 400 mg by mouth every 6 (six) hours as needed (inflammation).    [provider]  lurasidone (LATUDA) 20 MG TABS tablet Take 1 tablet (20 mg total) by mouth daily. 08/23/18 11/21/18  Horald Pollen, MD    Family History Family History  Problem Relation Age of Onset  . Cancer Mother        SKIN  . Hyperlipidemia Mother   . Thyroid disease Mother   . Heart disease Mother   . Mitral valve prolapse Mother   .  Heart attack Mother   . Asthma Brother   . Bipolar disorder Brother   . Diabetes Maternal Grandmother   . Hyperlipidemia Maternal Grandmother   . Emphysema Paternal Grandfather     Social History Social History   Tobacco Use  . Smoking status: Current Every Day Smoker    Packs/day: 1.00    Years: 8.00    Pack years: 8.00    Types: Cigarettes  . Smokeless tobacco: Never Used  Substance Use Topics  . Alcohol use: Yes    Alcohol/week: 0.0 standard drinks    Comment: occasional   . Drug use: No     Allergies   Eggs or egg-derived products; Food; Red dye; and Sulfa antibiotics   Review of Systems Review of Systems  HENT: Positive for ear pain and sore throat.   All other systems reviewed and are negative.    Physical Exam Triage Vital Signs ED Triage Vitals  Enc Vitals Group     BP 08/29/18 0947 99/61     Pulse Rate 08/29/18 0947 72      Resp 08/29/18 0947 18     Temp 08/29/18 0947 98.1 F (36.7 C)     Temp Source 08/29/18 0947 Oral     SpO2 08/29/18 0947 100 %     Weight --      Height --      Head Circumference --      Peak Flow --      Pain Score 08/29/18 0954 6     Pain Loc --      Pain Edu? --      Excl. in [place]? --    No data found.  Updated Vital Signs BP 99/61 (BP Location: Left Arm)   Pulse 72   Temp 98.1 F (36.7 C) (Oral)   Resp 18   LMP 08/22/2018   SpO2 100%   Visual Acuity Right Eye Distance:   Left Eye Distance:   Bilateral Distance:    Right Eye Near:   Left Eye Near:    Bilateral Near:     Physical Exam Constitutional:      Appearance: She is well-developed and normal weight.  HENT:     Ears:     Comments: Right TM is dull without light reflex    Mouth/Throat:     Comments: Throat is injected there is some exudate right tonsil there are tender submandibular nodes on the right greater than left Neurological:     Mental Status: She is alert.      UC Treatments / Results  Labs (all labs ordered are listed, but only abnormal results are displayed) Labs Reviewed - No data to display  EKG None  Radiology No results found.  Procedures Procedures (including critical care time)  Medications Ordered in UC Medications - No data to display  Initial Impression / Assessment and Plan / UC Course  I have reviewed the triage vital signs and the nursing notes.  Pertinent labs & imaging results that were available during my care of the patient were reviewed by me and considered in my medical decision making (see chart for details).     Eustachian tube dysfunction on the right side from will give her Flomax Final Clinical Impressions(s) / UC Diagnoses   Final diagnoses:  Dysfunction of right eustachian tube     Discharge Instructions     Pick up Flonase at the drugstore and use once a day   ED Prescriptions    Medication Sig  Dispense Auth. Provider   amoxicillin  (AMOXIL) 500 MG capsule Take 1 capsule (500 mg total) by mouth 3 (three) times daily. 21 capsule Wardell Honour, MD     Controlled Substance Prescriptions  Controlled Substance Registry consulted? No   Wardell Honour, MD 08/29/18 1019

## 2018-08-29 NOTE — ED Triage Notes (Signed)
Pt presents to [facility] for assessment of pus oozing from right tonsil and pain for 2-3 days.

## 2018-08-29 NOTE — Discharge Instructions (Addendum)
Pick up Flonase at the drugstore and use once a day

## 2018-08-29 NOTE — ED Notes (Signed)
Patient able to ambulate independently  

## 2018-08-29 NOTE — ED Provider Notes (Signed)
[facility]    CSN: [id number] Arrival date & time: 08/29/18  1750     History   Chief Complaint Chief Complaint  Patient presents with  . Oral Swelling    HPI Eileen [name] is a 28 y.o. female history of tobacco use presenting today for evaluation of sore throat.  Patient states that over the past 3 days she has had swelling and pain on the right side of her throat.  She is also noticed pus and white spots on her tonsil.  She has had ear pain related to this as well.  Denies congestion and cough.  Denies fevers.  She has not taken anything for symptoms.  She is also concerned about being exposed to HIV.  States that she recently got back with her husband who was with 3 other partners while they were apart.  She has not had any symptoms.  She mainly would like to be checked for HIV.  Denies any vaginal discharge, rashes.  HPI  Past Medical History:  Diagnosis Date  . Abortion 08/04/2011  . Anemia   . Anxiety   . Cholestasis during pregnancy   . Chronic kidney disease    kidney infection.  . Depression   . Dysuria   . Fibromyalgia   . Fibromyalgia   . Headache(784.0)   . Hypothyroid    history of.  . Mitral valve prolapse   . Neuropathy   . RA (rheumatoid arthritis) [facility])     Patient Active Problem List   Diagnosis Date Noted  . Dysfunction of right eustachian tube 08/29/2018  . Iron deficiency anemia 05/18/2018  . Lower urinary tract infectious disease 02/14/2018  . Encounter for sterilization   . Anemia affecting pregnancy 08/04/2016  . Back pain affecting pregnancy 08/04/2016  . Tachycardia 06/16/2016  . Supervision of normal pregnancy, antepartum 06/09/2016  . Rheumatoid arthritis ([place]) 04/07/2016  . Tobacco abuse 04/07/2016  . Depression with anxiety 11/01/2013  . Anxiety 08/01/2013  . Goiter, unspecified 06/06/2007    Past Surgical History:  Procedure Laterality Date  . DILATION AND CURETTAGE OF UTERUS    . LAPAROSCOPY N/A 05/25/2014   Procedure: LAPAROSCOPY DIAGNOSTIC;  Surgeon: Osborne Oman, MD;  Location: [facility];  Service: Gynecology;  Laterality: N/A;  . THERAPEUTIC ABORTION N/A 2012  . TUBAL LIGATION Bilateral 10/22/2016   Procedure: POST PARTUM TUBAL LIGATION;  Surgeon: Mora Bellman, MD;  Location: [facility];  Service: Gynecology;  Laterality: Bilateral;    OB History    Gravida  6   Para  4   Term  4   Preterm  0   AB  2   Living  4     SAB      TAB  2   Ectopic  0   Multiple  0   Live Births  4            Home Medications    Prior to Admission medications   Medication Sig Start Date End Date Taking? Authorizing Provider  clonazePAM (KLONOPIN) 0.5 MG tablet Take 1 tablet (0.5 mg total) by mouth daily as needed for anxiety. 08/23/18  Yes SagardiaInes Bloomer, MD  amoxicillin-clavulanate (AUGMENTIN) 875-125 MG tablet Take 1 tablet by mouth 2 (two) times daily for 10 days. 08/29/18 09/08/18  Wieters, Hallie C, PA-C  DULoxetine (CYMBALTA) 30 MG capsule Take 1 capsule (30 mg total) by mouth daily. 08/23/18 11/21/18  Horald Pollen, MD  ibuprofen (ADVIL,MOTRIN) 600 MG tablet Take 1 tablet (  600 mg total) by mouth every 6 (six) hours as needed. 08/29/18   Wieters, Hallie C, PA-C  lurasidone (LATUDA) 20 MG TABS tablet Take 1 tablet (20 mg total) by mouth daily. 08/23/18 11/21/18  Horald Pollen, MD    Family History Family History  Problem Relation Age of Onset  . Cancer Mother        SKIN  . Hyperlipidemia Mother   . Thyroid disease Mother   . Heart disease Mother   . Mitral valve prolapse Mother   . Heart attack Mother   . Asthma Brother   . Bipolar disorder Brother   . Diabetes Maternal Grandmother   . Hyperlipidemia Maternal Grandmother   . Emphysema Paternal Grandfather     Social History Social History   Tobacco Use  . Smoking status: Current Every Day Smoker    Packs/day: 1.00    Years: 8.00    Pack years: 8.00    Types: Cigarettes  . Smokeless tobacco: Never Used   Substance Use Topics  . Alcohol use: Yes    Alcohol/week: 0.0 standard drinks    Comment: occasional   . Drug use: No     Allergies   Eggs or egg-derived products; Food; Red dye; and Sulfa antibiotics   Review of Systems Review of Systems  Constitutional: Negative for activity change, appetite change, chills, fatigue and fever.  HENT: Positive for ear pain and sore throat. Negative for congestion, rhinorrhea, sinus pressure and trouble swallowing.   Eyes: Negative for discharge and redness.  Respiratory: Negative for cough, chest tightness and shortness of breath.   Cardiovascular: Negative for chest pain.  Gastrointestinal: Positive for nausea. Negative for abdominal pain, diarrhea and vomiting.  Musculoskeletal: Negative for myalgias.  Skin: Negative for rash.  Neurological: Negative for dizziness, light-headedness and headaches.     Physical Exam Triage Vital Signs ED Triage Vitals  Enc Vitals Group     BP 08/29/18 1756 114/80     Pulse Rate 08/29/18 1756 94     Resp 08/29/18 1756 18     Temp 08/29/18 1756 97.7 F (36.5 C)     Temp Source 08/29/18 1756 Oral     SpO2 08/29/18 1756 98 %     Weight --      Height --      Head Circumference --      Peak Flow --      Pain Score 08/29/18 1757 7     Pain Loc --      Pain Edu? --      Excl. in [place]? --    No data found.  Updated Vital Signs BP 114/80 (BP Location: Left Arm)   Pulse 94   Temp 97.7 F (36.5 C) (Oral)   Resp 18   LMP 08/22/2018   SpO2 98%   Visual Acuity Right Eye Distance:   Left Eye Distance:   Bilateral Distance:    Right Eye Near:   Left Eye Near:    Bilateral Near:     Physical Exam Vitals signs and nursing note reviewed.  Constitutional:      General: She is not in acute distress.    Appearance: She is well-developed.  HENT:     Head: Normocephalic and atraumatic.     Ears:     Comments: Bilateral ears without tenderness to palpation of external auricle, tragus and mastoid,  EAC's without erythema or swelling, TM's with good bony landmarks and cone of light. Non erythematous.  Nose:     Comments: Nasal mucosa pink, nonswollen turbinates    Mouth/Throat:     Comments: Oral mucosa pink and moist, right tonsil with moderate enlargement, 1 small patch of exudate, moderate erythema, left tonsil mildly enlarged without erythema, no exudate. Posterior pharynx patent and nonerythematous, no uvula deviation or swelling. Normal phonation. Eyes:     Conjunctiva/sclera: Conjunctivae normal.  Neck:     Musculoskeletal: Neck supple.     Comments: Tender lymphadenopathy palpable on right superior aspect of cervical chain Cardiovascular:     Rate and Rhythm: Normal rate and regular rhythm.     Heart sounds: No murmur.  Pulmonary:     Effort: Pulmonary effort is normal. No respiratory distress.     Breath sounds: Normal breath sounds.     Comments: Breathing comfortably at rest, CTABL, no wheezing, rales or other adventitious sounds auscultated Abdominal:     Palpations: Abdomen is soft.     Tenderness: There is no abdominal tenderness.  Skin:    General: Skin is warm and dry.  Neurological:     Mental Status: She is alert.      UC Treatments / Results  Labs (all labs ordered are listed, but only abnormal results are displayed) Labs Reviewed  CULTURE, GROUP A STREP [facility])  HIV ANTIBODY (ROUTINE TESTING W REFLEX)  POCT RAPID STREP A (OFFICE)  POCT MONO SCREEN ([facility])    EKG None  Radiology No results found.  Procedures Procedures (including critical care time)  Medications Ordered in UC Medications - No data to display  Initial Impression / Assessment and Plan / UC Course  I have reviewed the triage vital signs and the nursing notes.  Pertinent labs & imaging results that were available during my care of the patient were reviewed by me and considered in my medical decision making (see chart for details).     Patient appears to have tonsillitis,  will check for strep as well as mono.  Both negative, will treat for tonsillitis with Augmentin.  Tylenol and ibuprofen with warm compresses to help with lymphadenopathy.  HIV drawn given patient's concern.  Will call patient with results.  Discussed strict return precautions. Patient verbalized understanding and is agreeable with plan.  Final Clinical Impressions(s) / UC Diagnoses   Final diagnoses:  Acute tonsillitis, unspecified etiology     Discharge Instructions     Sore Throat  Your rapid strep tested Negative today. We will send for a culture and call in about 2 days if results are positive. You do appear to have tonsillits, begin Augmentin  Use anti-inflammatories for pain/swelling. You may take up to 800 mg Ibuprofen every 8 hours with food. You may supplement Ibuprofen with Tylenol [phone] mg every 8 hours.  Warm Compress to neck  Please continue Tylenol or Ibuprofen for fever and pain. May try salt water gargles, cepacol lozenges, throat spray, or OTC cold relief medicine for throat discomfort. If you also have congestion take a daily anti-histamine like Zyrtec, Claritin, and a oral decongestant to help with post nasal drip that may be irritating your throat.   Stay hydrated and drink plenty of fluids to keep your throat coated relieve irritation.     ED Prescriptions    Medication Sig Dispense Auth. Provider   amoxicillin-clavulanate (AUGMENTIN) 875-125 MG tablet Take 1 tablet by mouth 2 (two) times daily for 10 days. 20 tablet Wieters, Hallie C, PA-C   ibuprofen (ADVIL,MOTRIN) 600 MG tablet Take 1 tablet (600 mg total) by  mouth every 6 (six) hours as needed. 30 tablet Wieters, Woodward C, PA-C     Controlled Substance Prescriptions [place] Controlled Substance Registry consulted? Not Applicable   Janith Lima, [place] 08/29/18 0737

## 2018-08-29 NOTE — ED Triage Notes (Signed)
Sore throat for 2-3 days ago.  Patient reports pus oozing from right tonsil.  Noticed last night.  Patient has right ear pain as well

## 2018-08-31 LAB — HIV ANTIBODY (ROUTINE TESTING W REFLEX): HIV SCREEN 4TH GENERATION: NONREACTIVE

## 2018-09-01 ENCOUNTER — Encounter: Payer: Self-pay | Admitting: Family Medicine

## 2018-09-01 DIAGNOSIS — E063 Autoimmune thyroiditis: Secondary | ICD-10-CM

## 2018-09-01 HISTORY — DX: Autoimmune thyroiditis: E06.3

## 2018-09-02 LAB — CULTURE, GROUP A STREP (THRC)

## 2018-09-10 NOTE — Telephone Encounter (Signed)
Please notify the patient that the $80 out of pocket is one of the lowest I have heard as the medication is about $1400. There is no alternative that I am aware of. It is not yet a generic medication.  I see also that she went to the [facility] and was treated with antibiotic. She can make an appointment for follow up if her symptoms do not improve.

## 2018-09-12 NOTE — Telephone Encounter (Signed)
Left message to return call.  Dgaddy, CMA

## 2018-09-13 NOTE — Telephone Encounter (Signed)
I talked with pt and stated that this medication is usually about $1400.   Pt states that she was on medicaid before and now she has [facility] and can not afford $80 for the medication. Pt sates that she will being seeing the psychiatrist on Friday and will ask if she can  prescribe something else.

## 2018-09-27 ENCOUNTER — Inpatient Hospital Stay (HOSPITAL_BASED_OUTPATIENT_CLINIC_OR_DEPARTMENT_OTHER): Payer: 59 | Admitting: Oncology

## 2018-09-27 ENCOUNTER — Telehealth: Payer: Self-pay | Admitting: Oncology

## 2018-09-27 ENCOUNTER — Inpatient Hospital Stay: Payer: 59 | Attending: Family Medicine

## 2018-09-27 ENCOUNTER — Telehealth: Payer: Self-pay

## 2018-09-27 VITALS — BP 100/67 | HR 85 | Temp 98.3°F | Resp 18 | Ht 60.75 in | Wt 106.0 lb

## 2018-09-27 DIAGNOSIS — D500 Iron deficiency anemia secondary to blood loss (chronic): Secondary | ICD-10-CM | POA: Diagnosis present

## 2018-09-27 DIAGNOSIS — N920 Excessive and frequent menstruation with regular cycle: Secondary | ICD-10-CM | POA: Diagnosis not present

## 2018-09-27 DIAGNOSIS — Z79899 Other long term (current) drug therapy: Secondary | ICD-10-CM

## 2018-09-27 DIAGNOSIS — D649 Anemia, unspecified: Secondary | ICD-10-CM

## 2018-09-27 DIAGNOSIS — D509 Iron deficiency anemia, unspecified: Secondary | ICD-10-CM

## 2018-09-27 LAB — CBC WITH DIFFERENTIAL (CANCER CENTER ONLY)
Abs Immature Granulocytes: 0.01 10*3/uL (ref 0.00–0.07)
Basophils Absolute: 0.1 10*3/uL (ref 0.0–0.1)
Basophils Relative: 1 %
Eosinophils Absolute: 0.2 10*3/uL (ref 0.0–0.5)
Eosinophils Relative: 3 %
HCT: 40.5 % (ref 36.0–46.0)
Hemoglobin: 13.4 g/dL (ref 12.0–15.0)
IMMATURE GRANULOCYTES: 0 %
Lymphocytes Relative: 35 %
Lymphs Abs: 2.4 10*3/uL (ref 0.7–4.0)
MCH: 30.3 pg (ref 26.0–34.0)
MCHC: 33.1 g/dL (ref 30.0–36.0)
MCV: 91.6 fL (ref 80.0–100.0)
Monocytes Absolute: 0.6 10*3/uL (ref 0.1–1.0)
Monocytes Relative: 8 %
NEUTROS PCT: 53 %
Neutro Abs: 3.5 10*3/uL (ref 1.7–7.7)
Platelet Count: 178 10*3/uL (ref 150–400)
RBC: 4.42 MIL/uL (ref 3.87–5.11)
RDW: 12.4 % (ref 11.5–15.5)
WBC Count: 6.8 10*3/uL (ref 4.0–10.5)
nRBC: 0 % (ref 0.0–0.2)

## 2018-09-27 LAB — IRON AND TIBC
IRON: 73 ug/dL (ref 41–142)
Saturation Ratios: 23 % (ref 21–57)
TIBC: 315 ug/dL (ref 236–444)
UIBC: 242 ug/dL (ref 120–384)

## 2018-09-27 LAB — FERRITIN: Ferritin: 34 ng/mL (ref 11–307)

## 2018-09-27 NOTE — Telephone Encounter (Signed)
-----   Message from Wyatt Portela, MD sent at 09/27/2018  2:40 PM EST ----- Please let her know her iron leve is normal. No Iron infusion for now.

## 2018-09-27 NOTE — Telephone Encounter (Signed)
Gave patient avs report and appointments for June.  °

## 2018-09-27 NOTE — Progress Notes (Signed)
Hematology and Oncology Follow Up Visit  Eileen [name] [id number] 09-08-1990 28 y.o. 09/27/2018 1:18 PM Eileen [name], MDStallings, Eileen Solomons, MD   Principle Diagnosis: 28 year old woman with iron deficiency anemia related to chronic blood losses from menorrhagia diagnosed in October 2019.  She presented with a hemoglobin of 9 and ferritin less than 4.   Prior Therapy: She status post Feraheme infusion in October 2019 and received a total of 1 g.  Current therapy: Active surveillance she is intolerant to oral iron therapy.  Interim History: Eileen [name] presents today for a follow-up visit.  Since her last visit she received intravenous iron infusion which she tolerated very well with improvement in her overall performance status and activity level.  She denies any excessive fatigue, tiredness or ice cravings.  She reported improvement in her overall quality of life although recently she started developing more fatigue.  She continues to have issues with menorrhagia with menstrual cycles are very heavy and lasting 5 to 7 days.  She denies any GI bleeding she denies any hemoptysis or hematemesis.  She does not report any headaches, blurry vision, syncope or seizures. Does not report any fevers, chills or sweats.  Does not report any cough, wheezing or hemoptysis.  Does not report any chest pain, palpitation, orthopnea or leg edema.  Does not report any nausea, vomiting or abdominal pain.  Does not report any constipation or diarrhea.  Does not report any skeletal complaints.    Does not report frequency, urgency or hematuria.  Does not report any skin rashes or lesions. Does not report any heat or cold intolerance.  Does not report any lymphadenopathy or petechiae.  Does not report any anxiety or depression.  Remaining review of systems is negative.    Medications: I have reviewed the patient's current medications.  Current Outpatient Medications  Medication Sig Dispense Refill  . clonazePAM (KLONOPIN)  0.5 MG tablet Take 1 tablet (0.5 mg total) by mouth daily as needed for anxiety. 30 tablet 1  . DULoxetine (CYMBALTA) 30 MG capsule Take 1 capsule (30 mg total) by mouth daily. 90 capsule 1  . ibuprofen (ADVIL,MOTRIN) 600 MG tablet Take 1 tablet (600 mg total) by mouth every 6 (six) hours as needed. 30 tablet 0  . lurasidone (LATUDA) 20 MG TABS tablet Take 1 tablet (20 mg total) by mouth daily. 90 tablet 1   No current facility-administered medications for this visit.      Allergies:  Allergies  Allergen Reactions  . Eggs Or Egg-Derived Products Other (See Comments)    Stomach pain  . Food Hives, Nausea And Vomiting and Other (See Comments)    Reaction to green beans   . Red Dye Nausea And Vomiting  . Sulfa Antibiotics Hives and Nausea And Vomiting    Past Medical History, Surgical history, Social history, and Family History were reviewed and updated.    Physical Exam: Blood pressure 100/67, pulse 85, temperature 98.3 F (36.8 C), temperature source Oral, resp. rate 18, height 5' 0.75" (1.543 m), weight 106 lb (48.1 kg), SpO2 100 %, not currently breastfeeding. ECOG: 0 General appearance: alert and cooperative appeared without distress. Head: Normocephalic, without obvious abnormality Oropharynx: No oral thrush or ulcers. Eyes: No scleral icterus.  Pupils are equal and round reactive to light. Lymph nodes: Cervical, supraclavicular, and axillary nodes normal. Heart:regular rate and rhythm, S1, S2 normal, no murmur, click, rub or gallop Lung:chest clear, no wheezing, rales, normal symmetric air entry Abdomin: soft, non-tender, without masses or organomegaly.  Neurological: No motor, sensory deficits.  Intact deep tendon reflexes. Skin: No rashes or lesions.  No ecchymosis or petechiae. Musculoskeletal: No joint deformity or effusion.     Lab Results: Lab Results  Component Value Date   WBC 6.8 09/27/2018   HGB 13.4 09/27/2018   HCT 40.5 09/27/2018   MCV 91.6 09/27/2018    PLT 178 09/27/2018     Chemistry      Component Value Date/Time   NA 138 05/13/2018 1831   NA 139 03/09/2018 1004   NA 137 11/04/2014 1728   K 4.3 05/13/2018 1831   K 4.2 11/04/2014 1728   CL 107 05/13/2018 1831   CL 103 11/04/2014 1728   CO2 24 05/13/2018 1831   CO2 26 11/04/2014 1728   BUN 11 05/13/2018 1831   BUN 9 03/09/2018 1004   BUN 12 11/04/2014 1728   CREATININE 0.75 05/13/2018 1831   CREATININE 0.43 (L) 07/08/2016 1110      Component Value Date/Time   CALCIUM 9.8 05/13/2018 1831   CALCIUM 9.6 11/04/2014 1728   ALKPHOS 61 03/09/2018 1004   AST 18 03/09/2018 1004   ALT 9 03/09/2018 1004   BILITOT 0.3 03/09/2018 1004        Impression and Plan:  28 year old woman with:   1.  Anemia related to iron deficiency diagnosed in October 2019.  She presented with hemoglobin of 9.1 and ferritin of less than 4.  Her anemia is related to chronic menstrual blood losses.    She is status post intravenous iron infusion with normalization of her hemoglobin.  The treatment options were discussed today including oral iron replacement versus repeat intravenous iron as needed.  After discussion today, plan is to continue to monitor her iron stores periodically and replace intravenous iron as needed.  Her hemoglobin is normal today but her iron studies are pending.  She might require repeat intravenous iron in the next 3 months.  2.  Menorrhagia: I encouraged her to follow-up with OB/GYN regarding chronic menstrual blood losses.  She understands as long as she is having heavy menstrual cycles will continue to require intravenous iron.  3.  Follow-up: We will be in 3 months to an evaluation.  15 minutes was spent with the patient face-to-face today.  More than 50% of time was dedicated to discussing laboratory data, differential diagnosis and treatment options.   Zola Button, MD 3/3/20201:18 PM

## 2018-09-27 NOTE — Telephone Encounter (Signed)
Contacted patient and made aware of results and no need for iron infusion at this time.

## 2018-10-19 ENCOUNTER — Other Ambulatory Visit: Payer: Self-pay

## 2018-10-19 ENCOUNTER — Telehealth (INDEPENDENT_AMBULATORY_CARE_PROVIDER_SITE_OTHER): Payer: 59 | Admitting: Family Medicine

## 2018-10-19 DIAGNOSIS — R195 Other fecal abnormalities: Secondary | ICD-10-CM

## 2018-10-19 DIAGNOSIS — K580 Irritable bowel syndrome with diarrhea: Secondary | ICD-10-CM

## 2018-10-19 DIAGNOSIS — J302 Other seasonal allergic rhinitis: Secondary | ICD-10-CM

## 2018-10-19 DIAGNOSIS — F418 Other specified anxiety disorders: Secondary | ICD-10-CM

## 2018-10-19 MED ORDER — DULOXETINE HCL 30 MG PO CPEP: 30.0000 mg | ORAL_CAPSULE | Freq: Every day | ORAL | 1 refills | Status: DC

## 2018-10-19 MED ORDER — LORATADINE 10 MG PO TABS: 10.0000 mg | ORAL_TABLET | Freq: Every day | ORAL | 11 refills | Status: DC

## 2018-10-19 MED ORDER — CLONAZEPAM 0.5 MG PO TABS: 0.5000 mg | ORAL_TABLET | Freq: Every day | ORAL | 3 refills | Status: DC | PRN

## 2018-10-19 NOTE — Patient Instructions (Signed)
° ° ° °  If you have lab work done today you will be contacted with your lab results within the next 2 weeks.  If you have not heard from us then please contact us. The fastest way to get your results is to register for My Chart. ° ° °IF you received an x-ray today, you will receive an invoice from [facility]. Please contact [facility] at [phone] with questions or concerns regarding your invoice.  ° °IF you received labwork today, you will receive an invoice from [facility]. Please contact [facility] at [phone] with questions or concerns regarding your invoice.  ° °Our billing staff will not be able to assist you with questions regarding bills from these companies. ° °You will be contacted with the lab results as soon as they are available. The fastest way to get your results is to activate your My Chart account. Instructions are located on the last page of this paperwork. If you have not heard from us regarding the results in 2 weeks, please contact this office. °  ° ° ° °

## 2018-10-19 NOTE — Progress Notes (Signed)
CC: Loose stools x 3 days, cough, itchy throat, nasal congestion, ear pressure in both ears and ha x 5 days.  Ha pain 6/10, tried otc ibuprofen that helped more with the ear pressure than ha.  Did try excedrin for ha w/o relief. Needs klonopin refilled.

## 2018-10-19 NOTE — Progress Notes (Signed)
Telemedicine Encounter- SOAP NOTE Established Patient  This telephone encounter was conducted with the patient's (or proxy's) verbal consent via audio telecommunications: yes/no: Yes Patient was instructed to have this encounter in a suitably private space; and to only have persons present to whom they give permission to participate. In addition, patient identity was confirmed by use of name plus two identifiers (DOB and address).  I discussed the limitations, risks, security and privacy concerns of performing an evaluation and management service by telephone and the availability of in person appointments. I also discussed with the patient that there may be a patient responsible charge related to this service. The patient expressed understanding and agreed to proceed.  I spent a total of TIME; 0 MIN TO 60 MIN: 15 minutes talking with the patient or their proxy.  CC:  Loose stools  Subjective   Eileen [name] is a 28 y.o. established patient. Telephone visit today for  HPI Loose Stools/diarrhea/anxiety Patient reports that she has been having diarrhea for 3 days She has BM about 3 times a day She states that the stools are watery or loose She denies fevers but reports chills She states that she gets hot as well as if she broke a fever yesterday  She also gets stomach cramps She denies new foods She reports that she has been having increasing anxiety She is still taking cymbalta and her klonopin She is talking it almost daily   Allergies She also reports that 4 days ago she started having itchy throat, congestion, ear pressure, headache in the whole head that feel like someone is squeezing her head.  She tried excedrin HA which did not help the headache. Ibuprofen helped her ear pain but not her headache.   Her son has strep throat but she has not been around him and she does not feel as if she has strep throat  Patient Active Problem List   Diagnosis Date Noted  . 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 Dysfunction of right eustachian tube 08/29/2018  . Iron deficiency anemia 05/18/2018  . Lower urinary tract infectious disease 02/14/2018  . Encounter for sterilization   . Anemia affecting pregnancy 08/04/2016  . Back pain affecting pregnancy 08/04/2016  . Tachycardia 06/16/2016  . Supervision of normal pregnancy, antepartum 06/09/2016  . Rheumatoid arthritis ([place]) 04/07/2016  . Tobacco abuse 04/07/2016  . Depression with anxiety 11/01/2013  . Anxiety 08/01/2013  . Goiter, unspecified 06/06/2007    Past Medical History:  Diagnosis Date  . Abortion 08/04/2011  . Anemia   . Anxiety   . Autoimmune thyroiditis 09/01/2018   Diagnosed by [facility]  . Cholestasis during pregnancy   . Chronic kidney disease    kidney infection.  . Depression   . Dysuria   . Fibromyalgia   . Fibromyalgia   . Headache(784.0)   . Hypothyroid    history of.  . Mitral valve prolapse   . Neuropathy   . RA (rheumatoid arthritis) (HCC)     Current Outpatient Medications  Medication Sig Dispense Refill  . clonazePAM (KLONOPIN) 0.5 MG tablet Take 1 tablet (0.5 mg total) by mouth daily as needed for anxiety. 30 tablet 3  . DULoxetine (CYMBALTA) 30 MG capsule Take 1 capsule (30 mg total) by mouth daily. 90 capsule 1  . ibuprofen (ADVIL,MOTRIN) 600 MG tablet Take 1 tablet (600 mg total) by mouth every 6 (six) hours as needed. 30 tablet 0  . loratadine (CLARITIN) 10 MG tablet Take 1 tablet (10  mg total) by mouth daily. 30 tablet 11  . lurasidone (LATUDA) 20 MG TABS tablet Take 1 tablet (20 mg total) by mouth daily. (Patient not taking: Reported on 10/19/2018) 90 tablet 1   No current facility-administered medications for this visit.     Allergies  Allergen Reactions  . Eggs Or Egg-Derived Products Other (See Comments)    Stomach pain  . Food Hives, Nausea And Vomiting and Other (See Comments)    Reaction to green beans   . Red Dye Nausea And Vomiting  . Sulfa  Antibiotics Hives and Nausea And Vomiting    Social History   Socioeconomic History  . Marital status: Married    Spouse name: Not on file  . Number of children: 4  . Years of education: Not on file  . Highest education level: Not on file  Occupational History  . Not on file  Social Needs  . Financial resource strain: Not on file  . Food insecurity:    Worry: Not on file    Inability: Not on file  . Transportation needs:    Medical: Not on file    Non-medical: Not on file  Tobacco Use  . Smoking status: Current Every Day Smoker    Packs/day: 1.00    Years: 8.00    Pack years: 8.00    Types: Cigarettes  . Smokeless tobacco: Never Used  Substance and Sexual Activity  . Alcohol use: Yes    Alcohol/week: 0.0 standard drinks    Comment: occasional   . Drug use: No  . Sexual activity: Yes    Partners: Male    Birth control/protection: Surgical    Comment: tubal  Lifestyle  . Physical activity:    Days per week: Not on file    Minutes per session: Not on file  . Stress: Not on file  Relationships  . Social connections:    Talks on phone: Not on file    Gets together: Not on file    Attends religious service: Not on file    Active member of club or organization: Not on file    Attends meetings of clubs or organizations: Not on file    Relationship status: Not on file  . Intimate partner violence:    Fear of current or ex partner: Not on file    Emotionally abused: Not on file    Physically abused: Not on file    Forced sexual activity: Not on file  Other Topics Concern  . Not on file  Social History Narrative  . Not on file    ROS Review of Systems  Constitutional: Negative for activity change, appetite change, chills and fever.  HENT: Negative for congestion, nosebleeds, trouble swallowing and voice change.   Respiratory: Negative for cough, shortness of breath and wheezing.   Gastrointestinal: see hpi  Genitourinary: Negative for difficulty urinating,  dysuria, flank pain and hematuria.  Musculoskeletal: Negative for back pain, joint swelling and neck pain.  Neurological: Negative for dizziness, speech difficulty, light-headedness and numbness.  See HPI. All other review of systems negative.   Objective   Vitals as reported by the patient: There were no vitals filed for this visit.  Diagnoses and all orders for this visit:  Irritable bowel syndrome with diarrhea-  Advised imodium Start meditation and aromatherapy Gave warning signs on infectious diarrhea  Depression with anxiety- continue current meds -     clonazePAM (KLONOPIN) 0.5 MG tablet; Take 1 tablet (0.5 mg total) by mouth daily  as needed for anxiety. -     DULoxetine (CYMBALTA) 30 MG capsule; Take 1 capsule (30 mg total) by mouth daily.  Loose stools- imodium  Seasonal allergies -  Advised antihistamine  Other orders -     loratadine (CLARITIN) 10 MG tablet; Take 1 tablet (10 mg total) by mouth daily.     I discussed the assessment and treatment plan with the patient. The patient was provided an opportunity to ask questions and all were answered. The patient agreed with the plan and demonstrated an understanding of the instructions.   The patient was advised to call back or seek an in-person evaluation if the symptoms worsen or if the condition fails to improve as anticipated.  I provided 15 minutes of non-face-to-face time during this encounter.  Forrest Moron, MD  Primary Care at [facility]

## 2019-01-03 ENCOUNTER — Inpatient Hospital Stay: Payer: 59

## 2019-01-03 ENCOUNTER — Inpatient Hospital Stay: Payer: 59 | Attending: Family Medicine | Admitting: Oncology

## 2019-01-03 ENCOUNTER — Telehealth: Payer: Self-pay | Admitting: Oncology

## 2019-01-03 NOTE — Telephone Encounter (Signed)
Called per 6/8 sch message - unable to reach pt . Left message for patient to call back and reschedule. Apt

## 2019-05-16 ENCOUNTER — Other Ambulatory Visit: Payer: Self-pay

## 2019-05-16 ENCOUNTER — Encounter: Payer: Self-pay | Admitting: Family Medicine

## 2019-05-16 ENCOUNTER — Ambulatory Visit (INDEPENDENT_AMBULATORY_CARE_PROVIDER_SITE_OTHER): Payer: 59 | Admitting: Family Medicine

## 2019-05-16 VITALS — BP 87/60 | HR 101 | Ht 59.0 in | Wt 108.4 lb

## 2019-05-16 DIAGNOSIS — E063 Autoimmune thyroiditis: Secondary | ICD-10-CM

## 2019-05-16 DIAGNOSIS — Z01419 Encounter for gynecological examination (general) (routine) without abnormal findings: Secondary | ICD-10-CM | POA: Diagnosis not present

## 2019-05-16 DIAGNOSIS — Z124 Encounter for screening for malignant neoplasm of cervix: Secondary | ICD-10-CM | POA: Diagnosis not present

## 2019-05-16 DIAGNOSIS — N940 Mittelschmerz: Secondary | ICD-10-CM

## 2019-05-16 DIAGNOSIS — Z3202 Encounter for pregnancy test, result negative: Secondary | ICD-10-CM | POA: Diagnosis not present

## 2019-05-16 DIAGNOSIS — Z1151 Encounter for screening for human papillomavirus (HPV): Secondary | ICD-10-CM

## 2019-05-16 MED ORDER — MEDROXYPROGESTERONE ACETATE 150 MG/ML IM SUSP
150.0000 mg | Freq: Once | INTRAMUSCULAR | Status: AC
  Administered 2019-05-16: 10:00:00 150 mg via INTRAMUSCULAR

## 2019-05-16 MED ORDER — MEDROXYPROGESTERONE ACETATE 150 MG/ML IM SUSP: 150.0000 mg | INTRAMUSCULAR | 3 refills | Status: DC

## 2019-05-16 NOTE — Assessment & Plan Note (Signed)
States it is out of whack, and may be causing some of her symptoms.

## 2019-05-16 NOTE — Progress Notes (Signed)
No sti testing Last pap 2018 Pt. States she is having pain during ovulation beginning when she had her tubes tied in 2018. Pt. States she has cold sweats at night and hot flahses through out the day, this has been going on for the last 4 months.  Pt. Also states she has zero interest in sex and has irregular bleeding.     No flu shot

## 2019-05-16 NOTE — Assessment & Plan Note (Addendum)
Likely on birth control and pregnant and not ovulating until BTL. Underwent dx scope with Dr. Harolyn Rutherford, who found no evidence of PID or endometriosis. Pain is only with ovulation. We are trying Depo to prevent ovulation. She wondered about complete pelvic clean-out, but due to decreased life span, this is not longer recommended.

## 2019-05-16 NOTE — Progress Notes (Signed)
Sti? Pap? Flu?

## 2019-05-16 NOTE — Progress Notes (Signed)
  Subjective:     Eileen [name] is a 28 y.o. female and is here for a comprehensive physical exam. The patient reports problems - since BTL. Has painful ovulation, unable to walk, feels like pulling sensation. Lasts 24 hours. Sore next day. Pain is in lower abdomen. Unable to take a step when occurring. Does not remember having this in the past. Also with weight loss, hot flashes and night sweats.  The following portions of the patient's history were reviewed and updated as appropriate: allergies, current medications, past family history, past medical history, past social history, past surgical history and problem list.  Review of Systems Pertinent items noted in HPI and remainder of comprehensive ROS otherwise negative.   Objective:    BP (!) 87/60   Pulse (!) 101   Ht 4\' 11"  (1.499 m)   Wt 108 lb 6.4 oz (49.2 kg)   LMP 05/02/2019 (Approximate)   BMI 21.89 kg/m  General appearance: alert, cooperative and appears stated age Head: Normocephalic, without obvious abnormality, atraumatic Neck: no adenopathy, supple, symmetrical, trachea midline and thyroid not enlarged, symmetric, no tenderness/mass/nodules Lungs: clear to auscultation bilaterally Breasts: normal appearance, no masses or tenderness Heart: regular rate and rhythm, S1, S2 normal, no murmur, click, rub or gallop Abdomen: soft, non-tender; bowel sounds normal; no masses,  no organomegaly Pelvic: cervix normal in appearance, external genitalia normal, no adnexal masses or tenderness, no cervical motion tenderness, uterus normal size, shape, and consistency, vagina normal without discharge and retroverted uterus Extremities: Homans sign is negative, no sign of DVT Pulses: 2+ and symmetric Skin: Skin color, texture, turgor normal. No rashes or lesions Lymph nodes: Cervical, supraclavicular, and axillary nodes normal. Neurologic: Grossly normal    Assessment:    Healthy female exam.      Plan:   Problem List Items Addressed  This Visit      Unprioritized   Autoimmune thyroiditis    States it is out of whack, and may be causing some of her symptoms.      Ovulation pain - Primary    Likely on birth control and pregnant and not ovulating until BTL. Underwent dx scope with Dr. Harolyn Rutherford, who found no evidence of PID or endometriosis. Pain is only with ovulation. We are trying Depo to prevent ovulation. She wondered about complete pelvic clean-out, but due to decreased life span, this is not longer recommended.      Relevant Medications   medroxyPROGESTERone (DEPO-PROVERA) 150 MG/ML injection    Other Visit Diagnoses    Screening for malignant neoplasm of cervix       Relevant Orders   Cytology - PAP( [facility])   Encounter for gynecological examination without abnormal finding       Relevant Orders   CBC   Comprehensive metabolic panel   TSH   VITAMIN D 25 Hydroxy (Vit-D Deficiency, Fractures)   Lipid panel     Return in 1 year (on 05/15/2020).    See After Visit Summary for Counseling Recommendations

## 2019-05-16 NOTE — Patient Instructions (Signed)
 Preventive Care 21-28 Years Old, Female Preventive care refers to visits with your health care provider and lifestyle choices that can promote health and wellness. This includes:  A yearly physical exam. This may also be called an annual well check.  Regular dental visits and eye exams.  Immunizations.  Screening for certain conditions.  Healthy lifestyle choices, such as eating a healthy diet, getting regular exercise, not using drugs or products that contain nicotine and tobacco, and limiting alcohol use. What can I expect for my preventive care visit? Physical exam Your health care provider will check your:  Height and weight. This may be used to calculate body mass index (BMI), which tells if you are at a healthy weight.  Heart rate and blood pressure.  Skin for abnormal spots. Counseling Your health care provider may ask you questions about your:  Alcohol, tobacco, and drug use.  Emotional well-being.  Home and relationship well-being.  Sexual activity.  Eating habits.  Work and work environment.  Method of birth control.  Menstrual cycle.  Pregnancy history. What immunizations do I need?  Influenza (flu) vaccine  This is recommended every year. Tetanus, diphtheria, and pertussis (Tdap) vaccine  You may need a Td booster every 10 years. Varicella (chickenpox) vaccine  You may need this if you have not been vaccinated. Human papillomavirus (HPV) vaccine  If recommended by your health care provider, you may need three doses over 6 months. Measles, mumps, and rubella (MMR) vaccine  You may need at least one dose of MMR. You may also need a second dose. Meningococcal conjugate (MenACWY) vaccine  One dose is recommended if you are age 19-21 years and a first-year college student living in a residence hall, or if you have one of several medical conditions. You may also need additional booster doses. Pneumococcal conjugate (PCV13) vaccine  You may need  this if you have certain conditions and were not previously vaccinated. Pneumococcal polysaccharide (PPSV23) vaccine  You may need one or two doses if you smoke cigarettes or if you have certain conditions. Hepatitis A vaccine  You may need this if you have certain conditions or if you travel or work in places where you may be exposed to hepatitis A. Hepatitis B vaccine  You may need this if you have certain conditions or if you travel or work in places where you may be exposed to hepatitis B. Haemophilus influenzae type b (Hib) vaccine  You may need this if you have certain conditions. You may receive vaccines as individual doses or as more than one vaccine together in one shot (combination vaccines). Talk with your health care provider about the risks and benefits of combination vaccines. What tests do I need?  Blood tests  Lipid and cholesterol levels. These may be checked every 5 years starting at age 20.  Hepatitis C test.  Hepatitis B test. Screening  Diabetes screening. This is done by checking your blood sugar (glucose) after you have not eaten for a while (fasting).  Sexually transmitted disease (STD) testing.  BRCA-related cancer screening. This may be done if you have a family history of breast, ovarian, tubal, or peritoneal cancers.  Pelvic exam and Pap test. This may be done every 3 years starting at age 21. Starting at age 30, this may be done every 5 years if you have a Pap test in combination with an HPV test. Talk with your health care provider about your test results, treatment options, and if necessary, the need for more   tests. Follow these instructions at home: Eating and drinking   Eat a diet that includes fresh fruits and vegetables, whole grains, lean protein, and low-fat dairy.  Take vitamin and mineral supplements as recommended by your health care provider.  Do not drink alcohol if: ? Your health care provider tells you not to drink. ? You are  pregnant, may be pregnant, or are planning to become pregnant.  If you drink alcohol: ? Limit how much you have to 0-1 drink a day. ? Be aware of how much alcohol is in your drink. In the [place] one drink equals one 12 oz bottle of beer (355 mL), one 5 oz glass of wine (148 mL), or one 1 oz glass of hard liquor (44 mL). Lifestyle  Take daily care of your teeth and gums.  Stay active. Exercise for at least 30 minutes on 5 or more days each week.  Do not use any products that contain nicotine or tobacco, such as cigarettes, e-cigarettes, and chewing tobacco. If you need help quitting, ask your health care provider.  If you are sexually active, practice safe sex. Use a condom or other form of birth control (contraception) in order to prevent pregnancy and STIs (sexually transmitted infections). If you plan to become pregnant, see your health care provider for a preconception visit. What's next?  Visit your health care provider once a year for a well check visit.  Ask your health care provider how often you should have your eyes and teeth checked.  Stay up to date on all vaccines. This information is not intended to replace advice given to you by your health care provider. Make sure you discuss any questions you have with your health care provider. Document Released: 09/08/2001 Document Revised: 03/24/2018 Document Reviewed: 03/24/2018 Elsevier Patient Education  2020 [employer].

## 2019-05-17 LAB — COMPREHENSIVE METABOLIC PANEL
ALT: 13 IU/L (ref 0–32)
AST: 18 IU/L (ref 0–40)
Albumin/Globulin Ratio: 1.8 (ref 1.2–2.2)
Albumin: 4.9 g/dL (ref 3.9–5.0)
Alkaline Phosphatase: 65 IU/L (ref 39–117)
BUN/Creatinine Ratio: 12 (ref 9–23)
BUN: 9 mg/dL (ref 6–20)
Bilirubin Total: 0.6 mg/dL (ref 0.0–1.2)
CO2: 25 mmol/L (ref 20–29)
Calcium: 9.9 mg/dL (ref 8.7–10.2)
Chloride: 102 mmol/L (ref 96–106)
Creatinine, Ser: 0.75 mg/dL (ref 0.57–1.00)
GFR calc Af Amer: 125 mL/min/{1.73_m2} (ref >59)
GFR calc non Af Amer: 109 mL/min/{1.73_m2} (ref >59)
Globulin, Total: 2.7 g/dL (ref 1.5–4.5)
Glucose: 83 mg/dL (ref 65–99)
Potassium: 4.4 mmol/L (ref 3.5–5.2)
Sodium: 140 mmol/L (ref 134–144)
Total Protein: 7.6 g/dL (ref 6.0–8.5)

## 2019-05-17 LAB — CBC
Hematocrit: 42 % (ref 34.0–46.6)
Hemoglobin: 14.3 g/dL (ref 11.1–15.9)
MCH: 29.7 pg (ref 26.6–33.0)
MCHC: 34 g/dL (ref 31.5–35.7)
MCV: 87 fL (ref 79–97)
Platelets: 181 10*3/uL (ref 150–450)
RBC: 4.82 x10E6/uL (ref 3.77–5.28)
RDW: 12.5 % (ref 11.7–15.4)
WBC: 6.5 10*3/uL (ref 3.4–10.8)

## 2019-05-17 LAB — LIPID PANEL
Chol/HDL Ratio: 6 ratio — ABNORMAL HIGH (ref 0.0–4.4)
Cholesterol, Total: 233 mg/dL — ABNORMAL HIGH (ref 100–199)
HDL: 39 mg/dL — ABNORMAL LOW (ref >39)
LDL Chol Calc (NIH): 180 mg/dL — ABNORMAL HIGH (ref 0–99)
Triglycerides: 81 mg/dL (ref 0–149)
VLDL Cholesterol Cal: 14 mg/dL (ref 5–40)

## 2019-05-17 LAB — TSH: TSH: 9.83 u[IU]/mL — ABNORMAL HIGH (ref 0.450–4.500)

## 2019-05-17 LAB — VITAMIN D 25 HYDROXY (VIT D DEFICIENCY, FRACTURES): Vit D, 25-Hydroxy: 25.3 ng/mL — ABNORMAL LOW (ref 30.0–100.0)

## 2019-05-19 LAB — T4, FREE: Free T4: 1.1 ng/dL (ref 0.82–1.77)

## 2019-05-19 LAB — CYTOLOGY - PAP
Comment: NEGATIVE
Comment: NEGATIVE
Comment: NEGATIVE
Diagnosis: NEGATIVE
HPV 16: NEGATIVE
HPV 18 / 45: NEGATIVE
High risk HPV: POSITIVE — AB

## 2019-05-19 LAB — SPECIMEN STATUS REPORT

## 2019-05-19 LAB — T3, FREE: T3, Free: 3.3 pg/mL (ref 2.0–4.4)

## 2019-06-15 ENCOUNTER — Encounter: Payer: Self-pay | Admitting: Emergency Medicine

## 2019-06-15 ENCOUNTER — Other Ambulatory Visit: Payer: Self-pay

## 2019-06-15 ENCOUNTER — Ambulatory Visit
Admission: EM | Admit: 2019-06-15 | Discharge: 2019-06-15 | Disposition: A | Discharge status: Home / Self Care | Payer: 59 | Attending: Nurse Practitioner | Admitting: Nurse Practitioner

## 2019-06-15 DIAGNOSIS — R110 Nausea: Secondary | ICD-10-CM

## 2019-06-15 DIAGNOSIS — R197 Diarrhea, unspecified: Secondary | ICD-10-CM

## 2019-06-15 DIAGNOSIS — R109 Unspecified abdominal pain: Secondary | ICD-10-CM

## 2019-06-15 DIAGNOSIS — R630 Anorexia: Secondary | ICD-10-CM

## 2019-06-15 LAB — POCT URINALYSIS DIP (MANUAL ENTRY)
Bilirubin, UA: NEGATIVE
Glucose, UA: NEGATIVE mg/dL
Ketones, POC UA: NEGATIVE mg/dL
Leukocytes, UA: NEGATIVE
Nitrite, UA: NEGATIVE
Protein Ur, POC: NEGATIVE mg/dL
Spec Grav, UA: 1.025 (ref 1.010–1.025)
Urobilinogen, UA: 0.2 E.U./dL
pH, UA: 7.5 (ref 5.0–8.0)

## 2019-06-15 MED ORDER — CIPROFLOXACIN HCL 500 MG PO TABS: 500.0000 mg | ORAL_TABLET | Freq: Two times a day (BID) | ORAL | 0 refills | Status: DC

## 2019-06-15 NOTE — ED Triage Notes (Addendum)
Pt presents to [facility] for assessment after she left a tampon in for over 24 hours.  Patient states she has since removed, but is now having diarrhea, nausea, and "just not feeling right".  States she feels "out of it".  Pt c/o spotty vaginal bleeding "even though I should have stopped by now".

## 2019-06-15 NOTE — ED Provider Notes (Signed)
[facility]    CSN: [id number] Arrival date & time: 06/15/19  1103      History   Chief Complaint Chief Complaint  Patient presents with  . Diarrhea  . Nausea    HPI Eileen [name] is a 28 y.o. female.   Subjective:   Eileen [name] is a 28 y.o. female who presents for evaluation of abdominal pain. The pain is described as cramping and dull, and is 5/10 in intensity. Pain is located in the suprapubic without radiation. Onset was 2 days ago. Symptoms have been unchanged since. Aggravating factors: none.  Alleviating factors: none. Associated symptoms: anorexia, diarrhea and nausea. The patient denies constipation, fever, myalgias or malaise. Notably, patient is currently on her menses and reports that she left a tampon in for over 24 hours (by accident). She has bad vaginal odor and some light brown discharge in addition to her menses flow. She is concerned about TSS. Denies any known exposure to COVID-19.   The following portions of the patient's history were reviewed and updated as appropriate: allergies, current medications, past family history, past medical history, past social history, past surgical history and problem list.         Past Medical History:  Diagnosis Date  . Abortion 08/04/2011  . Anemia   . Anxiety   . Autoimmune thyroiditis 09/01/2018   Diagnosed by [facility]  . Cholestasis during pregnancy   . Chronic kidney disease    kidney infection.  . Depression   . Dysuria   . Fibromyalgia   . Fibromyalgia   . Headache(784.0)   . Hypothyroid    history of.  . Mitral valve prolapse   . Neuropathy   . RA (rheumatoid arthritis) [facility])     Patient Active Problem List   Diagnosis Date Noted  . Ovulation pain 05/16/2019  . Autoimmune thyroiditis 09/01/2018  . Dysfunction of right eustachian tube 08/29/2018  . Iron deficiency anemia 05/18/2018  . Lower urinary tract infectious disease 02/14/2018  . Tachycardia 06/16/2016  .  Supervision of normal pregnancy, antepartum 06/09/2016  . Rheumatoid arthritis ([place]) 04/07/2016  . Tobacco abuse 04/07/2016  . Depression with anxiety 11/01/2013  . Anxiety 08/01/2013  . Goiter, unspecified 06/06/2007    Past Surgical History:  Procedure Laterality Date  . DILATION AND CURETTAGE OF UTERUS    . LAPAROSCOPY N/A 05/25/2014   Procedure: LAPAROSCOPY DIAGNOSTIC;  Surgeon: Osborne Oman, MD;  Location: [facility];  Service: Gynecology;  Laterality: N/A;  . THERAPEUTIC ABORTION N/A 2012  . TUBAL LIGATION Bilateral 10/22/2016   Procedure: POST PARTUM TUBAL LIGATION;  Surgeon: Mora Bellman, MD;  Location: [facility];  Service: Gynecology;  Laterality: Bilateral;    OB History    Gravida  6   Para  4   Term  4   Preterm  0   AB  2   Living  4     SAB      TAB  2   Ectopic  0   Multiple  0   Live Births  4            Home Medications    Prior to Admission medications   Medication Sig Start Date End Date Taking? Authorizing Provider  clonazePAM (KLONOPIN) 0.5 MG tablet Take 1 tablet (0.5 mg total) by mouth daily as needed for anxiety. Patient not taking: Reported on 05/16/2019 10/19/18   Forrest Moron, MD  DULoxetine (CYMBALTA) 30 MG capsule Take 1 capsule (30 mg  total) by mouth daily. 10/19/18 01/17/19  Forrest Moron, MD  ibuprofen (ADVIL,MOTRIN) 600 MG tablet Take 1 tablet (600 mg total) by mouth every 6 (six) hours as needed. Patient not taking: Reported on 05/16/2019 08/29/18   Wieters, Hallie C, PA-C  loratadine (CLARITIN) 10 MG tablet Take 1 tablet (10 mg total) by mouth daily. Patient not taking: Reported on 05/16/2019 10/19/18   Forrest Moron, MD  lurasidone (LATUDA) 20 MG TABS tablet Take 1 tablet (20 mg total) by mouth daily. Patient not taking: Reported on 10/19/2018 08/23/18 11/21/18  Horald Pollen, MD  medroxyPROGESTERone (DEPO-PROVERA) 150 MG/ML injection Inject 1 mL (150 mg total) into the muscle every 3 (three) months. 05/16/19    Donnamae Jude, MD    Family History Family History  Problem Relation Age of Onset  . Cancer Mother        SKIN  . Hyperlipidemia Mother   . Thyroid disease Mother   . Heart disease Mother   . Mitral valve prolapse Mother   . Heart attack Mother   . Asthma Brother   . Bipolar disorder Brother   . Diabetes Maternal Grandmother   . Hyperlipidemia Maternal Grandmother   . Emphysema Paternal Grandfather     Social History Social History   Tobacco Use  . Smoking status: Current Every Day Smoker    Packs/day: 1.00    Years: 8.00    Pack years: 8.00    Types: Cigarettes  . Smokeless tobacco: Never Used  Substance Use Topics  . Alcohol use: Yes    Alcohol/week: 0.0 standard drinks    Comment: occasional   . Drug use: No     Allergies   Eggs or egg-derived products, Food, Red dye, and Sulfa antibiotics   Review of Systems Review of Systems   Physical Exam Triage Vital Signs ED Triage Vitals  Enc Vitals Group     BP 06/15/19 1110 106/75     Pulse Rate 06/15/19 1110 91     Resp 06/15/19 1110 18     Temp 06/15/19 1110 (!) 97.4 F (36.3 C)     Temp Source 06/15/19 1110 Temporal     SpO2 06/15/19 1110 98 %     Weight --      Height --      Head Circumference --      Peak Flow --      Pain Score 06/15/19 1109 6     Pain Loc --      Pain Edu? --      Excl. in [place]? --    No data found.  Updated Vital Signs BP 106/75 (BP Location: Left Arm)   Pulse 91   Temp (!) 97.4 F (36.3 C) (Temporal)   Resp 18   LMP 06/05/2019   SpO2 98%   Visual Acuity Right Eye Distance:   Left Eye Distance:   Bilateral Distance:    Right Eye Near:   Left Eye Near:    Bilateral Near:     Physical Exam   UC Treatments / Results  Labs (all labs ordered are listed, but only abnormal results are displayed) Labs Reviewed  POCT URINALYSIS DIP (MANUAL ENTRY)    EKG   Radiology No results found.  Procedures Procedures (including critical care time)  Medications  Ordered in UC Medications - No data to display  Initial Impression / Assessment and Plan / UC Course  I have reviewed the triage vital signs and the nursing notes.  Pertinent labs & imaging results that were available during my care of the patient were reviewed by me and considered in my medical decision making (see chart for details).    28 yo female presenting with a two-day history of suprapubic abdominal pain, anorexia, diarrhea and nausea.   The diagnosis was discussed with the patient and evaluation and treatment plans outlined. Reassured patient that symptoms are almost certainly benign and self-resolving. Follow up as needed.  Today's evaluation has revealed no signs of a dangerous process. Discussed diagnosis with patient and/or guardian. Patient and/or guardian aware of their diagnosis, possible red flag symptoms to watch out for and need for close follow up. Patient and/or guardian understands verbal and written discharge instructions. Patient and/or guardian comfortable with plan and disposition.  Patient and/or guardian has a clear mental status at this time, good insight into illness (after discussion and teaching) and has clear judgment to make decisions regarding their care  This care was provided during an unprecedented National Emergency due to the Novel Coronavirus (COVID-19) pandemic. COVID-19 infections and transmission risks place heavy strains on healthcare resources.  As this pandemic evolves, our facility, providers, and staff strive to respond fluidly, to remain operational, and to provide care relative to available resources and information. Outcomes are unpredictable and treatments are without well-defined guidelines. Further, the impact of COVID-19 on all aspects of urgent care, including the impact to patients seeking care for reasons other than COVID-19, is unavoidable during this national emergency. At this time of the global pandemic, management of patients has  significantly changed, even for non-COVID positive patients given high local and regional COVID volumes at this time requiring high healthcare system and resource utilization. The standard of care for management of both COVID suspected and non-COVID suspected patients continues to change rapidly at the local, regional, national, and global levels. This patient was worked up and treated to the best available but ever changing evidence and resources available at this current time.   Documentation was completed with the aid of voice recognition software. Transcription may contain typographical errors.  Final Clinical Impressions(s) / UC Diagnoses   Final diagnoses:  None   Discharge Instructions   None    ED Prescriptions    None     PDMP not reviewed this encounter.   Enrique Sack, [place] 06/15/19 1140

## 2019-06-15 NOTE — ED Notes (Signed)
Patient able to ambulate independently  

## 2019-10-02 ENCOUNTER — Ambulatory Visit
Admission: EM | Admit: 2019-10-02 | Discharge: 2019-10-02 | Disposition: A | Discharge status: Home / Self Care | Payer: 59 | Attending: Emergency Medicine | Admitting: Emergency Medicine

## 2019-10-02 DIAGNOSIS — Z7251 High risk heterosexual behavior: Secondary | ICD-10-CM

## 2019-10-02 DIAGNOSIS — M542 Cervicalgia: Secondary | ICD-10-CM

## 2019-10-02 DIAGNOSIS — Z113 Encounter for screening for infections with a predominantly sexual mode of transmission: Secondary | ICD-10-CM | POA: Diagnosis not present

## 2019-10-02 DIAGNOSIS — Z114 Encounter for screening for human immunodeficiency virus [HIV]: Secondary | ICD-10-CM | POA: Diagnosis not present

## 2019-10-02 MED ORDER — ALUM & MAG HYDROXIDE-SIMETH 200-200-20 MG/5ML PO SUSP
30.0000 mL | Freq: Once | ORAL | Status: AC
  Administered 2019-10-02: 30 mL via ORAL

## 2019-10-02 MED ORDER — LIDOCAINE VISCOUS HCL 2 % MT SOLN
15.0000 mL | Freq: Once | OROMUCOSAL | Status: AC
  Administered 2019-10-02: 16:00:00 15 mL via ORAL

## 2019-10-02 MED ORDER — METHOCARBAMOL 500 MG PO TABS: 500.0000 mg | ORAL_TABLET | Freq: Two times a day (BID) | ORAL | 0 refills | Status: AC

## 2019-10-02 MED ORDER — NAPROXEN 500 MG PO TABS: 500.0000 mg | ORAL_TABLET | Freq: Two times a day (BID) | ORAL | 0 refills | Status: DC

## 2019-10-02 NOTE — ED Provider Notes (Signed)
[facility]    CSN: [id number] Arrival date & time: 10/02/19  1501      History   Chief Complaint Chief Complaint  Patient presents with  . Sore Throat    HPI Eileen [name] is a 29 y.o. female with history of Hashimoto's thyroiditis, hypothyroidism, goiter, fibromyalgia, RA presenting for throat discomfort for the last week.  Patient endorsing foreign body sensation: Denying throat swelling, difficulty breathing or swallowing, drooling.  Denies ear or nose pain, sinus pressure, fever, neck stiffness, cough, chest pain or palpitations.  Denies nausea, vomiting, abdominal pain or diarrhea.  Has had some posterior neck pain for last 5 days: Denies injury or trauma to the area.  Unsure if she slept weird as she noticed it upon awakening 1 day.  Tried 800 mg ibuprofen once without significant relief.  Reports being up-to-date on vaccinations.  Denies known sick contacts.  Of note, patient is not currently on levothyroxine.  Last saw her endocrinologist 6 months ago: Unsure when she is following up.  Denies change to hair, skin, nails, bowel movements. Patient also requesting HIV testing.  States that she recently had unprotected sex.  States she is currently sexually active with more than 1 partner, usually does wear condoms.  Denies known exposure to STI.  No pelvic or abdominal pain, vaginal discharge or malodor.  No urinary symptoms.  Denies history of HIV, RPR.  Past Medical History:  Diagnosis Date  . Abortion 08/04/2011  . Anemia   . Anxiety   . Autoimmune thyroiditis 09/01/2018   Diagnosed by [facility]  . Cholestasis during pregnancy   . Chronic kidney disease    kidney infection.  . Depression   . Dysuria   . Fibromyalgia   . Fibromyalgia   . Headache(784.0)   . Hypothyroid    history of.  . Mitral valve prolapse   . Neuropathy   . RA (rheumatoid arthritis) [facility])     Patient Active Problem List   Diagnosis Date Noted  . Ovulation pain 05/16/2019   . Autoimmune thyroiditis 09/01/2018  . Dysfunction of right eustachian tube 08/29/2018  . Iron deficiency anemia 05/18/2018  . Lower urinary tract infectious disease 02/14/2018  . Tachycardia 06/16/2016  . Supervision of normal pregnancy, antepartum 06/09/2016  . Rheumatoid arthritis ([place]) 04/07/2016  . Tobacco abuse 04/07/2016  . Depression with anxiety 11/01/2013  . Anxiety 08/01/2013  . Goiter, unspecified 06/06/2007    Past Surgical History:  Procedure Laterality Date  . DILATION AND CURETTAGE OF UTERUS    . LAPAROSCOPY N/A 05/25/2014   Procedure: LAPAROSCOPY DIAGNOSTIC;  Surgeon: Osborne Oman, MD;  Location: [facility];  Service: Gynecology;  Laterality: N/A;  . THERAPEUTIC ABORTION N/A 2012  . TUBAL LIGATION Bilateral 10/22/2016   Procedure: POST PARTUM TUBAL LIGATION;  Surgeon: Mora Bellman, MD;  Location: [facility];  Service: Gynecology;  Laterality: Bilateral;    OB History    Gravida  6   Para  4   Term  4   Preterm  0   AB  2   Living  4     SAB      TAB  2   Ectopic  0   Multiple  0   Live Births  4            Home Medications    Prior to Admission medications   Medication Sig Start Date End Date Taking? Authorizing Provider  methocarbamol (ROBAXIN) 500 MG tablet Take 1 tablet (500  mg total) by mouth 2 (two) times daily for 5 days. 10/02/19 10/07/19  Hall-Potvin, [place], PA-C  naproxen (NAPROSYN) 500 MG tablet Take 1 tablet (500 mg total) by mouth 2 (two) times daily. 10/02/19   Hall-Potvin, [place], PA-C    Family History Family History  Problem Relation Age of Onset  . Cancer Mother        SKIN  . Hyperlipidemia Mother   . Thyroid disease Mother   . Heart disease Mother   . Mitral valve prolapse Mother   . Heart attack Mother   . Asthma Brother   . Bipolar disorder Brother   . Diabetes Maternal Grandmother   . Hyperlipidemia Maternal Grandmother   . Emphysema Paternal Grandfather     Social History Social History   Tobacco  Use  . Smoking status: Current Every Day Smoker    Packs/day: 1.00    Years: 8.00    Pack years: 8.00    Types: Cigarettes  . Smokeless tobacco: Never Used  Substance Use Topics  . Alcohol use: Not Currently    Alcohol/week: 0.0 standard drinks    Comment: occasional   . Drug use: No     Allergies   Eggs or egg-derived products, Food, Red dye, and Sulfa antibiotics   Review of Systems As per HPI   Physical Exam Triage Vital Signs ED Triage Vitals  Enc Vitals Group     BP      Pulse      Resp      Temp      Temp src      SpO2      Weight      Height      Head Circumference      Peak Flow      Pain Score      Pain Loc      Pain Edu?      Excl. in [place]?    No data found.  Updated Vital Signs BP 109/78 (BP Location: Left Arm)   Pulse (!) 105   Temp 98.9 F (37.2 C) (Oral)   Resp 16   LMP 09/30/2019   SpO2 98%   Visual Acuity Right Eye Distance:   Left Eye Distance:   Bilateral Distance:    Right Eye Near:   Left Eye Near:    Bilateral Near:     Physical Exam Constitutional:      General: She is not in acute distress.    Appearance: She is normal weight. She is not ill-appearing or diaphoretic.  HENT:     Head: Normocephalic and atraumatic.     Jaw: There is normal jaw occlusion. No tenderness or pain on movement.     Right Ear: Hearing, tympanic membrane, ear canal and external ear normal. No tenderness. No mastoid tenderness.     Left Ear: Hearing, tympanic membrane, ear canal and external ear normal. No tenderness. No mastoid tenderness.     Nose: No nasal deformity, septal deviation or nasal tenderness.     Right Turbinates: Not swollen or pale.     Left Turbinates: Not swollen or pale.     Right Sinus: No maxillary sinus tenderness or frontal sinus tenderness.     Left Sinus: No maxillary sinus tenderness or frontal sinus tenderness.     Mouth/Throat:     Lips: Pink. No lesions.     Mouth: Mucous membranes are moist. No injury.     Pharynx:  Oropharynx is clear. Uvula midline. No pharyngeal  swelling, posterior oropharyngeal erythema or uvula swelling.     Tonsils: No tonsillar exudate or tonsillar abscesses. 1+ on the right. 1+ on the left.  Eyes:     Conjunctiva/sclera: Conjunctivae normal.     Pupils: Pupils are equal, round, and reactive to light.  Neck:     Thyroid: Thyromegaly present.     Comments: Mild thyromegaly without appreciable nodules or tenderness.  Trachea midline: No stridor.  Negative Brudzinski Cardiovascular:     Rate and Rhythm: Normal rate and regular rhythm.     Comments: HR 95-97 at bedside Pulmonary:     Effort: Pulmonary effort is normal. No respiratory distress.     Breath sounds: No wheezing.  Musculoskeletal:     Cervical back: Normal range of motion and neck supple. No muscular tenderness.  Lymphadenopathy:     Cervical: No cervical adenopathy.  Skin:    General: Skin is warm.     Capillary Refill: Capillary refill takes less than 2 seconds.     Coloration: Skin is not pale.     Findings: No rash.  Neurological:     Mental Status: She is alert and oriented to person, place, and time.      UC Treatments / Results  Labs (all labs ordered are listed, but only abnormal results are displayed) Labs Reviewed  HIV ANTIBODY (ROUTINE TESTING W REFLEX)  RPR    EKG   Radiology No results found.  Procedures Procedures (including critical care time)  Medications Ordered in UC Medications  alum & mag hydroxide-simeth (MAALOX/MYLANTA) 200-200-20 MG/5ML suspension 30 mL (30 mLs Oral Given 10/02/19 1536)    And  lidocaine (XYLOCAINE) 2 % viscous mouth solution 15 mL (15 mLs Oral Given 10/02/19 1536)    Initial Impression / Assessment and Plan / UC Course  I have reviewed the triage vital signs and the nursing notes.  Pertinent labs & imaging results that were available during my care of the patient were reviewed by me and considered in my medical decision making (see chart for details).      Patient afebrile, nontoxic and without nuchal rigidity.  Up-to-date on immunizations.  Low concern for meningitis at this time.  Patient does have significant thyroid history with chronic noncompliance of medications.  Without systemic symptoms, or exam findings concerning for airway compromise.  Does have goiter, though states size is stable for her.  Stressed importance of following up with endocrinology for further evaluation and management if needed.  Patient given GI cocktail with lidocaine for foreign body sensation: Patient reporting resolution of this at time of discharge.  Uvula is not edematous, recent symmetrically and is without erythema: Rapid strep deferred at this time.  Posterior neck pain likely musculoskeletal: We will treat with NSAID, muscle relaxer as outlined below.  Provided patient with PCP contact information as she did not like her previous.  Patient to call endocrinology later today to schedule follow-up appointment.  Offered G/C, trichomonas testing: Patient declined.  HIV, RPR pending: Denies history thereof.  Return precautions discussed, patient verbalized understanding and is agreeable to plan. Final Clinical Impressions(s) / UC Diagnoses   Final diagnoses:  Neck pain  Screening for human immunodeficiency virus  Unprotected sex  Screening examination for venereal disease     Discharge Instructions     Recommend RICE: rest, ice, compression, elevation as needed for pain.    Heat therapy (hot compress, warm wash red, hot showers, etc.) can help relax muscles and soothe muscle aches. Cold therapy (ice  packs) can be used to help swelling both after injury and after prolonged use of areas of chronic pain/aches.  For pain: naprosyn as directed, may supplement with Tylenol.  May take muscle relaxer as needed for severe pain / spasm.  (This medication may cause you to become tired so it is important you do not drink alcohol or operate heavy machinery while on this  medication.  Recommend your first dose to be taken before bedtime to monitor for side effects safely)  Return for worsening pain, difficulty swallowing, fever, chills, difficulty breathing.    ED Prescriptions    Medication Sig Dispense Auth. Provider   naproxen (NAPROSYN) 500 MG tablet Take 1 tablet (500 mg total) by mouth 2 (two) times daily. 30 tablet Hall-Potvin, [place], PA-C   methocarbamol (ROBAXIN) 500 MG tablet Take 1 tablet (500 mg total) by mouth 2 (two) times daily for 5 days. 10 tablet Hall-Potvin, [place], PA-C     PDMP not reviewed this encounter.   Hall-Potvin, [place], [place] 10/02/19 1546

## 2019-10-02 NOTE — ED Triage Notes (Signed)
Pt c/o a sore irritation to throat x1wk. States feels like a hair is in her throat and it feels thick. Pt also c/o neck pain x5 days with no injury. Pt denies cough.

## 2019-10-02 NOTE — Discharge Instructions (Addendum)
Recommend RICE: rest, ice, compression, elevation as needed for pain.    Heat therapy (hot compress, warm wash red, hot showers, etc.) can help relax muscles and soothe muscle aches. Cold therapy (ice packs) can be used to help swelling both after injury and after prolonged use of areas of chronic pain/aches.  For pain: naprosyn as directed, may supplement with Tylenol.  May take muscle relaxer as needed for severe pain / spasm.  (This medication may cause you to become tired so it is important you do not drink alcohol or operate heavy machinery while on this medication.  Recommend your first dose to be taken before bedtime to monitor for side effects safely)  Return for worsening pain, difficulty swallowing, fever, chills, difficulty breathing.

## 2019-10-03 LAB — HIV ANTIBODY (ROUTINE TESTING W REFLEX): HIV Screen 4th Generation wRfx: NONREACTIVE

## 2019-10-03 LAB — RPR: RPR Ser Ql: NONREACTIVE

## 2019-10-14 ENCOUNTER — Ambulatory Visit
Admission: EM | Admit: 2019-10-14 | Discharge: 2019-10-14 | Disposition: A | Discharge status: Home / Self Care | Payer: 59 | Attending: Physician Assistant | Admitting: Physician Assistant

## 2019-10-14 ENCOUNTER — Other Ambulatory Visit: Payer: Self-pay

## 2019-10-14 DIAGNOSIS — Z113 Encounter for screening for infections with a predominantly sexual mode of transmission: Secondary | ICD-10-CM

## 2019-10-14 NOTE — ED Provider Notes (Signed)
[facility]    CSN: [id number] Arrival date & time: 10/14/19  0911      History   Chief Complaint Chief Complaint  Patient presents with  . Requesting HIV testing    HPI Eileen [name] is a 29 y.o. female.   29 year old female comes in for HIV testing. States states she was tested 13 days ago after unprotected intercourse. However, she has had sore throat, bilateral ear pain, feeling itchy all over and would like to retest HIV after googling symptoms.   Patient declined ROS/exam for further workup of current symptoms, stating would just like HIV testing.      Past Medical History:  Diagnosis Date  . Abortion 08/04/2011  . Anemia   . Anxiety   . Autoimmune thyroiditis 09/01/2018   Diagnosed by [facility]  . Cholestasis during pregnancy   . Chronic kidney disease    kidney infection.  . Depression   . Dysuria   . Fibromyalgia   . Fibromyalgia   . Headache(784.0)   . Hypothyroid    history of.  . Mitral valve prolapse   . Neuropathy   . RA (rheumatoid arthritis) [facility])     Patient Active Problem List   Diagnosis Date Noted  . Ovulation pain 05/16/2019  . Autoimmune thyroiditis 09/01/2018  . Dysfunction of right eustachian tube 08/29/2018  . Iron deficiency anemia 05/18/2018  . Lower urinary tract infectious disease 02/14/2018  . Tachycardia 06/16/2016  . Supervision of normal pregnancy, antepartum 06/09/2016  . Rheumatoid arthritis ([place]) 04/07/2016  . Tobacco abuse 04/07/2016  . Depression with anxiety 11/01/2013  . Anxiety 08/01/2013  . Goiter, unspecified 06/06/2007    Past Surgical History:  Procedure Laterality Date  . DILATION AND CURETTAGE OF UTERUS    . LAPAROSCOPY N/A 05/25/2014   Procedure: LAPAROSCOPY DIAGNOSTIC;  Surgeon: Osborne Oman, MD;  Location: [facility];  Service: Gynecology;  Laterality: N/A;  . THERAPEUTIC ABORTION N/A 2012  . TUBAL LIGATION Bilateral 10/22/2016   Procedure: POST PARTUM TUBAL LIGATION;   Surgeon: Mora Bellman, MD;  Location: [facility];  Service: Gynecology;  Laterality: Bilateral;    OB History    Gravida  6   Para  4   Term  4   Preterm  0   AB  2   Living  4     SAB      TAB  2   Ectopic  0   Multiple  0   Live Births  4            Home Medications    Prior to Admission medications   Not on File    Family History Family History  Problem Relation Age of Onset  . Cancer Mother        SKIN  . Hyperlipidemia Mother   . Thyroid disease Mother   . Heart disease Mother   . Mitral valve prolapse Mother   . Heart attack Mother   . Asthma Brother   . Bipolar disorder Brother   . Diabetes Maternal Grandmother   . Hyperlipidemia Maternal Grandmother   . Emphysema Paternal Grandfather     Social History Social History   Tobacco Use  . Smoking status: Current Every Day Smoker    Packs/day: 1.00    Years: 8.00    Pack years: 8.00    Types: Cigarettes  . Smokeless tobacco: Never Used  Substance Use Topics  . Alcohol use: Not Currently    Alcohol/week:  0.0 standard drinks    Comment: occasional   . Drug use: No     Allergies   Eggs or egg-derived products, Food, Red dye, and Sulfa antibiotics   Review of Systems Review of Systems  Reason unable to perform ROS: See HPI as above.     Physical Exam Triage Vital Signs ED Triage Vitals  Enc Vitals Group     BP 10/14/19 0921 103/73     Pulse Rate 10/14/19 0921 (!) 113     Resp 10/14/19 0921 18     Temp 10/14/19 0921 98 F (36.7 C)     Temp Source 10/14/19 0921 Oral     SpO2 10/14/19 0921 98 %     Weight --      Height --      Head Circumference --      Peak Flow --      Pain Score 10/14/19 0929 0     Pain Loc --      Pain Edu? --      Excl. in [place]? --    No data found.  Updated Vital Signs BP 103/73 (BP Location: Left Arm)   Pulse (!) 113   Temp 98 F (36.7 C) (Oral)   Resp 18   LMP 09/30/2019   SpO2 98%   Physical Exam Constitutional:      General: She is  not in acute distress.    Appearance: Normal appearance. She is well-developed. She is not toxic-appearing or diaphoretic.  HENT:     Head: Normocephalic and atraumatic.  Eyes:     Conjunctiva/sclera: Conjunctivae normal.     Pupils: Pupils are equal, round, and reactive to light.  Pulmonary:     Effort: Pulmonary effort is normal. No respiratory distress.     Comments: Speaking in full sentences without difficulty Musculoskeletal:     Cervical back: Normal range of motion and neck supple.  Skin:    General: Skin is warm and dry.  Neurological:     Mental Status: She is alert and oriented to person, place, and time.      UC Treatments / Results  Labs (all labs ordered are listed, but only abnormal results are displayed) Labs Reviewed  HIV ANTIBODY (ROUTINE TESTING W REFLEX)    EKG   Radiology No results found.  Procedures Procedures (including critical care time)  Medications Ordered in UC Medications - No data to display  Initial Impression / Assessment and Plan / UC Course  I have reviewed the triage vital signs and the nursing notes.  Pertinent labs & imaging results that were available during my care of the patient were reviewed by me and considered in my medical decision making (see chart for details).    HIV testing ordered. Discussed if exposed, this current test may still be too early. Will need to follow up with PCP for further monitoring and retesting as appropriate. Patient expresses understanding.  Final Clinical Impressions(s) / UC Diagnoses   Final diagnoses:  Screen for STD (sexually transmitted disease)    ED Prescriptions    None     PDMP not reviewed this encounter.   Ok Edwards, PA-C 10/14/19 [phone]

## 2019-10-14 NOTE — Discharge Instructions (Signed)
HIV testing sent, you will be contacted with any positive results. Otherwise, please follow up with PCP for further monitoring needed.

## 2019-10-14 NOTE — ED Triage Notes (Signed)
Patient presents requesting to have HIV testing done.  She states she was tested 13 days ago after an unprotected sexual encounter.  She reports symptoms of sore throat, bilateral ear pain, feeling itchy all over and spots on her face.

## 2019-10-15 LAB — HIV ANTIBODY (ROUTINE TESTING W REFLEX): HIV Screen 4th Generation wRfx: NONREACTIVE

## 2019-11-28 ENCOUNTER — Ambulatory Visit (INDEPENDENT_AMBULATORY_CARE_PROVIDER_SITE_OTHER): Payer: 59 | Admitting: Obstetrics & Gynecology

## 2019-11-28 ENCOUNTER — Encounter: Payer: Self-pay | Admitting: Obstetrics & Gynecology

## 2019-11-28 ENCOUNTER — Other Ambulatory Visit: Payer: Self-pay

## 2019-11-28 VITALS — BP 104/72 | HR 98 | Ht <= 58 in | Wt 108.4 lb

## 2019-11-28 DIAGNOSIS — E063 Autoimmune thyroiditis: Secondary | ICD-10-CM | POA: Diagnosis not present

## 2019-11-28 DIAGNOSIS — N938 Other specified abnormal uterine and vaginal bleeding: Secondary | ICD-10-CM | POA: Diagnosis not present

## 2019-11-28 NOTE — Progress Notes (Signed)
[name], female   DOB: 04-May-1991, 29 y.o.   MRN: [id number]  Chief Complaint  Patient presents with  . GYN  Irregular vaginal bleeding for 3 weeks,   HPI Eileen [name] is a 29 y.o. female.  [id number] Patient's last menstrual period was 11/08/2019. Since her last period she has had irregular spotting and some heavier flow in last 2 days. Her menses were normal previously, s/p BTL. Last pap 04/2019 reviewed with her. She has several other somatic complaints. Her PCP recommended f/u here. She has a endocrinologist as well. HPI  Past Medical History:  Diagnosis Date  . Abortion 08/04/2011  . Anemia   . Anxiety   . Autoimmune thyroiditis 09/01/2018   Diagnosed by [facility]  . Cholestasis during pregnancy   . Chronic kidney disease    kidney infection.  . Depression   . Dysuria   . Fibromyalgia   . Fibromyalgia   . Headache(784.0)   . Hypothyroid    history of.  . Mitral valve prolapse   . Neuropathy   . RA (rheumatoid arthritis) ([place])     Past Surgical History:  Procedure Laterality Date  . DILATION AND CURETTAGE OF UTERUS    . LAPAROSCOPY N/A 05/25/2014   Procedure: LAPAROSCOPY DIAGNOSTIC;  Surgeon: Osborne Oman, MD;  Location: [facility];  Service: Gynecology;  Laterality: N/A;  . THERAPEUTIC ABORTION N/A 2012  . TUBAL LIGATION Bilateral 10/22/2016   Procedure: POST PARTUM TUBAL LIGATION;  Surgeon: Mora Bellman, MD;  Location: [facility];  Service: Gynecology;  Laterality: Bilateral;    Family History  Problem Relation Age of Onset  . Cancer Mother        SKIN  . Hyperlipidemia Mother   . Thyroid disease Mother   . Heart disease Mother   . Mitral valve prolapse Mother   . Heart attack Mother   . Asthma Brother   . Bipolar disorder Brother   . Diabetes Maternal Grandmother   . Hyperlipidemia Maternal Grandmother   . Emphysema Paternal Grandfather     Social History Social History   Tobacco Use  . Smoking status: Current Every Day  Smoker    Packs/day: 1.00    Years: 8.00    Pack years: 8.00    Types: Cigarettes  . Smokeless tobacco: Never Used  Substance Use Topics  . Alcohol use: Not Currently    Alcohol/week: 0.0 standard drinks    Comment: occasional   . Drug use: No    Allergies  Allergen Reactions  . Eggs Or Egg-Derived Products Other (See Comments)    Stomach pain  . Food Hives, Nausea And Vomiting and Other (See Comments)    Reaction to green beans   . Red Dye Nausea And Vomiting  . Sulfa Antibiotics Hives and Nausea And Vomiting    Current Outpatient Medications  Medication Sig Dispense Refill  . levothyroxine (SYNTHROID) 25 MCG tablet Take 25 mcg by mouth daily before breakfast.     No current facility-administered medications for this visit.    Review of Systems Review of Systems  Constitutional: Positive for fatigue.  Genitourinary: Positive for menstrual problem and vaginal bleeding. Negative for dysuria and frequency.       Nipple discharge  Skin: Positive for rash (fine red).       Hair loss  Neurological: Positive for headaches.       Absent minded  Psychiatric/Behavioral: Positive for decreased concentration and dysphoric mood.    Blood pressure 104/72, pulse 98,  height 4\' 10"  (1.473 m), weight 108 lb 6.4 oz (49.2 kg), last menstrual period 11/08/2019.  Physical Exam Physical Exam Constitutional:      Appearance: Normal appearance.  HENT:     Head: Normocephalic.  Pulmonary:     Effort: Pulmonary effort is normal.  Genitourinary:    Comments: deferred Neurological:     General: No focal deficit present.     Mental Status: She is alert.  Psychiatric:        Mood and Affect: Mood normal.        Behavior: Behavior normal.     Data Reviewed CBC    Component Value Date/Time   WBC 6.5 05/16/2019 1024   WBC 6.8 09/27/2018 1249   WBC 7.1 05/13/2018 1831   RBC 4.82 05/16/2019 1024   RBC 4.42 09/27/2018 1249   HGB 14.3 05/16/2019 1024   HCT 42.0 05/16/2019 1024    PLT 181 05/16/2019 1024   MCV 87 05/16/2019 1024   MCV 81 11/04/2014 1728   MCH 29.7 05/16/2019 1024   MCH 30.3 09/27/2018 1249   MCHC 34.0 05/16/2019 1024   MCHC 33.1 09/27/2018 1249   RDW 12.5 05/16/2019 1024   RDW 16.5 (H) 11/04/2014 1728   LYMPHSABS 2.4 09/27/2018 1249   LYMPHSABS 1.8 03/09/2018 1004   LYMPHSABS 3.0 11/04/2014 1728   MONOABS 0.6 09/27/2018 1249   MONOABS 0.4 11/04/2014 1728   EOSABS 0.2 09/27/2018 1249   EOSABS 0.2 03/09/2018 1004   EOSABS 0.2 11/04/2014 1728   BASOSABS 0.1 09/27/2018 1249   BASOSABS 0.0 03/09/2018 1004   BASOSABS 0.1 11/04/2014 1728     Assessment DUB, multiple somatic complaints H/O thyroid d/o Requests STD testing by blood Plan Orders Placed This Encounter  Procedures  . [place]  . TSH  . TestT+TestF+SHBG  . Prolactin  . RPR  . HIV Antibody (routine testing w rflx)  . Hepatitis C Antibody   F/u on lab results. May need to see her endocrinologist    Emeterio Reeve 11/28/2019, 4:26 PM

## 2019-11-28 NOTE — Progress Notes (Signed)
Pt is in the office for AUB. Hx of tubal, pt reports that she has been having irregular bleeding since a few months before Oct 2020. Pt states that cycle has previously lasted over a month and then she will get a 1-2 week break and start bleeding again. Pt reports bleeding today and states that cramps have become really bad.

## 2019-11-28 NOTE — Patient Instructions (Signed)
Abnormal Uterine Bleeding °Abnormal uterine bleeding means bleeding more than usual from your uterus. It can include: °· Bleeding between periods. °· Bleeding after sex. °· Bleeding that is heavier than normal. °· Periods that last longer than usual. °· Bleeding after you have stopped having your period (menopause). °There are many problems that may cause this. You should see a doctor for any kind of bleeding that is not normal. Treatment depends on the cause of the bleeding. °Follow these instructions at home: °· Watch your condition for any changes. °· Do not use tampons, douche, or have sex, if your doctor tells you not to. °· Change your pads often. °· Get regular well-woman exams. Make sure they include a pelvic exam and cervical cancer screening. °· Keep all follow-up visits as told by your doctor. This is important. °Contact a doctor if: °· The bleeding lasts more than one week. °· You feel dizzy at times. °· You feel like you are going to throw up (nauseous). °· You throw up. °Get help right away if: °· You pass out. °· You have to change pads every hour. °· You have belly (abdominal) pain. °· You have a fever. °· You get sweaty. °· You get weak. °· You passing large blood clots from your vagina. °Summary °· Abnormal uterine bleeding means bleeding more than usual from your uterus. °· There are many problems that may cause this. You should see a doctor for any kind of bleeding that is not normal. °· Treatment depends on the cause of the bleeding. °This information is not intended to replace advice given to you by your health care provider. Make sure you discuss any questions you have with your health care provider. °Document Revised: 07/07/2016 Document Reviewed: 07/07/2016 °Elsevier Patient Education © 2020 Elsevier Inc. ° °

## 2019-11-30 LAB — FOLLICLE STIMULATING HORMONE: FSH: 3.8 m[IU]/mL

## 2019-11-30 LAB — RPR: RPR Ser Ql: NONREACTIVE

## 2019-11-30 LAB — PROLACTIN: Prolactin: 7.6 ng/mL (ref 4.8–23.3)

## 2019-11-30 LAB — TESTT+TESTF+SHBG
Sex Hormone Binding: 78.7 nmol/L (ref 24.6–122.0)
Testosterone, Free: 0.8 pg/mL (ref 0.0–4.2)
Testosterone, Total, LC/MS: 14.3 ng/dL (ref 10.0–55.0)

## 2019-11-30 LAB — HIV ANTIBODY (ROUTINE TESTING W REFLEX): HIV Screen 4th Generation wRfx: NONREACTIVE

## 2019-11-30 LAB — TSH: TSH: 7.9 u[IU]/mL — ABNORMAL HIGH (ref 0.450–4.500)

## 2019-11-30 LAB — HEPATITIS C ANTIBODY: Hep C Virus Ab: <0.1 s/co ratio (ref 0.0–0.9)

## 2019-12-01 ENCOUNTER — Other Ambulatory Visit: Payer: Self-pay | Admitting: Obstetrics & Gynecology

## 2019-12-01 DIAGNOSIS — N940 Mittelschmerz: Secondary | ICD-10-CM

## 2019-12-12 ENCOUNTER — Ambulatory Visit
Admission: RE | Admit: 2019-12-12 | Discharge: 2019-12-12 | Disposition: A | Discharge status: Home / Self Care | Payer: 59 | Source: Ambulatory Visit | Attending: Obstetrics & Gynecology | Admitting: Obstetrics & Gynecology

## 2019-12-12 ENCOUNTER — Other Ambulatory Visit: Payer: Self-pay

## 2019-12-12 DIAGNOSIS — N940 Mittelschmerz: Secondary | ICD-10-CM

## 2019-12-14 ENCOUNTER — Encounter: Payer: Self-pay | Admitting: Obstetrics & Gynecology

## 2019-12-14 ENCOUNTER — Telehealth (INDEPENDENT_AMBULATORY_CARE_PROVIDER_SITE_OTHER): Payer: 59 | Admitting: Obstetrics & Gynecology

## 2019-12-14 ENCOUNTER — Other Ambulatory Visit: Payer: Self-pay | Admitting: Obstetrics & Gynecology

## 2019-12-14 DIAGNOSIS — N940 Mittelschmerz: Secondary | ICD-10-CM

## 2019-12-14 MED ORDER — NORETHIN ACE-ETH ESTRAD-FE 1-20 MG-MCG(24) PO TABS: 1.0000 | ORAL_TABLET | Freq: Every day | ORAL | 11 refills | Status: DC

## 2019-12-14 MED ORDER — NORGESTIMATE-ETH ESTRADIOL 0.25-35 MG-MCG PO TABS: 1.0000 | ORAL_TABLET | Freq: Every day | ORAL | 11 refills | Status: DC

## 2019-12-14 NOTE — Patient Instructions (Signed)
Mittelschmerz ( pain with ovulation ) Mittelschmerz is a condition that affects women. It is pain in the abdomen that occurs between periods. The pain in this condition:  Affects one side of the abdomen. The side that it affects may change from month to month.  May be mild or severe.  May last from minutes to hours. It does not last longer than 1-2 days.  Can happen with nausea and light vaginal bleeding. Mittelschmerz is caused by the growth and release of an egg from an ovary, and it is a natural part of the ovulation cycle. It often happens about 2 weeks after a woman's period ends. Follow these instructions at home: Pay attention to any changes in your condition. Let your health care provider know about them. Take these actions to help with your pain:  Try soaking in a hot bath.  Try a heating pad to relax the muscles in the abdomen.  Take over-the-counter and prescription medicines only as told by your health care provider.  Keep all follow-up visits as told by your health care provider. This is important. Contact a health care provider if:  You have very bad pain most months.  You have abdominal pain that lasts longer than 24 hours.  Your pain medicine is not helping.  You have a fever.  You have nausea or vomiting that will not go away.  You miss your period.  You have vaginal bleeding between your periods that is heavier than spotting. Summary  Mittelschmerz is a condition that affects women. It is pain in the abdomen that occurs between periods.  Mittelschmerz is caused by the growth and release of an egg from an ovary, and it is a natural part of the ovulation cycle.  The pain in this condition may affect one side of the abdomen, may be mild or severe, or may cause nausea.  To relieve your pain, try soaking in a hot bath, using a heating pad, or taking prescription or over-the-counter medicines.  Contact a health care provider if you have pain most months of  the year, your pain lasts more than 24 hours, your pain medicine is not helping, or you have vaginal bleeding that is heavier than spotting. This information is not intended to replace advice given to you by your health care provider. Make sure you discuss any questions you have with your health care provider. Document Revised: 01/25/2018 Document Reviewed: 01/25/2018 Elsevier Patient Education  [place].

## 2019-12-14 NOTE — Progress Notes (Signed)
TELEHEALTH GYNECOLOGY VISIT ENCOUNTER NOTE  I connected with Eileen [name] on 12/14/19 at  9:00 AM EDT by telephone at home and verified that I am speaking with the correct person using two identifiers.   I discussed the limitations, risks, security and privacy concerns of performing an evaluation and management service by telephone and the availability of in person appointments. I also discussed with the patient that there may be a patient responsible charge related to this service. The patient expressed understanding and agreed to proceed.   History:  Eileen [name] is a 29 y.o. [phone] female being evaluated today for midcycle pelvic pain and DUB. She had a pelvic US 5/18. Bleeding stopped 3 days ago, with irregular bleeding that started in January     Past Medical History:  Diagnosis Date  . Abortion 08/04/2011  . Anemia   . Anxiety   . Autoimmune thyroiditis 09/01/2018   Diagnosed by [facility]  . Cholestasis during pregnancy   . Chronic kidney disease    kidney infection.  . Depression   . Dysuria   . Fibromyalgia   . Fibromyalgia   . Headache(784.0)   . Hypothyroid    history of.  . Mitral valve prolapse   . Neuropathy   . RA (rheumatoid arthritis) ([place])    Past Surgical History:  Procedure Laterality Date  . DILATION AND CURETTAGE OF UTERUS    . LAPAROSCOPY N/A 05/25/2014   Procedure: LAPAROSCOPY DIAGNOSTIC;  Surgeon: Osborne Oman, MD;  Location: [facility];  Service: Gynecology;  Laterality: N/A;  . THERAPEUTIC ABORTION N/A 2012  . TUBAL LIGATION Bilateral 10/22/2016   Procedure: POST PARTUM TUBAL LIGATION;  Surgeon: Mora Bellman, MD;  Location: [facility];  Service: Gynecology;  Laterality: Bilateral;   The following portions of the patient's history were reviewed and updated as appropriate: allergies, current medications, past family history, past medical history, past social history, past surgical history and problem list.   Health Maintenance:  Normal  pap and negative HRHPV on 04/2019.    Review of Systems:  Pertinent items noted in HPI and remainder of comprehensive ROS otherwise negative.  Physical Exam:   General:  Alert, oriented and cooperative.   Mental Status: Normal mood and affect perceived. Normal judgment and thought content.  Physical exam deferred due to nature of the encounter  Labs and Imaging No results found for this or any previous visit (from the past 336 hour(s)). US PELVIC COMPLETE WITH TRANSVAGINAL  Result Date: 12/12/2019 CLINICAL DATA:  Pelvic pain, it is functional uterine EXAM: TRANSABDOMINAL AND TRANSVAGINAL ULTRASOUND OF PELVIS TECHNIQUE: Both transabdominal and transvaginal ultrasound examinations of the pelvis were performed. Transabdominal technique was performed for global imaging of the pelvis including uterus, ovaries, adnexal regions, and pelvic cul-de-sac. It was necessary to proceed with endovaginal exam following the transabdominal exam to visualize the endometrium and bilateral ovaries. COMPARISON:  CT abdomen/pelvis dated 02/26/2017 FINDINGS: Uterus Measurements: 8.1 x 5.0 x 5.4 cm = volume: 113 mL. No fibroids or other mass visualized. Endometrium Thickness: 13 mm.  No focal abnormality visualized. Right ovary Measurements: 2.8 x 1.3 x 1.2 cm = volume: 2.2 mL. Normal appearance/no adnexal mass. Left ovary Measurements: 3.6 x 2.0 x 2.4 cm = volume: 8.9 mL. Normal appearance/no adnexal mass. 2.0 cm simple corpus luteum. Other findings No abnormal free fluid. IMPRESSION: Negative pelvic ultrasound. Left corpus luteum, physiologic. Electronically Signed   By: Julian Hy M.D.   On: 12/12/2019 09:44      Assessment  and Plan:     1. Ovulation pain Cycle control with OCP discussed and accepted - Norethindrone Acetate-Ethinyl Estrad-FE (LOESTRIN 24 FE) 1-20 MG-MCG(24) tablet; Take 1 tablet by mouth daily.  Dispense: 1 Package; Refill: 11       I discussed the assessment and treatment plan with the  patient. The patient was provided an opportunity to ask questions and all were answered. The patient agreed with the plan and demonstrated an understanding of the instructions.   The patient was advised to call back or seek an in-person evaluation/go to the ED if the symptoms worsen or if the condition fails to improve as anticipated. F/U in 4 months to discuss her progress I provided 15 minutes of non-face-to-face time during this encounter.   Emeterio Reeve, MD [facility], [place]

## 2019-12-14 NOTE — Progress Notes (Signed)
Virtual   Pt notes to have stopped Bleeding last days . Pt notes cramping.   Patient states her main concern is finding the root of her irregular vaginal bleeding.

## 2020-04-05 ENCOUNTER — Encounter: Payer: Self-pay | Admitting: Radiology

## 2020-06-14 ENCOUNTER — Ambulatory Visit
Admission: EM | Admit: 2020-06-14 | Discharge: 2020-06-14 | Disposition: A | Discharge status: Home / Self Care | Payer: 59 | Attending: Physician Assistant | Admitting: Physician Assistant

## 2020-06-14 DIAGNOSIS — B001 Herpesviral vesicular dermatitis: Secondary | ICD-10-CM

## 2020-06-14 MED ORDER — VALACYCLOVIR HCL 1 G PO TABS
1000.0000 mg | ORAL_TABLET | Freq: Two times a day (BID) | ORAL | 0 refills | Status: DC
Start: 2020-06-14 — End: 2022-09-03

## 2020-06-14 NOTE — Discharge Instructions (Addendum)
Valtrex as directed. Follow up with PCP for further refills.

## 2020-06-14 NOTE — ED Provider Notes (Signed)
[facility]    CSN: [id number] Arrival date & time: 06/14/20  0843      History   Chief Complaint Chief Complaint  Patient presents with  . cold sore on nose    HPI Eileen [name] is a 29 y.o. female.   29 year old female comes in for cold sore to the right nostril starting yesterday. 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 Usually start valtrex on her own but has ran out. No fevers.      Past Medical History:  Diagnosis Date  . Abortion 08/04/2011  . Anemia   . Anxiety   . Autoimmune thyroiditis 09/01/2018   Diagnosed by [facility]  . Cholestasis during pregnancy   . Chronic kidney disease    kidney infection.  . Depression   . Dysuria   . Fibromyalgia   . Fibromyalgia   . Headache(784.0)   . Hypothyroid    history of.  . Mitral valve prolapse   . Neuropathy   . RA (rheumatoid arthritis) [facility])     Patient Active Problem List   Diagnosis Date Noted  . Ovulation pain 05/16/2019  . Autoimmune thyroiditis 09/01/2018  . Dysfunction of right eustachian tube 08/29/2018  . Iron deficiency anemia 05/18/2018  . Lower urinary tract infectious disease 02/14/2018  . Tachycardia 06/16/2016  . Rheumatoid arthritis ([place]) 04/07/2016  . Tobacco abuse 04/07/2016  . Depression with anxiety 11/01/2013  . Anxiety 08/01/2013  . Goiter, unspecified 06/06/2007    Past Surgical History:  Procedure Laterality Date  . DILATION AND CURETTAGE OF UTERUS    . LAPAROSCOPY N/A 05/25/2014   Procedure: LAPAROSCOPY DIAGNOSTIC;  Surgeon: Osborne Oman, MD;  Location: [facility];  Service: Gynecology;  Laterality: N/A;  . THERAPEUTIC ABORTION N/A 2012  . TUBAL LIGATION Bilateral 10/22/2016   Procedure: POST PARTUM TUBAL LIGATION;  Surgeon: Mora Bellman, MD;  Location: [facility];  Service: Gynecology;  Laterality: Bilateral;    OB History    Gravida  6   Para  4   Term  4   Preterm  0   AB  2   Living  4     SAB      TAB  2    Ectopic  0   Multiple  0   Live Births  4            Home Medications    Prior to Admission medications   Medication Sig Start Date End Date Taking? Authorizing Provider  levothyroxine (SYNTHROID) 25 MCG tablet Take 25 mcg by mouth daily before breakfast.    [provider]  valACYclovir (VALTREX) 1000 MG tablet Take 1 tablet (1,000 mg total) by mouth 2 (two) times daily. 06/14/20   Ok Edwards, PA-C    Family History Family History  Problem Relation Age of Onset  . Cancer Mother        SKIN  . Hyperlipidemia Mother   . Thyroid disease Mother   . Heart disease Mother   . Mitral valve prolapse Mother   . Heart attack Mother   . Asthma Brother   . Bipolar disorder Brother   . Diabetes Maternal Grandmother   . Hyperlipidemia Maternal Grandmother   . Emphysema Paternal Grandfather     Social History Social History   Tobacco Use  . Smoking status: Current Every Day Smoker    Packs/day: 1.00    Years: 8.00    Pack years: 8.00  Types: Cigarettes  . Smokeless tobacco: Never Used  Vaping Use  . Vaping Use: Never used  Substance Use Topics  . Alcohol use: Not Currently    Alcohol/week: 0.0 standard drinks    Comment: occasional   . Drug use: No     Allergies   Eggs or egg-derived products, Food, Red dye, and Sulfa antibiotics   Review of Systems Review of Systems  Reason unable to perform ROS: See HPI as above.     Physical Exam Triage Vital Signs ED Triage Vitals [06/14/20 0908]  Enc Vitals Group     BP 95/62     Pulse Rate 99     Resp 18     Temp 98.6 F (37 C)     Temp Source Oral     SpO2 98 %     Weight      Height      Head Circumference      Peak Flow      Pain Score 2     Pain Loc      Pain Edu?      Excl. in [place]?    No data found.  Updated Vital Signs BP 95/62 (BP Location: Left Arm)   Pulse 99   Temp 98.6 F (37 C) (Oral)   Resp 18   LMP 05/26/2020   SpO2 98%   Physical Exam Constitutional:      General: She  is not in acute distress.    Appearance: Normal appearance. She is well-developed. She is not toxic-appearing or diaphoretic.  HENT:     Head: Normocephalic and atraumatic.     Nose:     Comments: Right nostril with erythema to lateral mucosal membrane. Some mucous coverage without obvious ulceration. Bilateral nostril patent Eyes:     Conjunctiva/sclera: Conjunctivae normal.     Pupils: Pupils are equal, round, and reactive to light.  Pulmonary:     Effort: Pulmonary effort is normal. No respiratory distress.  Musculoskeletal:     Cervical back: Normal range of motion and neck supple.  Skin:    General: Skin is warm and dry.  Neurological:     Mental Status: She is alert and oriented to person, place, and time.      UC Treatments / Results  Labs (all labs ordered are listed, but only abnormal results are displayed) Labs Reviewed - No data to display  EKG   Radiology No results found.  Procedures Procedures (including critical care time)  Medications Ordered in UC Medications - No data to display  Initial Impression / Assessment and Plan / UC Course  I have reviewed the triage vital signs and the nursing notes.  Pertinent labs & imaging results that were available during my care of the patient were reviewed by me and considered in my medical decision making (see chart for details).    No obvious ulceration noted. However, patient with cold sore to the area in the past with similar presentation. Will provide valtrex as directed. Return precautions given.  Final Clinical Impressions(s) / UC Diagnoses   Final diagnoses:  Cold sore    ED Prescriptions    Medication Sig Dispense Auth. Provider   valACYclovir (VALTREX) 1000 MG tablet Take 1 tablet (1,000 mg total) by mouth 2 (two) times daily. 10 tablet Ok Edwards, PA-C     PDMP not reviewed this encounter.   Ok Edwards, PA-C 06/14/20 [phone]

## 2020-06-14 NOTE — ED Triage Notes (Signed)
Pt states has a cold sore to nose since yesterday and she is out of her valtrex.

## 2021-03-06 ENCOUNTER — Encounter: Payer: Self-pay | Admitting: Oncology

## 2021-03-13 ENCOUNTER — Encounter: Payer: Self-pay | Admitting: Family Medicine

## 2021-03-13 ENCOUNTER — Other Ambulatory Visit (HOSPITAL_COMMUNITY)
Admission: RE | Admit: 2021-03-13 | Discharge: 2021-03-13 | Disposition: A | Discharge status: Home / Self Care | Payer: BC Managed Care – PPO | Source: Ambulatory Visit | Attending: Family Medicine | Admitting: Family Medicine

## 2021-03-13 ENCOUNTER — Other Ambulatory Visit: Payer: Self-pay

## 2021-03-13 ENCOUNTER — Ambulatory Visit (INDEPENDENT_AMBULATORY_CARE_PROVIDER_SITE_OTHER): Payer: BC Managed Care – PPO | Admitting: Family Medicine

## 2021-03-13 ENCOUNTER — Encounter: Payer: Self-pay | Admitting: Oncology

## 2021-03-13 VITALS — BP 102/70 | HR 60 | Wt 123.8 lb

## 2021-03-13 DIAGNOSIS — R102 Pelvic and perineal pain: Secondary | ICD-10-CM

## 2021-03-13 DIAGNOSIS — E063 Autoimmune thyroiditis: Secondary | ICD-10-CM

## 2021-03-13 DIAGNOSIS — N939 Abnormal uterine and vaginal bleeding, unspecified: Secondary | ICD-10-CM | POA: Diagnosis not present

## 2021-03-13 DIAGNOSIS — Z124 Encounter for screening for malignant neoplasm of cervix: Secondary | ICD-10-CM | POA: Diagnosis not present

## 2021-03-13 DIAGNOSIS — N739 Female pelvic inflammatory disease, unspecified: Secondary | ICD-10-CM | POA: Insufficient documentation

## 2021-03-13 MED ORDER — NORETHINDRONE ACETATE 5 MG PO TABS: 5.0000 mg | ORAL_TABLET | Freq: Every day | ORAL | 2 refills | Status: DC

## 2021-03-13 NOTE — Assessment & Plan Note (Signed)
?   Related to ovarian cyst--might need f/u.

## 2021-03-13 NOTE — Progress Notes (Addendum)
Subjective:    [name] is a 30 y.o. female presenting with Gynecologic Exam  on 03/13/2021  HPI: Notes knot on cervix. Has bleeding after sex. Cycles are normal and heavier since her tubes were tied. Having cramping all the time. Has pain, and went to [facility] and noted to have ovarian cyst on right. Has previously been on OCs and Depo in the past and has not been helpful. Ha autoimmune thyroiditis and last TSH was normal per patient in 08/2020. Last TSH here was 5/21 and was abnormal. Not on any thyroid meds right now.  Review of Systems  Constitutional:  Negative for chills and fever.  HENT:  Negative for congestion, nosebleeds and rhinorrhea.   Eyes:  Negative for visual disturbance.  Respiratory:  Negative for chest tightness and shortness of breath.   Cardiovascular:  Negative for chest pain.  Gastrointestinal:  Negative for abdominal distention, abdominal pain, blood in stool, constipation, diarrhea, nausea and vomiting.  Genitourinary:  Negative for dysuria, frequency, menstrual problem and vaginal bleeding.  Musculoskeletal:  Negative for arthralgias.  Skin:  Negative for rash.  Neurological:  Negative for dizziness and headaches.  Psychiatric/Behavioral:  Negative for behavioral problems, dysphoric mood and sleep disturbance.   All other systems reviewed and are negative.    Objective:    BP 102/70   Pulse 60   Wt 123 lb 12.8 oz (56.2 kg)   LMP 03/06/2021   BMI 25.87 kg/m  Physical Exam Exam conducted with a chaperone present.  Constitutional:      Appearance: She is well-developed.  HENT:     Head: Normocephalic and atraumatic.  Eyes:     General: No scleral icterus.    Pupils: Pupils are equal, round, and reactive to light.  Neck:     Thyroid: No thyromegaly.  Cardiovascular:     Rate and Rhythm: Normal rate and regular rhythm.     Heart sounds: No murmur heard. Pulmonary:     Effort: Pulmonary effort is normal.     Breath  sounds: Normal breath sounds.  Chest:  Breasts:    Right: No inverted nipple, mass or skin change.     Left: No inverted nipple, mass or skin change.  Abdominal:     General: There is no distension.     Palpations: Abdomen is soft.     Tenderness: There is no abdominal tenderness.  Genitourinary:    Comments: BUS normal, vagina is pink and rugated, cervix is nulliparous without lesion, uterus is small and anteverted, no adnexal mass or tenderness.  Musculoskeletal:     Cervical back: Normal range of motion and neck supple. No tenderness.  Lymphadenopathy:     Cervical: No cervical adenopathy.  Skin:    General: Skin is warm and dry.  Neurological:     Mental Status: She is alert and oriented to person, place, and time.        Assessment & Plan:   Problem List Items Addressed This Visit       Unprioritized   Autoimmune thyroiditis    Consider re-checking levels      Pelvic pain    ? Related to ovarian cyst--might need f/u.      Relevant Medications   norethindrone (AYGESTIN) 5 MG tablet   Abnormal uterine bleeding    Getting married, and may want to have tubes untied. Will give trial of continuous progestin. Does not want IUD. Has not had success with Depo or OC's.  Relevant Medications   norethindrone (AYGESTIN) 5 MG tablet   Other Visit Diagnoses     Screening for cervical cancer    -  Primary   Relevant Orders   Cytology - PAP( [place])      Return in about 3 months (around 06/13/2021) for virtual.  Donnamae Jude 03/13/2021 10:48 AM

## 2021-03-13 NOTE — Assessment & Plan Note (Signed)
Consider re-checking levels

## 2021-03-13 NOTE — Assessment & Plan Note (Signed)
Getting married, and may want to have tubes untied. Will give trial of continuous progestin. Does not want IUD. Has not had success with Depo or OC's.

## 2021-03-13 NOTE — Progress Notes (Signed)
Pt was seen at [facility] for AUB, cramping, and postcoital bleeding on August 8th. No relief with BCP in the past.  Ultrasound indicated she has a cyst on R ovary per pt.  Last pap 05/16/2019 - negative with HR HPV

## 2021-03-19 LAB — CYTOLOGY - PAP
Comment: NEGATIVE
Diagnosis: NEGATIVE
Diagnosis: REACTIVE
High risk HPV: NEGATIVE

## 2021-04-15 DIAGNOSIS — Z20822 Contact with and (suspected) exposure to covid-19: Secondary | ICD-10-CM | POA: Diagnosis not present

## 2021-04-15 DIAGNOSIS — J209 Acute bronchitis, unspecified: Secondary | ICD-10-CM | POA: Diagnosis not present

## 2021-05-09 ENCOUNTER — Telehealth: Payer: Self-pay | Admitting: Radiology

## 2021-05-09 ENCOUNTER — Encounter: Payer: Self-pay | Admitting: Radiology

## 2021-05-09 NOTE — Telephone Encounter (Signed)
Called patient to schedule virtual follow up with Dr Kennon Rounds in November. Patient stated that she never took the medication that was prescribed and the follow up appointment would not be necessary.

## 2021-08-27 ENCOUNTER — Encounter: Payer: Self-pay | Admitting: Oncology

## 2021-09-01 DIAGNOSIS — I959 Hypotension, unspecified: Secondary | ICD-10-CM | POA: Diagnosis not present

## 2021-09-01 DIAGNOSIS — R112 Nausea with vomiting, unspecified: Secondary | ICD-10-CM | POA: Diagnosis not present

## 2021-09-01 DIAGNOSIS — R197 Diarrhea, unspecified: Secondary | ICD-10-CM | POA: Diagnosis not present

## 2021-09-01 DIAGNOSIS — R079 Chest pain, unspecified: Secondary | ICD-10-CM | POA: Diagnosis not present

## 2021-12-25 ENCOUNTER — Ambulatory Visit (INDEPENDENT_AMBULATORY_CARE_PROVIDER_SITE_OTHER): Payer: BC Managed Care – PPO

## 2021-12-25 ENCOUNTER — Encounter: Payer: Self-pay | Admitting: Oncology

## 2021-12-25 ENCOUNTER — Ambulatory Visit
Admission: EM | Admit: 2021-12-25 | Discharge: 2021-12-25 | Disposition: A | Discharge status: Home / Self Care | Payer: BC Managed Care – PPO | Attending: Family Medicine | Admitting: Family Medicine

## 2021-12-25 DIAGNOSIS — R042 Hemoptysis: Secondary | ICD-10-CM | POA: Diagnosis not present

## 2021-12-25 DIAGNOSIS — J4521 Mild intermittent asthma with (acute) exacerbation: Secondary | ICD-10-CM | POA: Diagnosis not present

## 2021-12-25 DIAGNOSIS — R059 Cough, unspecified: Secondary | ICD-10-CM | POA: Diagnosis not present

## 2021-12-25 MED ORDER — PREDNISONE 20 MG PO TABS: 40.0000 mg | ORAL_TABLET | Freq: Every day | ORAL | 0 refills | Status: AC

## 2021-12-25 MED ORDER — ALBUTEROL SULFATE HFA 108 (90 BASE) MCG/ACT IN AERS: 2.0000 | INHALATION_SPRAY | RESPIRATORY_TRACT | 0 refills | Status: AC | PRN

## 2021-12-25 NOTE — ED Provider Notes (Signed)
[facility]    CSN: [id number] Arrival date & time: 12/25/21  1750      History   Chief Complaint Chief Complaint  Patient presents with   Cough    HPI Eileen [name] is a 31 y.o. female.    Cough Here for hemoptysis that happened today.  She has been coughing since she first noticed it.  She states that it was a glob of blood and there was no mucus in it.  2 nights ago she noted some tingling in her throat.  Since then she has maybe had some nasal congestion, but has not had any cough until today.  She states her chest now feels heavy and like when she has had bronchitis.  She has used before  Past Medical History:  Diagnosis Date   Abortion 08/04/2011   Anemia    Anxiety    Autoimmune thyroiditis 09/01/2018   Diagnosed by [facility]   Cholestasis during pregnancy    Chronic kidney disease    kidney infection.   Depression    Dysuria    Fibromyalgia    Fibromyalgia    Headache(784.0)    Hypothyroid    history of.   Mitral valve prolapse    Neuropathy    RA (rheumatoid arthritis) [facility])     Patient Active Problem List   Diagnosis Date Noted   Pelvic inflammatory disease 03/13/2021   Pelvic pain 03/13/2021   Abnormal uterine bleeding 03/13/2021   Ovulation pain 05/16/2019   Autoimmune thyroiditis 09/01/2018   Dysfunction of right eustachian tube 08/29/2018   Iron deficiency anemia 05/18/2018   Lower urinary tract infectious disease 02/14/2018   Tachycardia 06/16/2016   Rheumatoid arthritis ([place]) 04/07/2016   Tobacco abuse 04/07/2016   Depression with anxiety 11/01/2013   Anxiety 08/01/2013   Goiter, unspecified 06/06/2007    Past Surgical History:  Procedure Laterality Date   DILATION AND CURETTAGE OF UTERUS     LAPAROSCOPY N/A 05/25/2014   Procedure: LAPAROSCOPY DIAGNOSTIC;  Surgeon: Osborne Oman, MD;  Location: [facility];  Service: Gynecology;  Laterality: N/A;   THERAPEUTIC ABORTION N/A 2012   TUBAL LIGATION Bilateral  10/22/2016   Procedure: POST PARTUM TUBAL LIGATION;  Surgeon: Mora Bellman, MD;  Location: [facility];  Service: Gynecology;  Laterality: Bilateral;    OB History     Gravida  6   Para  4   Term  4   Preterm  0   AB  2   Living  4      SAB      IAB  2   Ectopic  0   Multiple  0   Live Births  4            Home Medications    Prior to Admission medications   Medication Sig Start Date End Date Taking? Authorizing Provider  albuterol (VENTOLIN HFA) 108 (90 Base) MCG/ACT inhaler Inhale 2 puffs into the lungs every 4 (four) hours as needed for wheezing or shortness of breath. 12/25/21  Yes Analaya Hoey, Gwenlyn Perking, MD  predniSONE (DELTASONE) 20 MG tablet Take 2 tablets (40 mg total) by mouth daily with breakfast for 5 days. 12/25/21 12/30/21 Yes Barrett Henle, MD  levothyroxine (SYNTHROID) 25 MCG tablet Take 25 mcg by mouth daily before breakfast. Patient not taking: Reported on 03/13/2021    [provider]  norethindrone (AYGESTIN) 5 MG tablet Take 1 tablet (5 mg total) by mouth daily. 03/13/21   Donnamae Jude,  MD  valACYclovir (VALTREX) 1000 MG tablet Take 1 tablet (1,000 mg total) by mouth 2 (two) times daily. Patient not taking: Reported on 03/13/2021 06/14/20   Arturo Morton    Family History Family History  Problem Relation Age of Onset   Cancer Mother        SKIN   Hyperlipidemia Mother    Thyroid disease Mother    Heart disease Mother    Mitral valve prolapse Mother    Heart attack Mother    Asthma Brother    Bipolar disorder Brother    Diabetes Maternal Grandmother    Hyperlipidemia Maternal Grandmother    Emphysema Paternal Grandfather     Social History Social History   Tobacco Use   Smoking status: Every Day    Packs/day: 1.00    Years: 8.00    Pack years: 8.00    Types: Cigarettes   Smokeless tobacco: Never  Vaping Use   Vaping Use: Never used  Substance Use Topics   Alcohol use: Not Currently    Alcohol/week: 0.0 standard drinks     Comment: occasional    Drug use: No     Allergies   Eggs or egg-derived products, Food, Red dye, and Sulfa antibiotics   Review of Systems Review of Systems  Respiratory:  Positive for cough.     Physical Exam Triage Vital Signs ED Triage Vitals  Enc Vitals Group     BP 12/25/21 1902 113/77     Pulse Rate 12/25/21 1902 99     Resp 12/25/21 1902 18     Temp 12/25/21 1902 98.4 F (36.9 C)     Temp Source 12/25/21 1902 Oral     SpO2 12/25/21 1902 100 %     Weight --      Height --      Head Circumference --      Peak Flow --      Pain Score 12/25/21 1901 5     Pain Loc --      Pain Edu? --      Excl. in [place]? --    No data found.  Updated Vital Signs BP 113/77   Pulse 99   Temp 98.4 F (36.9 C) (Oral)   Resp 18   LMP 12/17/2021 (Approximate)   SpO2 100%   Visual Acuity Right Eye Distance:   Left Eye Distance:   Bilateral Distance:    Right Eye Near:   Left Eye Near:    Bilateral Near:     Physical Exam Vitals reviewed.  Constitutional:      General: She is not in acute distress.    Appearance: She is not toxic-appearing.  HENT:     Right Ear: Tympanic membrane and ear canal normal.     Left Ear: Tympanic membrane and ear canal normal.     Nose: Nose normal.     Mouth/Throat:     Mouth: Mucous membranes are moist.     Pharynx: No oropharyngeal exudate or posterior oropharyngeal erythema.  Eyes:     Extraocular Movements: Extraocular movements intact.     Conjunctiva/sclera: Conjunctivae normal.     Pupils: Pupils are equal, round, and reactive to light.  Cardiovascular:     Rate and Rhythm: Normal rate and regular rhythm.     Heart sounds: No murmur heard. Pulmonary:     Effort: Pulmonary effort is normal. No respiratory distress.     Breath sounds: No stridor. No wheezing, rhonchi or rales.  Chest:     Chest wall: No tenderness.  Musculoskeletal:     Cervical back: Neck supple.  Lymphadenopathy:     Cervical: No cervical adenopathy.   Skin:    Capillary Refill: Capillary refill takes less than 2 seconds.     Coloration: Skin is not jaundiced or pale.  Neurological:     General: No focal deficit present.     Mental Status: She is alert and oriented to person, place, and time.  Psychiatric:        Behavior: Behavior normal.     UC Treatments / Results  Labs (all labs ordered are listed, but only abnormal results are displayed) Labs Reviewed - No data to display  EKG   Radiology DG Chest 2 View  Result Date: 12/25/2021 CLINICAL DATA:  Hemoptysis, cough for 4 days EXAM: CHEST - 2 VIEW COMPARISON:  06/15/2016 FINDINGS: The heart size and mediastinal contours are within normal limits. Both lungs are clear. The visualized skeletal structures are unremarkable. IMPRESSION: No active cardiopulmonary disease. Electronically Signed   By: Randa Ngo M.D.   On: 12/25/2021 19:38    Procedures Procedures (including critical care time)  Medications Ordered in UC Medications - No data to display  Initial Impression / Assessment and Plan / UC Course  I have reviewed the triage vital signs and the nursing notes.  Pertinent labs & imaging results that were available during my care of the patient were reviewed by me and considered in my medical decision making (see chart for details).     Chest x-ray is clear.  Since she is feeling tight in her chest and is probably having an asthma exacerbation, will treat with prednisone and an inhaler.She has done a home covid test that was neg Final Clinical Impressions(s) / UC Diagnoses   Final diagnoses:  Hemoptysis  Mild intermittent asthma with acute exacerbation     Discharge Instructions      Your chest x-ray is clear  Albuterol inhaler--do 2 puffs every 4 hours as needed for shortness of breath or wheezing  Prednisone 20 mg--2 daily for 5 days  Follow-up with your primary care in the next week or 2       ED Prescriptions     Medication Sig Dispense Auth.  Provider   albuterol (VENTOLIN HFA) 108 (90 Base) MCG/ACT inhaler Inhale 2 puffs into the lungs every 4 (four) hours as needed for wheezing or shortness of breath. 1 each Barrett Henle, MD   predniSONE (DELTASONE) 20 MG tablet Take 2 tablets (40 mg total) by mouth daily with breakfast for 5 days. 10 tablet Windy Carina Gwenlyn Perking, MD      PDMP not reviewed this encounter.   Barrett Henle, MD 12/25/21 [phone]

## 2021-12-25 NOTE — Discharge Instructions (Addendum)
Your chest x-ray is clear  Albuterol inhaler--do 2 puffs every 4 hours as needed for shortness of breath or wheezing  Prednisone 20 mg--2 daily for 5 days  Follow-up with your primary care in the next week or 2

## 2021-12-25 NOTE — ED Triage Notes (Signed)
Patient presents to Urgent Care with complaints of cough and blood tinged sputum since monday.

## 2022-04-20 ENCOUNTER — Encounter: Payer: Self-pay | Admitting: Oncology

## 2022-04-29 ENCOUNTER — Encounter: Payer: Self-pay | Admitting: Oncology

## 2022-05-25 DIAGNOSIS — H6091 Unspecified otitis externa, right ear: Secondary | ICD-10-CM | POA: Diagnosis not present

## 2022-05-25 DIAGNOSIS — H6691 Otitis media, unspecified, right ear: Secondary | ICD-10-CM | POA: Diagnosis not present

## 2022-05-25 DIAGNOSIS — R051 Acute cough: Secondary | ICD-10-CM | POA: Diagnosis not present

## 2022-05-25 DIAGNOSIS — J019 Acute sinusitis, unspecified: Secondary | ICD-10-CM | POA: Diagnosis not present

## 2022-08-27 ENCOUNTER — Encounter: Payer: Self-pay | Admitting: Family Medicine

## 2022-08-27 ENCOUNTER — Other Ambulatory Visit (HOSPITAL_COMMUNITY)
Admission: RE | Admit: 2022-08-27 | Discharge: 2022-08-27 | Disposition: A | Discharge status: Home / Self Care | Payer: BC Managed Care – PPO | Source: Ambulatory Visit | Attending: Family Medicine | Admitting: Family Medicine

## 2022-08-27 ENCOUNTER — Ambulatory Visit: Payer: BC Managed Care – PPO | Admitting: Family Medicine

## 2022-08-27 VITALS — BP 111/73 | HR 107 | Ht 59.0 in | Wt 128.0 lb

## 2022-08-27 DIAGNOSIS — Z862 Personal history of diseases of the blood and blood-forming organs and certain disorders involving the immune mechanism: Secondary | ICD-10-CM | POA: Diagnosis not present

## 2022-08-27 DIAGNOSIS — N939 Abnormal uterine and vaginal bleeding, unspecified: Secondary | ICD-10-CM | POA: Diagnosis not present

## 2022-08-27 DIAGNOSIS — Z9851 Tubal ligation status: Secondary | ICD-10-CM

## 2022-08-27 DIAGNOSIS — E063 Autoimmune thyroiditis: Secondary | ICD-10-CM | POA: Diagnosis not present

## 2022-08-27 LAB — POCT URINE PREGNANCY: Preg Test, Ur: NEGATIVE

## 2022-08-27 NOTE — Progress Notes (Signed)
Pt is in the office reporting AUB. She states that has had regular cycles up until the last month. Pt states last normal cycle was 08/03/22 and then she started bleeding on 08/16/22 heavy with clots x1 week. Pt states that she took 3 home UPTs with a faint positive. Pt haa hx of BTL in 2018. Last pap 03/13/2021

## 2022-08-27 NOTE — Progress Notes (Signed)
GYNECOLOGY OFFICE VISIT NOTE  History:   Eileen [name] is a 32 y.o. [id number] here today for irregular menstrual bleeding. She typically has regular periods, however seemed to have 2 different periods in the month of January, within 1 week of each other, the 2nd period lasted about 7 days and consisted of heavy bleeding. She reports checking home pregnancy test 3 times at home, and one of them appeared faintly positive. She had a BTL in 2018.   She feels slightly tired. No shortness or breath or dizziness. She has a history of hypothyroidism, and was prescribed levothyroxine at some point, however reports that her TSH levels were fluctuating a lot and she hasn't been taking the medication. This was managed by her PCP.  She denies any abnormal vaginal discharge or pelvic pain, not exposed to STIs. No known history of fibroids.    Past Medical History:  Diagnosis Date   Abortion 08/04/2011   Anemia    Anxiety    Autoimmune thyroiditis 09/01/2018   Diagnosed by [facility]   Cholestasis during pregnancy    Chronic kidney disease    kidney infection.   Depression    Dysuria    Fibromyalgia    Fibromyalgia    Headache(784.0)    Hypothyroid    history of.   Mitral valve prolapse    Neuropathy    RA (rheumatoid arthritis) ([place])     Past Surgical History:  Procedure Laterality Date   DILATION AND CURETTAGE OF UTERUS     LAPAROSCOPY N/A 05/25/2014   Procedure: LAPAROSCOPY DIAGNOSTIC;  Surgeon: Osborne Oman, MD;  Location: [facility];  Service: Gynecology;  Laterality: N/A;   THERAPEUTIC ABORTION N/A 2012   TUBAL LIGATION Bilateral 10/22/2016   Procedure: POST PARTUM TUBAL LIGATION;  Surgeon: Mora Bellman, MD;  Location: [facility];  Service: Gynecology;  Laterality: Bilateral;    The following portions of the patient's history were reviewed and updated as appropriate: allergies, current medications, past family history, past medical history, past social history, past surgical  history and problem list.   Health Maintenance:  Normal pap and negative HRHPV in 02/2021.  Mammogram not due yet.   Review of Systems:  Pertinent items noted in HPI and remainder of comprehensive ROS otherwise negative.  Physical Exam:  BP 111/73   Pulse (!) 107   Ht 4' 11"$  (1.499 m)   Wt 128 lb (58.1 kg)   LMP 08/03/2022 Comment: BTL  BMI 25.85 kg/m   CONSTITUTIONAL: Well-developed, well-nourished female in no acute distress.  HEENT:  Normocephalic, atraumatic. External right and left ear normal. No scleral icterus.  PSYCHIATRIC: Normal mood and affect. Normal behavior. Normal judgment and thought content. CARDIOVASCULAR: Normal heart rate noted RESPIRATORY: Effort and breath sounds normal, no problems with respiration noted ABDOMEN: No masses noted. No other overt distention noted.   PELVIC: Normal appearing external genitalia; normal urethral meatus; normal appearing vaginal mucosa and cervix.  No abnormal discharge noted.  Normal uterine size, no other palpable masses, no uterine or adnexal tenderness. Performed in the presence of a chaperone  Labs and Imaging No results found for this or any previous visit (from the past 168 hour(s)). No results found.     Assessment and Plan:  Abnormal uterine bleeding  Etiology unclear. Only one episode, Ddx, include thyroid dysfunction, vaginitis, undiagnosed fibroid, polyps or other uterine defects. Plan to get labs to further evaluate. If all negative, consider a pelvic US Given one episode of this, can track cycles  for the next couple months and watch for recurrence Follow up pending lab results. - Plan: Beta hCG quant (ref lab), CBC, TSH Rfx on Abnormal to Free T4, Cervicovaginal ancillary only( [place]), POCT urine pregnancy  History of anemia  - Plan: CBC  Hashimoto's thyroiditis  - Plan: TSH Rfx on Abnormal to Free T4  Status post tubal ligation   Had one weakly positive UPT at home. Will check bHCG to confirm.  I spent   35  minutes dedicated to the care of this patient including pre-visit review of records, face to face time with the patient discussing her conditions and treatments and post visit orders.   Liliane Channel MD MPH OB Fellow, [place] [facility] 09/04/2022

## 2022-08-28 LAB — CBC
Hematocrit: 34.3 % (ref 34.0–46.6)
Hemoglobin: 10.5 g/dL — ABNORMAL LOW (ref 11.1–15.9)
MCH: 21.9 pg — ABNORMAL LOW (ref 26.6–33.0)
MCHC: 30.6 g/dL — ABNORMAL LOW (ref 31.5–35.7)
MCV: 72 fL — ABNORMAL LOW (ref 79–97)
Platelets: 274 10*3/uL (ref 150–450)
RBC: 4.8 x10E6/uL (ref 3.77–5.28)
RDW: 17 % — ABNORMAL HIGH (ref 11.7–15.4)
WBC: 7.6 10*3/uL (ref 3.4–10.8)

## 2022-08-28 LAB — TSH RFX ON ABNORMAL TO FREE T4: TSH: 9.77 u[IU]/mL — ABNORMAL HIGH (ref 0.450–4.500)

## 2022-08-28 LAB — BETA HCG QUANT (REF LAB): hCG Quant: <1 m[IU]/mL

## 2022-08-28 LAB — T4F: T4,Free (Direct): 1.06 ng/dL (ref 0.82–1.77)

## 2022-08-31 LAB — CERVICOVAGINAL ANCILLARY ONLY
Bacterial Vaginitis (gardnerella): POSITIVE — AB
Candida Glabrata: NEGATIVE
Candida Vaginitis: NEGATIVE
Chlamydia: NEGATIVE
Comment: NEGATIVE
Comment: NEGATIVE
Comment: NEGATIVE
Comment: NEGATIVE
Comment: NEGATIVE
Comment: NORMAL
Neisseria Gonorrhea: NEGATIVE
Trichomonas: NEGATIVE

## 2022-09-03 ENCOUNTER — Other Ambulatory Visit: Payer: Self-pay | Admitting: Family Medicine

## 2022-09-03 DIAGNOSIS — E039 Hypothyroidism, unspecified: Secondary | ICD-10-CM

## 2022-09-03 DIAGNOSIS — B9689 Other specified bacterial agents as the cause of diseases classified elsewhere: Secondary | ICD-10-CM

## 2022-09-03 MED ORDER — METRONIDAZOLE 500 MG PO TABS: 500.0000 mg | ORAL_TABLET | Freq: Two times a day (BID) | ORAL | 0 refills | Status: AC

## 2022-09-03 MED ORDER — LEVOTHYROXINE SODIUM 88 MCG PO TABS: 88.0000 ug | ORAL_TABLET | Freq: Every day | ORAL | 11 refills | Status: DC

## 2022-09-03 NOTE — Progress Notes (Signed)
Starting dose of levothyroxine: 1.65mg/kg/day. ~ 92.89m. starting at 88 mcg I have recommended follow up with PCP for monitoring and continuing management. Metronidazole sent for BV. Patient informed via myOsa CraverD MPH OB Fellow, [facility] [place]/02/2023

## 2022-10-02 DIAGNOSIS — E034 Atrophy of thyroid (acquired): Secondary | ICD-10-CM | POA: Diagnosis not present

## 2022-10-02 DIAGNOSIS — E049 Nontoxic goiter, unspecified: Secondary | ICD-10-CM | POA: Diagnosis not present

## 2022-10-02 DIAGNOSIS — D649 Anemia, unspecified: Secondary | ICD-10-CM | POA: Diagnosis not present

## 2022-10-02 DIAGNOSIS — F3181 Bipolar II disorder: Secondary | ICD-10-CM | POA: Diagnosis not present

## 2022-10-07 DIAGNOSIS — E041 Nontoxic single thyroid nodule: Secondary | ICD-10-CM | POA: Diagnosis not present

## 2022-10-07 DIAGNOSIS — E034 Atrophy of thyroid (acquired): Secondary | ICD-10-CM | POA: Diagnosis not present

## 2022-10-19 DIAGNOSIS — H66001 Acute suppurative otitis media without spontaneous rupture of ear drum, right ear: Secondary | ICD-10-CM | POA: Diagnosis not present

## 2022-10-20 DIAGNOSIS — H66001 Acute suppurative otitis media without spontaneous rupture of ear drum, right ear: Secondary | ICD-10-CM | POA: Diagnosis not present

## 2022-11-03 DIAGNOSIS — F3181 Bipolar II disorder: Secondary | ICD-10-CM | POA: Diagnosis not present

## 2022-11-03 DIAGNOSIS — D649 Anemia, unspecified: Secondary | ICD-10-CM | POA: Diagnosis not present

## 2022-11-03 DIAGNOSIS — E034 Atrophy of thyroid (acquired): Secondary | ICD-10-CM | POA: Diagnosis not present

## 2022-11-03 DIAGNOSIS — M069 Rheumatoid arthritis, unspecified: Secondary | ICD-10-CM | POA: Diagnosis not present

## 2024-01-25 DIAGNOSIS — F4310 Post-traumatic stress disorder, unspecified: Secondary | ICD-10-CM

## 2024-01-25 DIAGNOSIS — F411 Generalized anxiety disorder: Secondary | ICD-10-CM

## 2024-01-25 DIAGNOSIS — F329 Major depressive disorder, single episode, unspecified: Secondary | ICD-10-CM

## 2024-01-25 HISTORY — DX: Major depressive disorder, single episode, unspecified: F32.9

## 2024-01-25 HISTORY — DX: Generalized anxiety disorder: F41.1

## 2024-01-25 HISTORY — DX: Post-traumatic stress disorder, unspecified: F43.10

## 2024-02-08 NOTE — Progress Notes (Signed)
 Office Visit Note  Patient: Eileen [name]             Date of Birth: 1990-09-27           MRN: [id number]             PCP: Patient, No Pcp Per Referring: Vicci Odor, PA Visit Date: 02/09/2024   Subjective:  New Patient (Initial Visit) (Positive ANA , Dx with RA at 26 previous Dr looked at her an told her she didn't look like she has RA, patient never returned to Dr. Leroy like more info )    Discussed the use of AI scribe software for clinical note transcription with the patient, who gave verbal consent to proceed.  History of Present Illness   Kemiah  Craghead is a 33 year old female who presents with joint pain and stiffness for evaluation of abnormal lab test results and movement issues.  She has experienced joint pain, stiffness, and movement issues for an extended period. Abnormal lab test results, including elevated sedimentation rate and a positive ANA test, have been present since 2019. Despite these findings, she has not been on specific treatments for rheumatoid arthritis as her rheumatoid factor has been negative. She manages her joint and muscle pain with Advil  and Tylenol , taking up to 800 mg of Advil  twice daily.  She has a history of Hashimoto's disease and was previously on Cymbalta  for fibromyalgia and depression but discontinued it after a month due to adverse effects.  Significant pain in her legs, particularly from the hips down, affects her ability to walk long distances. She reports that her ankles and knees swell, and that they are more painful when swollen. She experiences morning stiffness, describing it as feeling like she 'ran a mile in her sleep', with throbbing pain in her feet upon standing.  She experiences heat rashes in areas where skin rubs together, such as her thighs and under her breasts, leading to scarring from itching. She occasionally experiences mouth ulcers, which resolve quickly with salt application.During her pregnancies, she experienced  cholestasis with two of her children and was diagnosed with mitral valve prolapse during her first pregnancy. She experiences palpitations and occasional chest pain, which has been evaluated in the past without evidence of a heart attack.  She reports numbness in her feet, particularly when sitting, which resolves upon movement. A past nerve conduction study was performed, but she did not feel the shocks during the test.  Her family history includes some autoimmune conditions on her mother's side, though specifics are unknown. Her father avoids medical consultations.       Activities of Daily Living:  Patient reports morning stiffness for 1-2 hours.   Patient Reports nocturnal pain.  Difficulty dressing/grooming: Denies Difficulty climbing stairs: Reports Difficulty getting out of chair: Denies Difficulty using hands for taps, buttons, cutlery, and/or writing: Reports  Review of Systems  Constitutional:  Positive for fatigue.  HENT:  Positive for mouth dryness. Negative for mouth sores.   Eyes:  Positive for dryness.  Respiratory:  Negative for shortness of breath.   Cardiovascular:  Positive for chest pain and palpitations.  Gastrointestinal:  Negative for blood in stool, constipation and diarrhea.  Endocrine: Negative for increased urination.  Genitourinary:  Negative for involuntary urination.  Musculoskeletal:  Positive for joint pain, gait problem, joint pain, joint swelling, myalgias, muscle weakness, morning stiffness, muscle tenderness and myalgias.  Skin:  Positive for color change, rash and hair loss. Negative for sensitivity to sunlight.  Allergic/Immunologic:  Positive for susceptible to infections.  Neurological:  Positive for headaches. Negative for dizziness.  Hematological:  Negative for swollen glands.  Psychiatric/Behavioral:  Positive for depressed mood and sleep disturbance. The patient is nervous/anxious.     PMFS History:  Patient Active Problem List    Diagnosis Date Noted   Bilateral foot pain 02/09/2024   Bilateral hand pain 02/09/2024   Pelvic inflammatory disease 03/13/2021   Pelvic pain 03/13/2021   Abnormal uterine bleeding 03/13/2021   Ovulation pain 05/16/2019   Autoimmune thyroiditis 09/01/2018   Dysfunction of right eustachian tube 08/29/2018   Iron  deficiency anemia 05/18/2018   Lower urinary tract infectious disease 02/14/2018   Tachycardia 06/16/2016   Abnormal laboratory test result 04/07/2016   Tobacco abuse 04/07/2016   Depression with anxiety 11/01/2013   Anxiety 08/01/2013   Goiter 06/06/2007    Past Medical History:  Diagnosis Date   Abortion 08/04/2011   Anemia    Anxiety    Autoimmune thyroiditis 09/01/2018   Diagnosed by [facility]   Cholestasis during pregnancy    Chronic kidney disease    kidney infection.   Depression    Dysuria    Fibromyalgia    Fibromyalgia    Hashimoto's disease 2006   Headache(784.0)    Hypothyroid    history of.   Mitral valve prolapse    Neuropathy    RA (rheumatoid arthritis) (HCC)     Family History  Problem Relation Age of Onset   Cancer Mother        SKIN   Hyperlipidemia Mother    Thyroid  disease Mother    Heart disease Mother    Mitral valve prolapse Mother    Heart attack Mother    COPD Mother    Asthma Brother    Bipolar disorder Brother    Diabetes Maternal Grandmother    Hyperlipidemia Maternal Grandmother    Emphysema Paternal Grandfather    Asthma Daughter    Healthy Daughter    Healthy Daughter    Healthy Son    Past Surgical History:  Procedure Laterality Date   DILATION AND CURETTAGE OF UTERUS     LAPAROSCOPY N/A 05/25/2014   Procedure: LAPAROSCOPY DIAGNOSTIC;  Surgeon: Gloris DELENA Hugger, MD;  Location: [facility];  Service: Gynecology;  Laterality: N/A;   THERAPEUTIC ABORTION N/A 2012   TUBAL LIGATION Bilateral 10/22/2016   Procedure: POST PARTUM TUBAL LIGATION;  Surgeon: Winton Felt, MD;  Location: [facility];  Service:  Gynecology;  Laterality: Bilateral;   Social History   Social History Narrative   Not on file   Immunization History  Administered Date(s) Administered   HPV Quadrivalent 06/06/2007, 08/16/2007, 11/16/2007   Tdap 03/30/2013, 08/25/2016     Objective: Vital Signs: BP 103/71 (BP Location: Right Arm, Patient Position: Sitting, Cuff Size: Normal)   Pulse 83   Resp 16   Ht 5' 0.5 (1.537 m)   Wt 139 lb (63 kg)   LMP 02/01/2024   BMI 26.70 kg/m    Physical Exam Eyes:     Conjunctiva/sclera: Conjunctivae normal.  Cardiovascular:     Rate and Rhythm: Normal rate and regular rhythm.  Pulmonary:     Effort: Pulmonary effort is normal.     Breath sounds: Normal breath sounds.  Lymphadenopathy:     Cervical: No cervical adenopathy.  Skin:    General: Skin is warm and dry.     Findings: No rash.  Neurological:     Mental Status: She is alert.  Psychiatric:  Mood and Affect: Mood normal.      Musculoskeletal Exam:  Shoulders full ROM no tenderness or swelling Elbows full ROM no tenderness or swelling Wrists full ROM no tenderness or swelling Fingers hyperextensibility extension > 90 degrees, no swelling Knees full ROM no tenderness or swelling Ankles full ROM no tenderness or swelling, right achilles tendon insertion nodule Tenderness to pressure on plantar side at MTPs and proximally  Investigation: No additional findings.  Imaging: No results found.  Recent Labs: Lab Results  Component Value Date   WBC 7.6 08/27/2022   HGB 10.5 (L) 08/27/2022   PLT 274 08/27/2022   NA 140 05/16/2019   K 4.4 05/16/2019   CL 102 05/16/2019   CO2 25 05/16/2019   GLUCOSE 83 05/16/2019   BUN 9 05/16/2019   CREATININE 0.75 05/16/2019   BILITOT 0.6 05/16/2019   ALKPHOS 65 05/16/2019   AST 18 05/16/2019   ALT 13 05/16/2019   PROT 7.6 05/16/2019   ALBUMIN 4.9 05/16/2019   CALCIUM 9.9 05/16/2019   GFRAA 125 05/16/2019    Speciality Comments: No specialty comments  available.  Procedures:  No procedures performed Allergies: Egg-derived products, Food, Red dye #40 (allura red), and Sulfa antibiotics   Assessment / Plan:     Visit Diagnoses: Positive ANA (antinuclear antibody) - Plan: Sedimentation rate, C-reactive protein, C3 and C4, ANA, RNP Antibody, Anti-Smith antibody, Sjogrens syndrome-A extractable nuclear antibody, Sjogrens syndrome-B extractable nuclear antibody, Anti-DNA antibody, double-stranded, Cyclic citrul peptide antibody, IgG Chronic joint pain and stiffness with elevated inflammatory markers and positive ANA. Differential includes rheumatoid arthritis and lupus but no synovitis no dactylitis present on exam today. Does have some hypermobility appears benign, consider sports medicine evaluation if workup negative. - Order blood tests for inflammatory markers and autoimmune indicators. - Consider x-rays of hands and feet for joint changes.  Bilateral foot pain - Plan: XR Foot 2 Views Right, XR Foot 2 Views Left, meloxicam  (MOBIC ) 15 MG tablet Bilateral hand pain - Plan: XR Hand 2 View Right, XR Hand 2 View Left, meloxicam  (MOBIC ) 15 MG tablet  Fibromyalgia Chronic joint and muscle pain, previously managed with Cymbalta . - Prescribe meloxicam  15 mg once daily for pain management.  Hashimoto's thyroiditis Hashimoto's thyroiditis, discussed sometimes can cause positive ANA due to antithyroperoxidase antibodies.  Intertrigo Recurrent intertrigo likely due to candidiasis, exacerbated by heat and humidity. - Recommend keeping areas dry and using powder or cream products.  Plantar fasciitis Pain consistent with plantar fasciitis, with a nodule on the Achilles tendon. - Recommend exercises including foot rolling and calf stretching.  Mitral valve prolapse Mild mitral valve prolapse with associated symptoms including palpitations and chest pain.  Cholestasis of pregnancy History of cholestasis during pregnancies with no current symptoms.         Orders: Orders Placed This Encounter  Procedures   XR Hand 2 View Right   XR Hand 2 View Left   XR Foot 2 Views Right   XR Foot 2 Views Left   Sedimentation rate   C-reactive protein   C3 and C4   ANA   RNP Antibody   Anti-Smith antibody   Sjogrens syndrome-A extractable nuclear antibody   Sjogrens syndrome-B extractable nuclear antibody   Anti-DNA antibody, double-stranded   Cyclic citrul peptide antibody, IgG   Anti-nuclear ab-titer (ANA titer)   Meds ordered this encounter  Medications   meloxicam  (MOBIC ) 15 MG tablet    Sig: Take 1 tablet (15 mg total) by mouth daily as needed for  pain.    Dispense:  30 tablet    Refill:  1     Follow-Up Instructions: No follow-ups on file.   Lonni LELON Ester, MD  Note - This record has been created using [employer].  Chart creation errors have been sought, but may not always  have been located. Such creation errors do not reflect on  the standard of medical care.

## 2024-02-09 ENCOUNTER — Encounter: Payer: Self-pay | Admitting: Internal Medicine

## 2024-02-09 ENCOUNTER — Ambulatory Visit: Payer: BC Managed Care – PPO | Attending: Internal Medicine | Admitting: Internal Medicine

## 2024-02-09 ENCOUNTER — Ambulatory Visit

## 2024-02-09 ENCOUNTER — Encounter: Payer: Self-pay | Admitting: Oncology

## 2024-02-09 VITALS — BP 103/71 | HR 83 | Resp 16 | Ht 60.5 in | Wt 139.0 lb

## 2024-02-09 DIAGNOSIS — R899 Unspecified abnormal finding in specimens from other organs, systems and tissues: Secondary | ICD-10-CM

## 2024-02-09 DIAGNOSIS — M79641 Pain in right hand: Secondary | ICD-10-CM

## 2024-02-09 DIAGNOSIS — E063 Autoimmune thyroiditis: Secondary | ICD-10-CM | POA: Diagnosis not present

## 2024-02-09 DIAGNOSIS — M79642 Pain in left hand: Secondary | ICD-10-CM

## 2024-02-09 DIAGNOSIS — R768 Other specified abnormal immunological findings in serum: Secondary | ICD-10-CM | POA: Diagnosis not present

## 2024-02-09 DIAGNOSIS — M79671 Pain in right foot: Secondary | ICD-10-CM

## 2024-02-09 DIAGNOSIS — M79672 Pain in left foot: Secondary | ICD-10-CM

## 2024-02-09 MED ORDER — MELOXICAM 15 MG PO TABS: 15.0000 mg | ORAL_TABLET | Freq: Every day | ORAL | 1 refills | Status: AC | PRN

## 2024-02-11 ENCOUNTER — Encounter: Payer: Self-pay | Admitting: Advanced Practice Midwife

## 2024-02-11 LAB — C3 AND C4
C3 Complement: 148 mg/dL (ref 83–193)
C4 Complement: 23 mg/dL (ref 15–57)

## 2024-02-11 LAB — ANTI-NUCLEAR AB-TITER (ANA TITER): ANA Titer 1: 1:160 {titer} — ABNORMAL HIGH

## 2024-02-11 LAB — ANA: Anti Nuclear Antibody (ANA): POSITIVE — AB

## 2024-02-11 LAB — ANTI-SMITH ANTIBODY: ENA SM Ab Ser-aCnc: <1 AI

## 2024-02-11 LAB — SJOGRENS SYNDROME-B EXTRACTABLE NUCLEAR ANTIBODY: SSB (La) (ENA) Antibody, IgG: <1 AI

## 2024-02-11 LAB — CYCLIC CITRUL PEPTIDE ANTIBODY, IGG: Cyclic Citrullin Peptide Ab: <16 U

## 2024-02-11 LAB — SJOGRENS SYNDROME-A EXTRACTABLE NUCLEAR ANTIBODY: SSA (Ro) (ENA) Antibody, IgG: <1 AI

## 2024-02-11 LAB — RNP ANTIBODY: Ribonucleic Protein(ENA) Antibody, IgG: <1 AI

## 2024-02-11 LAB — C-REACTIVE PROTEIN: CRP: <3 mg/L (ref <8.0)

## 2024-02-11 LAB — ANTI-DNA ANTIBODY, DOUBLE-STRANDED: ds DNA Ab: 2 [IU]/mL

## 2024-02-11 LAB — SEDIMENTATION RATE: Sed Rate: 22 mm/h — ABNORMAL HIGH (ref 0–20)

## 2024-02-21 ENCOUNTER — Encounter: Payer: Self-pay | Admitting: Oncology

## 2024-03-01 ENCOUNTER — Ambulatory Visit: Payer: Self-pay | Admitting: Internal Medicine

## 2024-03-15 ENCOUNTER — Ambulatory Visit: Attending: Internal Medicine | Admitting: Internal Medicine

## 2024-03-15 ENCOUNTER — Encounter: Payer: Self-pay | Admitting: Internal Medicine

## 2024-03-15 VITALS — BP 92/62 | HR 120 | Resp 16 | Ht 59.0 in | Wt 133.0 lb

## 2024-03-15 DIAGNOSIS — E063 Autoimmune thyroiditis: Secondary | ICD-10-CM | POA: Diagnosis not present

## 2024-03-15 DIAGNOSIS — M359 Systemic involvement of connective tissue, unspecified: Secondary | ICD-10-CM | POA: Diagnosis not present

## 2024-03-15 MED ORDER — HYDROXYCHLOROQUINE SULFATE 200 MG PO TABS: 200.0000 mg | ORAL_TABLET | Freq: Every day | ORAL | 2 refills | Status: AC

## 2024-03-15 NOTE — Progress Notes (Signed)
 Office Visit Note  Patient: Eileen  [name]             Date of Birth: [birth year]           MRN: [id number]             PCP: Patient, No Pcp Per Referring: No ref. provider found Visit Date: 03/15/2024   Subjective:  Follow-up (Lab results )   Discussed the use of AI scribe software for clinical note transcription with the patient, who gave verbal consent to proceed.  History of Present Illness   Eileen  [name] is a 33 year old female with suspected inflammatory arthritis along with chronic pain who presents with worsening leg pain and sleep disturbances.  She experiences severe leg pain, particularly at night, which disrupts her sleep. The pain is described as similar to 'growing pains' from her youth but more intense, causing her to frequently sit up during the night. Occasionally, her knees swell, and she can feel fluid on them.  She has a history of a positive ANA test and a slightly elevated sedimentation rate, which was normal during the last check but elevated previously. She experiences fatigue. She tried meloxicam  15 mg for her symptoms, but it caused significant stomach upset, leading her to discontinue its use after the second day. She has not experienced similar issues with Advil .  She experiences numbness in her feet when sitting for extended periods, such as when on the toilet. Her Achilles tendon hurts in the morning, causing her to limp, and the pain lasts throughout the workday. She describes her right lateral gastrocnemius as being 'super tight' compared to the left side.  Her current medications include Lexapro, Klonopin , and a recent prescription for hydroxyzine  for anxiety.        Previous HPI 02/09/24 Eileen  [name] is a 33 year old female who presents with joint pain and stiffness for evaluation of abnormal lab test results and movement issues.   She has experienced joint pain, stiffness, and movement issues for an extended period. Abnormal lab test results,  including elevated sedimentation rate and a positive ANA test, have been present since 2019. Despite these findings, she has not been on specific treatments for rheumatoid arthritis as her rheumatoid factor has been negative. She manages her joint and muscle pain with Advil  and Tylenol , taking up to 800 mg of Advil  twice daily.   She has a history of Hashimoto's disease and was previously on Cymbalta  for fibromyalgia and depression but discontinued it after a month due to adverse effects.   Significant pain in her legs, particularly from the hips down, affects her ability to walk long distances. She reports that her ankles and knees swell, and that they are more painful when swollen. She experiences morning stiffness, describing it as feeling like she 'ran a mile in her sleep', with throbbing pain in her feet upon standing.   She experiences heat rashes in areas where skin rubs together, such as her thighs and under her breasts, leading to scarring from itching. She occasionally experiences mouth ulcers, which resolve quickly with salt application.During her pregnancies, she experienced cholestasis with two of her children and was diagnosed with mitral valve prolapse during her first pregnancy. She experiences palpitations and occasional chest pain, which has been evaluated in the past without evidence of a heart attack.   She reports numbness in her feet, particularly when sitting, which resolves upon movement. A past nerve conduction study was performed, but she did not feel the shocks  during the test.   Her family history includes some autoimmune conditions on her mother's side, though specifics are unknown. Her father avoids medical consultations.    Review of Systems  Constitutional:  Positive for fatigue.  HENT:  Positive for mouth dryness. Negative for mouth sores.   Eyes:  Positive for dryness.  Respiratory:  Positive for shortness of breath.   Cardiovascular:  Positive for chest pain and  palpitations.  Gastrointestinal:  Positive for diarrhea. Negative for blood in stool and constipation.  Endocrine: Negative for increased urination.  Genitourinary:  Negative for involuntary urination.  Musculoskeletal:  Positive for joint pain, joint pain, joint swelling, myalgias, muscle weakness, morning stiffness, muscle tenderness and myalgias. Negative for gait problem.  Skin:  Positive for hair loss. Negative for color change, rash and sensitivity to sunlight.  Allergic/Immunologic: Positive for susceptible to infections.  Neurological:  Positive for headaches. Negative for dizziness.  Hematological:  Negative for swollen glands.  Psychiatric/Behavioral:  Positive for depressed mood and sleep disturbance. The patient is nervous/anxious.     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   Bilateral foot pain 02/09/2024   Bilateral hand pain 02/09/2024   Pelvic inflammatory disease 03/13/2021   Pelvic pain 03/13/2021   Abnormal uterine bleeding 03/13/2021   Ovulation pain 05/16/2019   Autoimmune thyroiditis 09/01/2018   Dysfunction of right eustachian tube 08/29/2018   Iron  deficiency anemia 05/18/2018   Lower urinary tract infectious disease 02/14/2018   Tachycardia 06/16/2016   Abnormal laboratory test result 04/07/2016   Tobacco abuse 04/07/2016   Depression with anxiety 11/01/2013   Anxiety 08/01/2013   Goiter 06/06/2007    Past Medical History:  Diagnosis Date   Abortion 08/04/2011   Anemia    Anxiety    Autoimmune thyroiditis 09/01/2018   Diagnosed by [facility]   Cholestasis during pregnancy    Chronic kidney disease    kidney infection.   Depression    Dysuria    Fibromyalgia    Fibromyalgia    GAD (generalized anxiety disorder) 01/2024   Hashimoto's disease 2006   Headache(784.0)    Hypothyroid    history of.   Major depressive disorder 01/2024   Mitral  valve prolapse    Neuropathy    PTSD (post-traumatic stress disorder) 01/2024   RA (rheumatoid arthritis) (HCC)     Family History  Problem Relation Age of Onset   Cancer Mother        SKIN   Hyperlipidemia Mother    Thyroid  disease Mother    Heart disease Mother    Mitral valve prolapse Mother    Heart attack Mother    COPD Mother    Asthma Brother    Bipolar disorder Brother    Diabetes Maternal Grandmother    Hyperlipidemia Maternal Grandmother    Emphysema Paternal Grandfather    Asthma Daughter    Healthy Daughter    Healthy Daughter    Healthy Son    Past Surgical History:  Procedure Laterality Date   DILATION AND CURETTAGE OF UTERUS     LAPAROSCOPY N/A 05/25/2014   Procedure: LAPAROSCOPY DIAGNOSTIC;  Surgeon: Gloris DELENA Hugger, MD;  Location: [facility];  Service: Gynecology;  Laterality: N/A;   THERAPEUTIC ABORTION N/A 2012   TUBAL LIGATION Bilateral 10/22/2016   Procedure: POST PARTUM TUBAL LIGATION;  Surgeon: Winton Felt, MD;  Location: [facility];  Service: Gynecology;  Laterality: Bilateral;   Social  History   Social History Narrative   Not on file   Immunization History  Administered Date(s) Administered   HPV Quadrivalent 06/06/2007, 08/16/2007, 11/16/2007   Tdap 03/30/2013, 08/25/2016     Objective: Vital Signs: BP 92/62 (BP Location: Left Arm, Patient Position: Sitting, Cuff Size: Normal)   Pulse (!) 120   Resp 16   Ht 4' 11 (1.499 m)   Wt 133 lb (60.3 kg)   LMP 02/27/2024   BMI 26.86 kg/m    Physical Exam Eyes:     Conjunctiva/sclera: Conjunctivae normal.  Cardiovascular:     Rate and Rhythm: Normal rate and regular rhythm.  Pulmonary:     Effort: Pulmonary effort is normal.     Breath sounds: Normal breath sounds.  Skin:    General: Skin is warm and dry.  Neurological:     Mental Status: She is alert.  Psychiatric:        Mood and Affect: Mood normal.      Musculoskeletal Exam:  Shoulders full ROM no tenderness or swelling Elbows  full ROM no tenderness or swelling Wrists full ROM no tenderness or swelling Fingers hyperextensibility extension > 90 degrees, no swelling Knees full ROM, tenderness to pressure at quadriceps tendon insertion on the superior border of patella no palpable nodule or swelling Ankles full ROM no tenderness or swelling, right achilles tendon insertion nodule likely cyst Tenderness to pressure on plantar side at MTPs and proximally  Investigation: No additional findings.  Imaging: No results found.  Recent Labs: Lab Results  Component Value Date   WBC 7.6 08/27/2022   HGB 10.5 (L) 08/27/2022   PLT 274 08/27/2022   NA 140 05/16/2019   K 4.4 05/16/2019   CL 102 05/16/2019   CO2 25 05/16/2019   GLUCOSE 83 05/16/2019   BUN 9 05/16/2019   CREATININE 0.75 05/16/2019   BILITOT 0.6 05/16/2019   ALKPHOS 65 05/16/2019   AST 18 05/16/2019   ALT 13 05/16/2019   PROT 7.6 05/16/2019   ALBUMIN 4.9 05/16/2019   CALCIUM 9.9 05/16/2019   GFRAA 125 05/16/2019    Speciality Comments: No specialty comments available.  Procedures:  No procedures performed Allergies: Egg-derived products, Food, Red dye #40 (allura red), and Sulfa antibiotics   Assessment / Plan:     Visit Diagnoses: Undifferentiated connective tissue disease (HCC) - Plan: hydroxychloroquine  (PLAQUENIL ) 200 MG tablet Chronic leg pain with intermittent knee effusion and right Achilles enthesitis but no other frank peripheral synovitis. X-rays normal, positive ANA, elevated sedimentation rate suggest systemic inflammation. Differential includes autoimmune conditions; working diagnosis is undifferentiated connective tissue disease though with a negative ANA test may be more appropriately seronegative RA. Hydroxychloroquine  considered for joint pain and swelling reduction without immunosuppression. - Start hydroxychloroquine  200 mg p.o. daily recommend 8 weeks trial or longer - Monitor symptoms and inflammation markers. - Consider  referral to sports medicine if symptoms and serology persist before pursuing any more aggressive immunomodulatory drug.  Autoimmune thyroiditis Discussed possible causes of spurious abnormal antibodies especially in the setting of known thyroid  disease.  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 Addressing underlying pain may alleviate insomnia.  Fatigue Fatigue associated with undifferentiated connective tissue disease. Expected to improve with management of underlying condition.        Orders: No orders of the defined types were placed in this encounter.  Meds ordered this encounter  Medications   hydroxychloroquine  (PLAQUENIL ) 200 MG tablet    Sig: Take 1  tablet (200 mg total) by mouth daily.    Dispense:  30 tablet    Refill:  2     Follow-Up Instructions: Return in about 2 months (around 05/15/2024) for ?UCTD HCQ start f/u 2mos.   Lonni LELON Ester, MD  Note - This record has been created using [employer].  Chart creation errors have been sought, but may not always  have been located. Such creation errors do not reflect on  the standard of medical care.

## 2024-04-11 IMAGING — DX DG CHEST 2V
2 series · 2 of 2 positions shown · non-contrast
Comparison: 06/15/2016

CLINICAL DATA: Hemoptysis, cough for 4 days

EXAM:
CHEST - 2 VIEW

[chest pa]
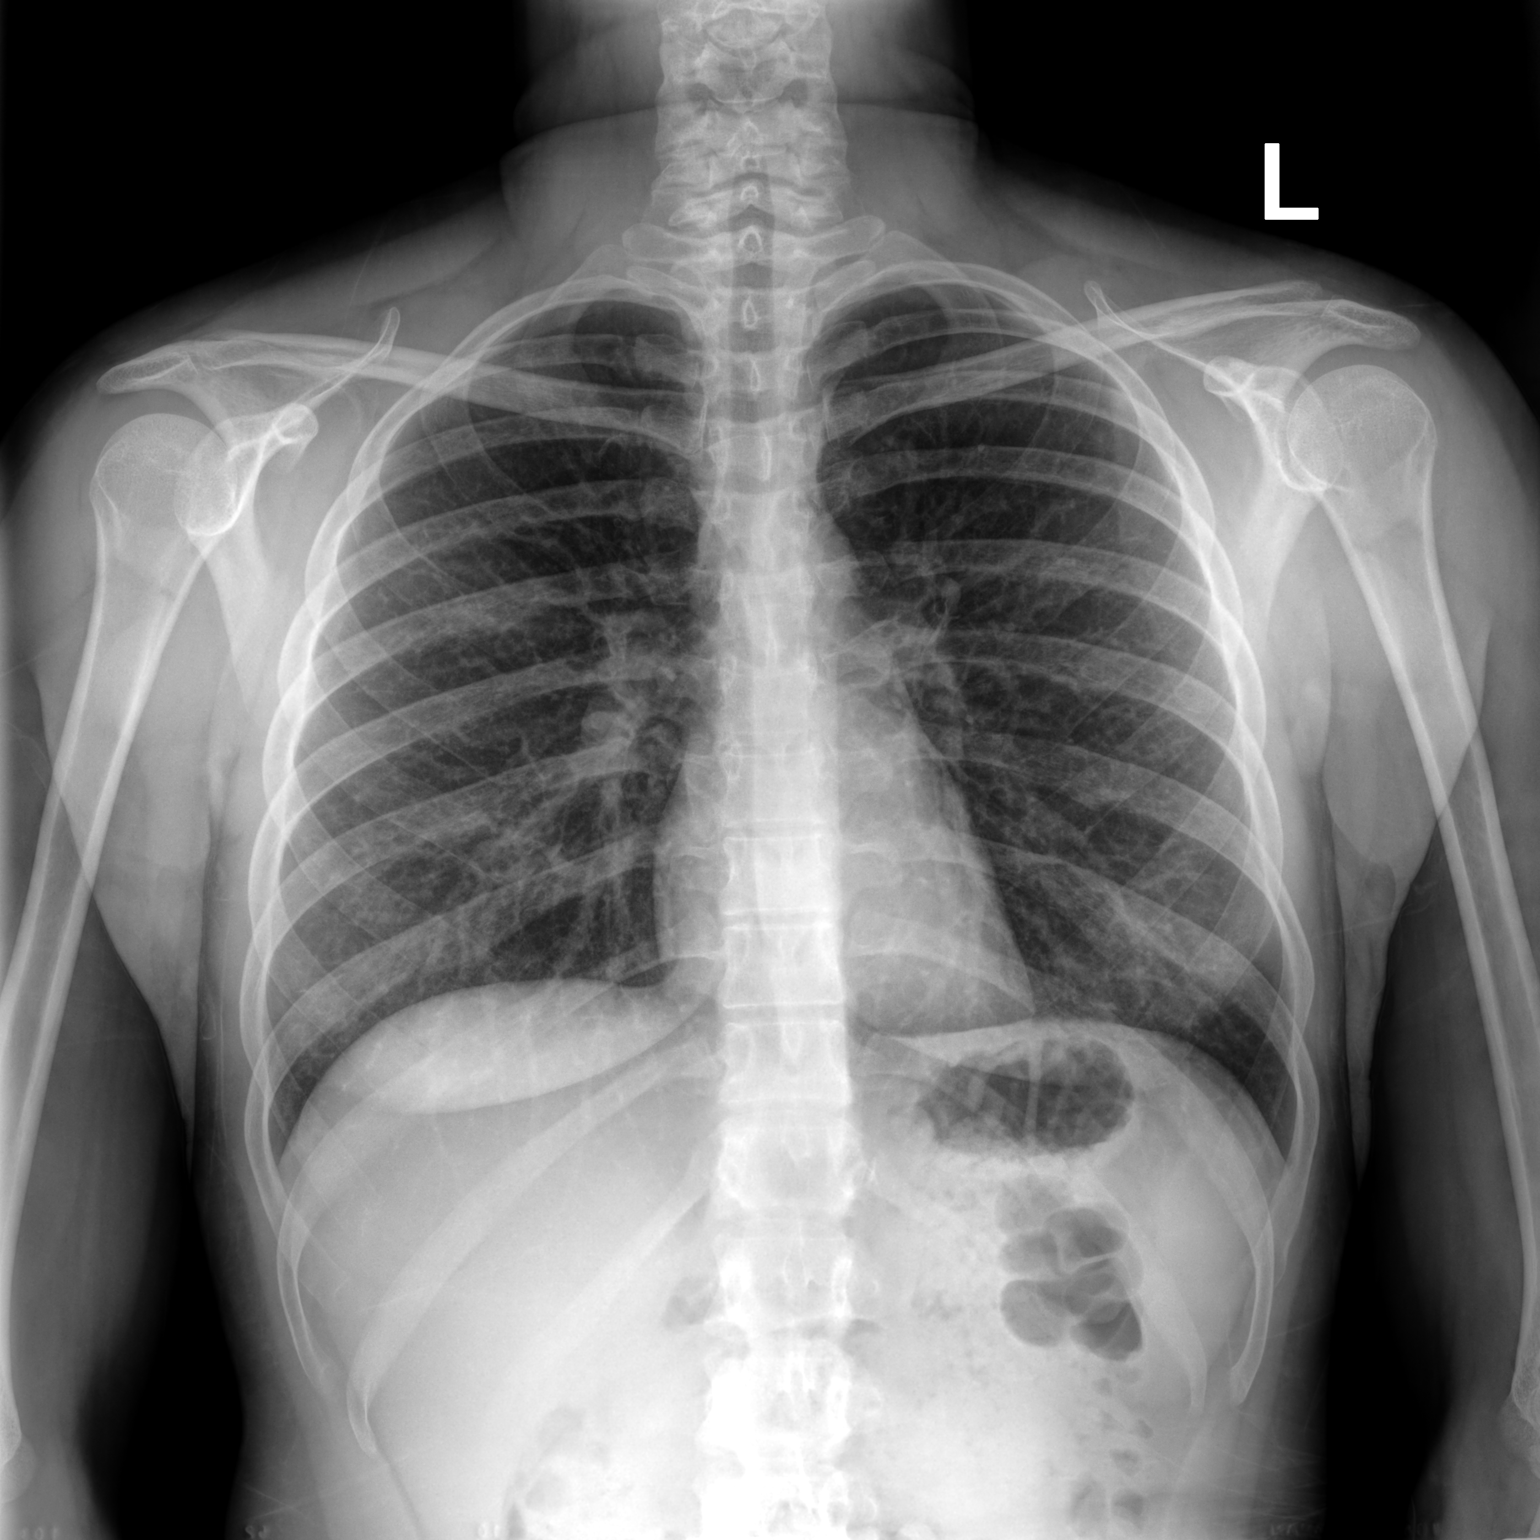

[chest lat]
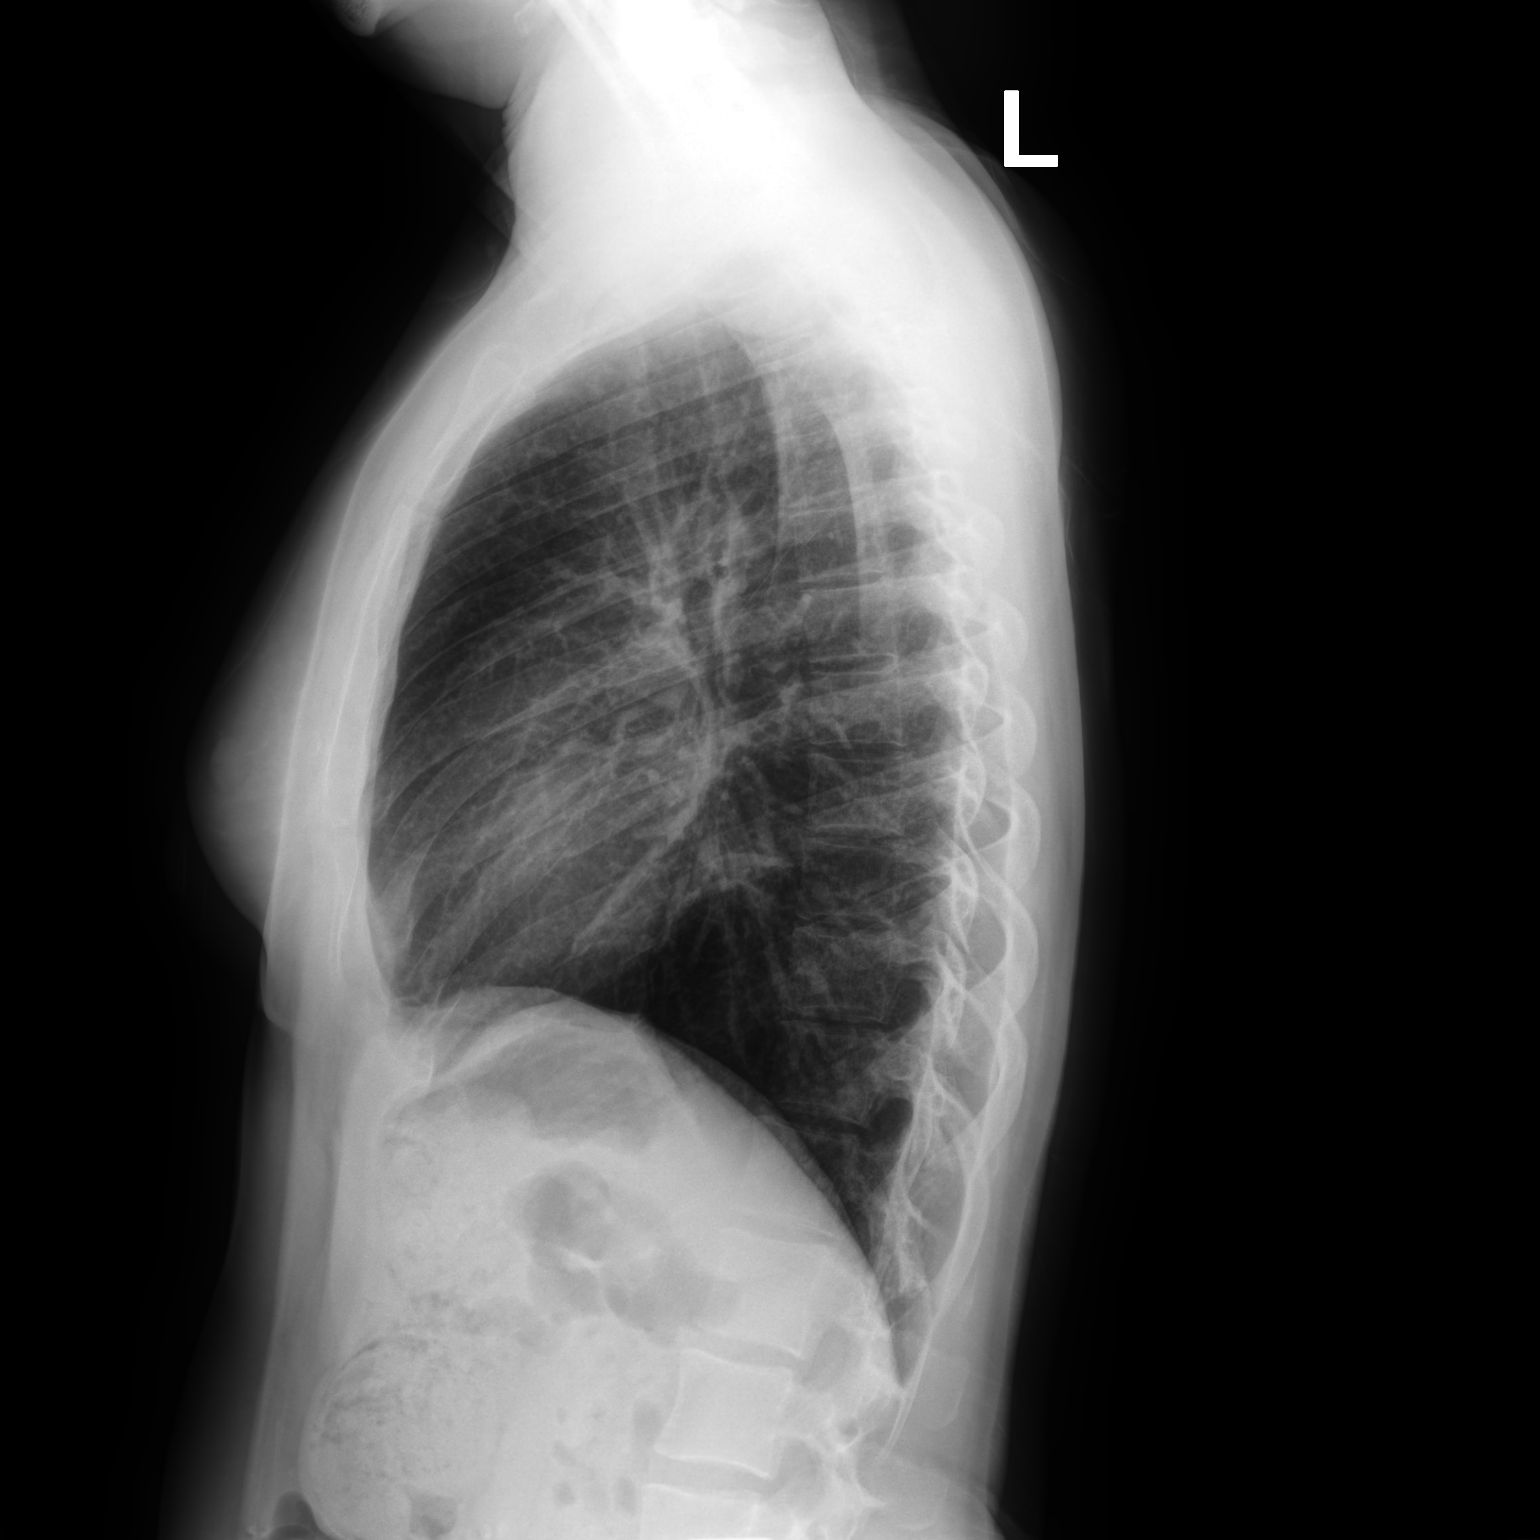

[2 of 2 positions shown; findings below may reference images not displayed]

FINDINGS: The heart size and mediastinal contours are within normal limits.
Both lungs are clear. The visualized skeletal structures are
unremarkable.
IMPRESSION: No active cardiopulmonary disease.

## 2024-05-24 NOTE — Progress Notes (Deleted)
 Office Visit Note  Patient: Eileen [name]             Date of Birth: 01/27/1991           MRN: [id number]             PCP: Patient, No Pcp Per Referring: No ref. provider found Visit Date: 06/06/2024   Subjective:  No chief complaint on file.   History of Present Illness: Eileen [name] is a 33 y.o. female here for follow up with suspected inflammatory arthritis along with chronic pain who presents with worsening leg pain and sleep disturbances.   Previous HPI 03/15/2024 Eileen [name] is a 33 year old female with suspected inflammatory arthritis along with chronic pain who presents with worsening leg pain and sleep disturbances.   She experiences severe leg pain, particularly at night, which disrupts her sleep. The pain is described as similar to 'growing pains' from her youth but more intense, causing her to frequently sit up during the night. Occasionally, her knees swell, and she can feel fluid on them.   She has a history of a positive ANA test and a slightly elevated sedimentation rate, which was normal during the last check but elevated previously. She experiences fatigue. She tried meloxicam  15 mg for her symptoms, but it caused significant stomach upset, leading her to discontinue its use after the second day. She has not experienced similar issues with Advil .   She experiences numbness in her feet when sitting for extended periods, such as when on the toilet. Her Achilles tendon hurts in the morning, causing her to limp, and the pain lasts throughout the workday. She describes her right lateral gastrocnemius as being 'super tight' compared to the left side.   Her current medications include Lexapro, Klonopin , and a recent prescription for hydroxyzine  for anxiety.         Previous HPI 02/09/24 Eileen [name] is a 33 year old female who presents with joint pain and stiffness for evaluation of abnormal lab test results and movement issues.   She has experienced joint pain,  stiffness, and movement issues for an extended period. Abnormal lab test results, including elevated sedimentation rate and a positive ANA test, have been present since 2019. Despite these findings, she has not been on specific treatments for rheumatoid arthritis as her rheumatoid factor has been negative. She manages her joint and muscle pain with Advil  and Tylenol , taking up to 800 mg of Advil  twice daily.   She has a history of Hashimoto's disease and was previously on Cymbalta  for fibromyalgia and depression but discontinued it after a month due to adverse effects.   Significant pain in her legs, particularly from the hips down, affects her ability to walk long distances. She reports that her ankles and knees swell, and that they are more painful when swollen. She experiences morning stiffness, describing it as feeling like she 'ran a mile in her sleep', with throbbing pain in her feet upon standing.   She experiences heat rashes in areas where skin rubs together, such as her thighs and under her breasts, leading to scarring from itching. She occasionally experiences mouth ulcers, which resolve quickly with salt application.During her pregnancies, she experienced cholestasis with two of her children and was diagnosed with mitral valve prolapse during her first pregnancy. She experiences palpitations and occasional chest pain, which has been evaluated in the past without evidence of a heart attack.   She reports numbness in her feet, particularly when sitting, which resolves  upon movement. A past nerve conduction study was performed, but she did not feel the shocks during the test.   Her family history includes some autoimmune conditions on her mother's side, though specifics are unknown. Her father avoids medical consultations.    No Rheumatology ROS completed.   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    Bilateral foot pain 02/09/2024   Bilateral hand pain 02/09/2024   Pelvic inflammatory disease 03/13/2021   Pelvic pain 03/13/2021   Abnormal uterine bleeding 03/13/2021   Ovulation pain 05/16/2019   Autoimmune thyroiditis 09/01/2018   Dysfunction of right eustachian tube 08/29/2018   Iron  deficiency anemia 05/18/2018   Lower urinary tract infectious disease 02/14/2018   Tachycardia 06/16/2016   Abnormal laboratory test result 04/07/2016   Tobacco abuse 04/07/2016   Depression with anxiety 11/01/2013   Anxiety 08/01/2013   Goiter 06/06/2007    Past Medical History:  Diagnosis Date   Abortion 08/04/2011   Anemia    Anxiety    Autoimmune thyroiditis 09/01/2018   Diagnosed by [facility]   Cholestasis during pregnancy    Chronic kidney disease    kidney infection.   Depression    Dysuria    Fibromyalgia    Fibromyalgia    GAD (generalized anxiety disorder) 01/2024   Hashimoto's disease 2006   Headache(784.0)    Hypothyroid    history of.   Major depressive disorder 01/2024   Mitral valve prolapse    Neuropathy    PTSD (post-traumatic stress disorder) 01/2024   RA (rheumatoid arthritis) (HCC)     Family History  Problem Relation Age of Onset   Cancer Mother        SKIN   Hyperlipidemia Mother    Thyroid  disease Mother    Heart disease Mother    Mitral valve prolapse Mother    Heart attack Mother    COPD Mother    Asthma Brother    Bipolar disorder Brother    Diabetes Maternal Grandmother    Hyperlipidemia Maternal Grandmother    Emphysema Paternal Grandfather    Asthma Daughter    Healthy Daughter    Healthy Daughter    Healthy Son    Past Surgical History:  Procedure Laterality Date   DILATION AND CURETTAGE OF UTERUS     LAPAROSCOPY N/A 05/25/2014   Procedure: LAPAROSCOPY DIAGNOSTIC;  Surgeon: Gloris DELENA Hugger, MD;  Location: [facility];  Service: Gynecology;  Laterality: N/A;   THERAPEUTIC ABORTION N/A 2012   TUBAL LIGATION Bilateral  10/22/2016   Procedure: POST PARTUM TUBAL LIGATION;  Surgeon: Winton Felt, MD;  Location: [facility];  Service: Gynecology;  Laterality: Bilateral;   Social History   Social History Narrative   Not on file   Immunization History  Administered Date(s) Administered   HPV Quadrivalent 06/06/2007, 08/16/2007, 11/16/2007   Tdap 03/30/2013, 08/25/2016     Objective: Vital Signs: There were no vitals taken for this visit.   Physical Exam   Musculoskeletal Exam: ***  CDAI Exam: CDAI Score: -- Patient Global: --; Provider Global: -- Swollen: --; Tender: -- Joint Exam 06/06/2024   No joint exam has been documented for this visit   There is currently no information documented on the homunculus. Go to the Rheumatology activity and complete the homunculus joint exam.  Investigation: No additional findings.  Imaging: No results found.  Recent Labs: Lab Results  Component Value Date   WBC 7.6 08/27/2022  HGB 10.5 (L) 08/27/2022   PLT 274 08/27/2022   NA 140 05/16/2019   K 4.4 05/16/2019   CL 102 05/16/2019   CO2 25 05/16/2019   GLUCOSE 83 05/16/2019   BUN 9 05/16/2019   CREATININE 0.75 05/16/2019   BILITOT 0.6 05/16/2019   ALKPHOS 65 05/16/2019   AST 18 05/16/2019   ALT 13 05/16/2019   PROT 7.6 05/16/2019   ALBUMIN 4.9 05/16/2019   CALCIUM 9.9 05/16/2019   GFRAA 125 05/16/2019    Speciality Comments: No specialty comments available.  Procedures:  No procedures performed Allergies: Egg protein-containing drug products, Food, Red dye #40 (allura red), and Sulfa antibiotics   Assessment / Plan:     Visit Diagnoses: No diagnosis found.  ***  Orders: No orders of the defined types were placed in this encounter.  No orders of the defined types were placed in this encounter.    Follow-Up Instructions: No follow-ups on file.   Hammond Obeirne M Khayden Herzberg, CMA  Note - This record has been created using Animal nutritionist.  Chart creation errors have been sought, but may  not always  have been located. Such creation errors do not reflect on  the standard of medical care.

## 2024-05-30 ENCOUNTER — Encounter: Payer: Self-pay | Admitting: Endocrinology

## 2024-05-30 ENCOUNTER — Ambulatory Visit: Admitting: Endocrinology

## 2024-05-30 ENCOUNTER — Other Ambulatory Visit

## 2024-05-30 VITALS — BP 96/52 | HR 98 | Ht 59.0 in | Wt 133.2 lb

## 2024-05-30 DIAGNOSIS — E041 Nontoxic single thyroid nodule: Secondary | ICD-10-CM

## 2024-05-30 DIAGNOSIS — E063 Autoimmune thyroiditis: Secondary | ICD-10-CM

## 2024-05-30 NOTE — Progress Notes (Unsigned)
 Outpatient Endocrinology Note Burch Marchuk, MD   Patient's Name: Eileen [name]    DOB: 1990/10/31    MRN: [id number]  REASON OF VISIT: New consult for thyroid  nodules / hypothyroidism  PCP: Patient, No Pcp Per  REFERRING PROVIDER:    HISTORY OF PRESENT ILLNESS:   Eileen [name] is a 33 y.o. old female with past medical history as listed below is presented for new consult for thyroid  nodules and hypothyroidism due to Hashimoto thyroiditis.  Pertinent Thyroid  History:  Patient is referred to endocrinology for evaluation and management of thyroid  disorder with hypothyroidism due to Hashimoto thyroiditis and thyroid  nodules.  Initial consult on May 30, 2024.  Patient reports she was diagnosed with hypothyroidism at the age of 7.  She was started on levothyroxine  after the diagnosis.  She reports she was on and off of levothyroxine  in the past.  With the review of the chart she had mostly elevated TSH in the range of 7-10, indicating mostly  hypothyroid including TSH was 48 in 2010.  At times she had normal TSH as well.  He had elevated thyroid  peroxidase antibody and thyroglobulin antibody consistent with having Hashimoto thyroiditis causing hypothyroidism.   Latest Reference Range & Units 06/28/14 09:54  Thyroglobulin Ab <2 IU/mL 4 (H)  Thyroperoxidase Ab SerPl-aCnc <9 IU/mL 575 (H)  (H): Data is abnormally high  Patient reports her usual levothyroxine  dose was 75 mcg daily.  She reports she had high and low thyroid  hormone levels , therefore she stopped taking levothyroxine  and has not been taking for about 4 months.  Referral medical records reviewed.  She had ultrasound thyroid  in March 2024, markedly heterogeneous thyroid  parenchyma right lobe measuring 5.2 x 1.6 x 2.0 cm and left lobe measuring 4.9 x 1.7 x 1.6 cm.  Isthmus nodule measuring 0.8 x 0.7 x 0.3 cm.  This ultrasound was compared with ultrasound in April 2021.  Ultrasound thyroid  on September 21, 2023: Right lobe  measuring 5.2 x 1.6 x 2.0 cm, left lobe measuring 4.9 x 1.7 x 1.6 cm and isthmus measuring 6-7 mm.  Heterogeneous thyroid  parenchyma.  Isthmus nodule measuring 7.2 x 4.0 x 10.8 mm and left thyroid  nodule measuring 2.1 x 1.1 x 1.0 cm.  No images of ultrasound available to review.  No family history of thyroid  cancer.  No radiation exposure to head and neck.   Interval history  Patient presented for evaluation and management of hypothyroidism and thyroid  nodules.  Initial visit today.  She used to be on levothyroxine , reports has not been taking for last 4 months.  She has noticed enlarging thyroid  nodule especially on the right side and with occasional neck discomfort.   REVIEW OF SYSTEMS:  As per history of present illness.   PAST MEDICAL HISTORY: Past Medical History:  Diagnosis Date   Abortion 08/04/2011   Anemia    Anxiety    Autoimmune thyroiditis 09/01/2018   Diagnosed by [facility]   Cholestasis during pregnancy    Chronic kidney disease    kidney infection.   Depression    Dysuria    Fibromyalgia    Fibromyalgia    GAD (generalized anxiety disorder) 01/2024   Hashimoto's disease 2006   Headache(784.0)    Hypothyroid    history of.   Major depressive disorder 01/2024   Mitral valve prolapse    Neuropathy    PTSD (post-traumatic stress disorder) 01/2024   RA (rheumatoid arthritis) (HCC)     PAST SURGICAL HISTORY: Past Surgical History:  Procedure Laterality Date   DILATION AND CURETTAGE OF UTERUS     LAPAROSCOPY N/A 05/25/2014   Procedure: LAPAROSCOPY DIAGNOSTIC;  Surgeon: Gloris DELENA Hugger, MD;  Location: [facility];  Service: Gynecology;  Laterality: N/A;   THERAPEUTIC ABORTION N/A 2012   TUBAL LIGATION Bilateral 10/22/2016   Procedure: POST PARTUM TUBAL LIGATION;  Surgeon: Winton Felt, MD;  Location: [facility];  Service: Gynecology;  Laterality: Bilateral;    ALLERGIES: Allergies  Allergen Reactions   Egg Protein-Containing Drug Products Other  (See Comments)    Stomach pain   Food Hives, Nausea And Vomiting and Other (See Comments)    Reaction to green beans    Red Dye #40 (Allura Red) Nausea And Vomiting   Sulfa Antibiotics Hives and Nausea And Vomiting    FAMILY HISTORY:  Family History  Problem Relation Age of Onset   Cancer Mother        SKIN   Hyperlipidemia Mother    Thyroid  disease Mother    Heart disease Mother    Mitral valve prolapse Mother    Heart attack Mother    COPD Mother    Asthma Brother    Bipolar disorder Brother    Diabetes Maternal Grandmother    Hyperlipidemia Maternal Grandmother    Emphysema Paternal Grandfather    Asthma Daughter    Healthy Daughter    Healthy Daughter    Healthy Son     SOCIAL HISTORY: Social History   Socioeconomic History   Marital status: Married    Spouse name: Not on file   Number of children: 4   Years of education: Not on file   Highest education level: Not on file  Occupational History   Not on file  Tobacco Use   Smoking status: Every Day    Current packs/day: 0.50    Average packs/day: 0.5 packs/day for 15.0 years (7.5 ttl pk-yrs)    Types: Cigarettes    Start date: 05/30/2009    Passive exposure: Current   Smokeless tobacco: Never  Vaping Use   Vaping status: Former  Substance and Sexual Activity   Alcohol use: Not Currently    Alcohol/week: 0.0 standard drinks of alcohol    Comment: occasional    Drug use: No   Sexual activity: Yes    Partners: Male    Birth control/protection: Surgical    Comment: tubal  Other Topics Concern   Not on file  Social History Narrative   Not on file   Social Drivers of Health   Financial Resource Strain: Not on file  Food Insecurity: Not on file  Transportation Needs: Not on file  Physical Activity: Not on file  Stress: Not on file  Social Connections: Not on file    MEDICATIONS:  Current Outpatient Medications  Medication Sig Dispense Refill   Acetaminophen  (TYLENOL  8 HOUR PO) Take by mouth.  (Patient taking differently: Take by mouth as needed.)     escitalopram (LEXAPRO) 5 MG tablet Take 5 mg by mouth.     hydroxychloroquine  (PLAQUENIL ) 200 MG tablet Take 1 tablet (200 mg total) by mouth daily. 30 tablet 2   hydrOXYzine  (VISTARIL ) 25 MG capsule Take 25 mg by mouth.     ibuprofen  (ADVIL ) 200 MG tablet Take 200 mg by mouth every 6 (six) hours as needed.     levothyroxine  (SYNTHROID ) 50 MCG tablet Take 1 tablet (50 mcg total) by mouth daily. 90 tablet 3   rosuvastatin (CRESTOR) 10 MG tablet Take 10 mg by mouth  daily.     albuterol  (VENTOLIN  HFA) 108 (90 Base) MCG/ACT inhaler Inhale 2 puffs into the lungs every 4 (four) hours as needed for wheezing or shortness of breath. (Patient not taking: Reported on 05/30/2024) 1 each 0   meloxicam  (MOBIC ) 15 MG tablet Take 1 tablet (15 mg total) by mouth daily as needed for pain. (Patient not taking: Reported on 05/30/2024) 30 tablet 1   No current facility-administered medications for this visit.    PHYSICAL EXAM: Vitals:   05/30/24 1348  BP: (!) 96/52  Pulse: 98  SpO2: 99%  Weight: 133 lb 3.2 oz (60.4 kg)  Height: 4' 11 (1.499 m)   Body mass index is 26.9 kg/m.  Wt Readings from Last 3 Encounters:  05/30/24 133 lb 3.2 oz (60.4 kg)  03/15/24 133 lb (60.3 kg)  02/09/24 139 lb (63 kg)     General: Well developed, well nourished female in no apparent distress.  HEENT: AT/Globe, no external lesions. Hearing intact to the spoken word Eyes: EOMI. No stare, proptosis. Conjunctiva clear and no icterus. Neck: Trachea midline, neck supple with appreciable mild thyromegaly or lymphadenopathy and no palpable thyroid  nodules Lungs: Clear to auscultation, no wheeze. Respirations not labored Heart: S1S2, Regular in rate and rhythm.  Abdomen: Soft, non tender, non distended Neurologic: Alert, oriented, normal speech, deep tendon biceps reflexes 1+,  no gross focal neurological deficit Extremities: No pedal pitting edema, no tremors of outstretched  hands Skin: Warm, color good. Dry skin. Psychiatric: Does not appear depressed or anxious  PERTINENT HISTORIC LABORATORY AND IMAGING STUDIES:  All pertinent laboratory results were reviewed. Please see HPI also for further details.   TSH  Date Value Ref Range Status  05/30/2024 6.67 (H) mIU/L Final    Comment:              Reference Range .           > or = 20 Years  0.40-4.50 .                Pregnancy Ranges           First trimester    0.26-2.66           Second trimester   0.55-2.73           Third trimester    0.43-2.91   08/27/2022 9.770 (H) 0.450 - 4.500 uIU/mL Final  11/28/2019 7.900 (H) 0.450 - 4.500 uIU/mL Final     ASSESSMENT / PLAN  1. Hypothyroidism due to Hashimoto's thyroiditis   2. Thyroid  nodule    Patient has longstanding history of hypothyroidism due to Hashimoto thyroiditis.  She had been mostly hypothyroid in the past, she was on and off of levothyroxine , in the past.  She reports her usual dose used to be 75 mcg daily.  She has not been taking levothyroxine  for last 4 months.  She is clinically hypothyroid in the clinic today.  She had ultrasound thyroid  in February 2025 with isthmus nodule measuring 1 cm and left thyroid  nodule measuring 2.1 cm, report scanned into media reviewed from referral records.  No images available to review.  Thyroid  parenchyma diffusely heterogeneous.  Patient reports lately enlarging thyroid  gland and some neck discomfort.  Discussed that she has hypothyroidism caused by Hashimoto thyroiditis which is an autoimmune disorder, no separate treatment for Hashimoto thyroiditis is required however we need to treat for hypothyroidism.  We discussed the medical need for compliance with levothyroxine  therapy, that it is a hormone necessary  for life, and that serious consequences may result from noncompliance. Discussed the proper method of levothyroxine  administration: take on an empty stomach in the morning, with water , waiting thirty  to sixty minutes before taking any other beverages or food. Also reviewed the need to take calcium or iron  supplements or multivitamin (that may contain iron  or calcium) at least 4 hours after levothyroxine  administration.  Plan: - Check thyroid  function test and restart levothyroxine  accordingly. - Discussed about compliance and proper way of taking levothyroxine  as above.  Advised patient that if she misses levothyroxine  she can take missed pill next day. - Check ultrasound thyroid  to monitor thyroid  nodules. - Rest of the plan based of the above results.   Eileen [name]  was seen today for establish care and thyroid  nodule.  Diagnoses and all orders for this visit:  Hypothyroidism due to Hashimoto's thyroiditis -     T4, free -     TSH -     levothyroxine  (SYNTHROID ) 50 MCG tablet; Take 1 tablet (50 mcg total) by mouth daily.  Thyroid  nodule -     US  THYROID ; Future   Labs reviewed: elevated TSH, consistent with hypothyroidism, will start levothyroxine  50 mcg daily. Will check lab on follow up visit in January.    Latest Reference Range & Units 05/30/24 15:17  TSH mIU/L 6.67 (H)  T4,Free(Direct) 0.8 - 1.8 ng/dL 0.9  (H): Data is abnormally high  DISPOSITION Follow up in clinic in 2 months suggested.  Labs today and on the same day of the visit.  All questions answered and patient verbalized understanding of the plan.  Eileen Pernice, MD [facility] Endocrinology [facility] [address] [place], [address] [place], [place] [address] Phone # [phone]  At least part of this note was generated using voice recognition software. Inadvertent word errors may have occurred, which were not recognized during the proofreading process.

## 2024-05-31 LAB — T4, FREE: Free T4: 0.9 ng/dL (ref 0.8–1.8)

## 2024-05-31 LAB — TSH: TSH: 6.67 m[IU]/L — ABNORMAL HIGH

## 2024-06-01 ENCOUNTER — Ambulatory Visit: Payer: Self-pay | Admitting: Endocrinology

## 2024-06-01 MED ORDER — LEVOTHYROXINE SODIUM 50 MCG PO TABS: 50.0000 ug | ORAL_TABLET | Freq: Every day | ORAL | 3 refills | Status: AC

## 2024-06-06 ENCOUNTER — Ambulatory Visit: Admitting: Internal Medicine

## 2024-06-06 DIAGNOSIS — M359 Systemic involvement of connective tissue, unspecified: Secondary | ICD-10-CM

## 2024-06-06 DIAGNOSIS — E063 Autoimmune thyroiditis: Secondary | ICD-10-CM

## 2024-06-13 ENCOUNTER — Ambulatory Visit
Admission: RE | Admit: 2024-06-13 | Discharge: 2024-06-13 | Disposition: A | Discharge status: Home / Self Care | Source: Ambulatory Visit | Attending: Endocrinology | Admitting: Endocrinology

## 2024-06-13 DIAGNOSIS — E041 Nontoxic single thyroid nodule: Secondary | ICD-10-CM

## 2024-08-01 ENCOUNTER — Ambulatory Visit: Admitting: Endocrinology

## 2024-09-12 ENCOUNTER — Ambulatory Visit: Admitting: Endocrinology
